# Patient Record
Sex: Male | Born: 1960 | Race: Black or African American | Hispanic: No | Marital: Single | Smoking: Former smoker
Health system: Southern US, Community
[De-identification: ages and names within clinical notes are randomized; demographics above are authoritative.]

## PROBLEM LIST (undated history)

## (undated) ENCOUNTER — Ambulatory Visit: Admission: EM | Source: Home / Self Care

## (undated) DIAGNOSIS — R7303 Prediabetes: Secondary | ICD-10-CM

## (undated) DIAGNOSIS — I4891 Unspecified atrial fibrillation: Secondary | ICD-10-CM

## (undated) DIAGNOSIS — I10 Essential (primary) hypertension: Secondary | ICD-10-CM

## (undated) DIAGNOSIS — I509 Heart failure, unspecified: Secondary | ICD-10-CM

## (undated) HISTORY — DX: Prediabetes: R73.03

## (undated) HISTORY — DX: Heart failure, unspecified: I50.9

---

## 2006-10-13 DIAGNOSIS — I4821 Permanent atrial fibrillation: Secondary | ICD-10-CM | POA: Insufficient documentation

## 2006-12-19 ENCOUNTER — Emergency Department (HOSPITAL_COMMUNITY): Admission: EM | Admit: 2006-12-19 | Discharge: 2006-12-19 | Payer: Self-pay | Admitting: Emergency Medicine

## 2011-06-05 DIAGNOSIS — R0789 Other chest pain: Secondary | ICD-10-CM | POA: Insufficient documentation

## 2011-06-05 DIAGNOSIS — I10 Essential (primary) hypertension: Secondary | ICD-10-CM | POA: Insufficient documentation

## 2011-06-05 DIAGNOSIS — I482 Chronic atrial fibrillation, unspecified: Secondary | ICD-10-CM | POA: Diagnosis present

## 2013-03-13 DIAGNOSIS — M778 Other enthesopathies, not elsewhere classified: Secondary | ICD-10-CM | POA: Insufficient documentation

## 2013-09-18 DIAGNOSIS — M25519 Pain in unspecified shoulder: Secondary | ICD-10-CM | POA: Insufficient documentation

## 2014-03-18 DIAGNOSIS — M19019 Primary osteoarthritis, unspecified shoulder: Secondary | ICD-10-CM | POA: Insufficient documentation

## 2015-01-02 DIAGNOSIS — M7542 Impingement syndrome of left shoulder: Secondary | ICD-10-CM | POA: Insufficient documentation

## 2019-11-19 DIAGNOSIS — R42 Dizziness and giddiness: Secondary | ICD-10-CM | POA: Insufficient documentation

## 2019-11-19 DIAGNOSIS — Z9189 Other specified personal risk factors, not elsewhere classified: Secondary | ICD-10-CM | POA: Insufficient documentation

## 2019-11-19 DIAGNOSIS — W010XXA Fall on same level from slipping, tripping and stumbling without subsequent striking against object, initial encounter: Secondary | ICD-10-CM | POA: Insufficient documentation

## 2021-10-21 ENCOUNTER — Other Ambulatory Visit: Payer: Self-pay

## 2021-10-21 ENCOUNTER — Emergency Department (HOSPITAL_COMMUNITY): Payer: Self-pay

## 2021-10-21 ENCOUNTER — Emergency Department (HOSPITAL_COMMUNITY)
Admission: EM | Admit: 2021-10-21 | Discharge: 2021-10-21 | Disposition: A | Discharge status: Home / Self Care | Payer: Self-pay | Attending: Emergency Medicine | Admitting: Emergency Medicine

## 2021-10-21 DIAGNOSIS — W010XXA Fall on same level from slipping, tripping and stumbling without subsequent striking against object, initial encounter: Secondary | ICD-10-CM | POA: Insufficient documentation

## 2021-10-21 DIAGNOSIS — Y92838 Other recreation area as the place of occurrence of the external cause: Secondary | ICD-10-CM | POA: Insufficient documentation

## 2021-10-21 DIAGNOSIS — M25562 Pain in left knee: Secondary | ICD-10-CM | POA: Diagnosis present

## 2021-10-21 DIAGNOSIS — M1710 Unilateral primary osteoarthritis, unspecified knee: Secondary | ICD-10-CM

## 2021-10-21 DIAGNOSIS — W19XXXA Unspecified fall, initial encounter: Secondary | ICD-10-CM

## 2021-10-21 DIAGNOSIS — M179 Osteoarthritis of knee, unspecified: Secondary | ICD-10-CM | POA: Insufficient documentation

## 2021-10-21 DIAGNOSIS — Y9301 Activity, walking, marching and hiking: Secondary | ICD-10-CM | POA: Insufficient documentation

## 2021-10-21 DIAGNOSIS — M25561 Pain in right knee: Secondary | ICD-10-CM | POA: Diagnosis not present

## 2021-10-21 MED ORDER — DICLOFENAC SODIUM 1 % EX GEL: 2.0000 g | Freq: Four times a day (QID) | CUTANEOUS | 0 refills | Status: DC

## 2021-10-21 NOTE — ED Triage Notes (Signed)
Pt reports mechanical fall onto bil knees approx 1 week ago. Reports pain to bil knees L>R. Pt also with edema to L knee.  ?

## 2021-10-21 NOTE — ED Notes (Signed)
Ortho tech paged for knee brace and crutches ?

## 2021-10-21 NOTE — Discharge Instructions (Signed)
Your history, exam, and work-up today are consistent with chronic bilateral knee arthritis that was exacerbated in the setting of acute trauma from the fall.  There was no evidence of acute fracture on imaging.  Your exam was overall reassuring but she did have more tenderness on the left side than the right.  Please use the knee sleeve and crutches and you may also use the Voltaren gel as we discussed.  Please use over-the-counter anti-inflammatory medication and follow-up with orthopedics  and PCP.  If any symptoms change or worsen acutely, please return to the nearest Emergency Department ?

## 2021-10-21 NOTE — ED Provider Triage Note (Signed)
Emergency Medicine Provider Triage Evaluation Note ? ?Riley Jefferson , a 61 y.o. male  was evaluated in triage.  Pt complains of left knee swelling.  He states that about a week and a half ago he slipped and fell on both knees.  He says that ever since then, he has had significant left knee swelling and has been hopping around.  He has had previous injury to the left knee when he was a teenager for from a basketball injury.  Initially did not come in because of some insurance problems.  He denies any fevers or chills.  He also complains of right knee pain, however it is not nearly as bad as the left side. ? ?Review of Systems  ?Positive:  ?Negative:  ? ?Physical Exam  ?BP (!) 144/99 (BP Location: Right Arm)   Pulse 86   Temp 98.2 ?F (36.8 ?C) (Oral)   Resp 18   SpO2 97%  ?Gen:   Awake, no distress   ?Resp:  Normal effort  ?MSK:   Moves extremities without difficulty  ?Other:  Left knee swelling with previous surgical scar. No redness or warmth. Tender to touch of anterior medial side. Neurovascularly intact.  ? ?Medical Decision Making  ?Medically screening exam initiated at 6:09 PM.  Appropriate orders placed.  Saul Fabiano was informed that the remainder of the evaluation will be completed by another provider, this initial triage assessment does not replace that evaluation, and the importance of remaining in the ED until their evaluation is complete. ? ? ?  ?Claudie Leach, PA-C ?10/21/21 1811 ? ?

## 2021-10-21 NOTE — ED Provider Notes (Signed)
?[facility] EMERGENCY DEPARTMENT ?Provider Note ? ? ?CSN: [id number] ?Arrival date & time: 10/21/21  1741 ? ?  ? ?History ? ?Chief Complaint  ?Patient presents with  ? Knee Pain  ? ? ?Riley Jefferson is a 61 y.o. male. ? ?The history is provided by the patient and medical records. No language interpreter was used.  ?Knee Pain ?Location:  Knee ?Time since incident:  1 week ?Injury: yes   ?Mechanism of injury: fall   ?Fall:  ?  Fall occurred:  Standing ?Knee location:  R knee and L knee ?Pain details:  ?  Quality:  Aching ?  Radiates to:  Does not radiate ?  Severity:  Moderate ?  Timing:  Constant ?  Progression:  Waxing and waning ?Chronicity:  Recurrent ?Tetanus status:  Unknown ?Prior injury to area:  No ?Relieved by:  Nothing ?Worsened by:  Bearing weight ?Ineffective treatments:  None tried ?Associated symptoms: swelling   ?Associated symptoms: no back pain, no decreased ROM, no fatigue, no fever, no itching, no muscle weakness, no neck pain, no numbness, no stiffness and no tingling   ? ?  ? ?Home Medications ?Prior to Admission medications   ?Not on File  ?   ? ?Allergies    ?Patient has no known allergies.   ? ?Review of Systems   ?Review of Systems  ?Constitutional:  Negative for chills, fatigue and fever.  ?HENT:  Negative for congestion.   ?Respiratory:  Negative for cough, chest tightness, shortness of breath and wheezing.   ?Cardiovascular:  Negative for chest pain, palpitations and leg swelling (knee swelling presnet but not calf or leg).  ?Gastrointestinal:  Negative for abdominal pain, constipation, diarrhea, nausea and vomiting.  ?Genitourinary:  Negative for flank pain and frequency.  ?Musculoskeletal:  Negative for back pain, neck pain, neck stiffness and stiffness.  ?Skin:  Negative for itching, rash and wound.  ?Neurological:  Negative for light-headedness and headaches.  ?Psychiatric/Behavioral:  Negative for agitation and confusion.   ?All other systems reviewed and are  negative. ? ?Physical Exam ?Updated Vital Signs ?BP (!) 159/78 (BP Location: Right Arm)   Pulse 67   Temp 98 ?F (36.7 ?C) (Oral)   Resp 18   Ht 6\' 4"  (1.93 m)   Wt 129.3 kg   SpO2 99%   BMI 34.69 kg/m?  ?Physical Exam ?Vitals and nursing note reviewed.  ?Constitutional:   ?   General: He is not in acute distress. ?   Appearance: He is well-developed. He is not ill-appearing, toxic-appearing or diaphoretic.  ?HENT:  ?   Head: Normocephalic and atraumatic.  ?   Nose: Nose normal.  ?   Mouth/Throat:  ?   Mouth: Mucous membranes are moist.  ?Eyes:  ?   Conjunctiva/sclera: Conjunctivae normal.  ?   Pupils: Pupils are equal, round, and reactive to light.  ?Cardiovascular:  ?   Rate and Rhythm: Normal rate and regular rhythm.  ?   Heart sounds: No murmur heard. ?Pulmonary:  ?   Effort: Pulmonary effort is normal. No respiratory distress.  ?   Breath sounds: Normal breath sounds. No wheezing, rhonchi or rales.  ?Chest:  ?   Chest wall: No tenderness.  ?Abdominal:  ?   General: Abdomen is flat.  ?   Palpations: Abdomen is soft.  ?   Tenderness: There is no abdominal tenderness. There is no right CVA tenderness, left CVA tenderness, guarding or rebound.  ?Musculoskeletal:     ?   General:  Swelling and tenderness present.  ?   Cervical back: Neck supple. No tenderness.  ?   Right knee: No swelling or deformity. Tenderness present.  ?   Left knee: Swelling present. No deformity. Tenderness present.  ?   Right lower leg: No tenderness. No edema.  ?   Left lower leg: No tenderness. No edema.  ?   Comments: Bilateral knee tenderness left worse than right.  Left knee was slightly swollen compared to right.  No lacerations seen.  No redness or warmth.  Intact sensation, strength, and pulses.  No hip tenderness.  Exam otherwise unremarkable.   ?Skin: ?   General: Skin is warm and dry.  ?   Capillary Refill: Capillary refill takes less than 2 seconds.  ?   Findings: No erythema or rash.  ?Neurological:  ?   General: No focal  deficit present.  ?   Mental Status: He is alert.  ?   Sensory: No sensory deficit.  ?   Motor: No weakness.  ?Psychiatric:     ?   Mood and Affect: Mood normal.  ? ? ?ED Results / Procedures / Treatments   ?Labs ?(all labs ordered are listed, but only abnormal results are displayed) ?Labs Reviewed - No data to display ? ?EKG ?None ? ?Radiology ?DG Knee Complete 4 Views Left ? ?Result Date: 10/21/2021 ?CLINICAL DATA:  Larey Seat, left knee pain, edema EXAM: LEFT KNEE - COMPLETE 4+ VIEW COMPARISON:  None. FINDINGS: Frontal, bilateral oblique, and lateral views of the left knee are obtained. No fracture, subluxation, or dislocation. Distal margin of a femoral intramedullary rod is identified. There is mild 3 compartmental osteoarthritis greatest in the medial compartment. No joint effusion. Soft tissues are unremarkable. IMPRESSION: 1. Mild 3 compartmental osteoarthritis. 2. No acute fracture. Electronically Signed   By: Sharlet Salina M.D.   On: 10/21/2021 19:06  ? ?DG Knee Complete 4 Views Right ? ?Result Date: 10/21/2021 ?CLINICAL DATA:  Pain, recent fall EXAM: RIGHT KNEE - COMPLETE 4+ VIEW COMPARISON:  None. FINDINGS: No recent fracture or dislocation is seen. There is no significant effusion in the suprapatellar bursa. Degenerative changes are noted with bony spurs in medial, lateral and patellofemoral compartments. There is chondrocalcinosis in the meniscal cartilages. IMPRESSION: No recent fracture or dislocation is seen in the right knee. Degenerative changes are noted with bony spurs and chondrocalcinosis. Electronically Signed   By: Ernie Avena M.D.   On: 10/21/2021 19:06   ? ?Procedures ?Procedures  ? ? ?Medications Ordered in ED ?Medications - No data to display ? ?ED Course/ Medical Decision Making/ A&P ?  ?                        ?Medical Decision Making ? ?Riley Jefferson is a 61 y.o. male with no significant past medical history who presents with bilateral knee pain after a fall.  According to patient,  but a week and a half ago he tripped fall walking in both his knees to the ground.  He reports she has had pain ever since with left worse than right.  He reports his still able to walk and denies any numbness, tingling, or weakness.  Denies any loss of bowel or bladder control.  Reports the pain is moderate to severe and he wants to make sure nothing is broken.  He has tried some over-the-counter medication to help with inflammation but has not had persistent relief.  Denies any other injuries or other. ? ?  Denies any fevers, chills, warmth, redness, or infection type symptoms in the skin or extremities.  Denies any chest pain, shortness of breath, cough, constipation, diarrhea, or urinary symptoms. ? ?On exam, patient does have tenderness to bilateral knees left worse than right.  He reports he is able to ambulate.  He did have more pain with active knee movement as compared to passive movement.  It was not warm.  No erythema.  Intact sensation, strength, and pulses distally.  Hips nontender.  Lungs clear and chest nontender.  Exam otherwise unremarkable.  No tenderness in the calves or swelling seen distally. ? ?Patient had x-rays in triage which showed no acute fractures.  Arthritis seen in both knees.  Based on his exam, low suspicion for DVTs at this time. ? ?We had a shared decision-making conversation.  Given about 10 days since his injury with her symptoms symptoms, we do feel is reasonable to have him follow-up with an orthopedist.  We also felt it was reasonable to give a prescription for some Voltaren gel to try and help with the inflammation and pain. ? ?Patient felt this was reasonable plan and he will rest on them and follow-up.  We also give him a knee sleeve and crutches to try to stay off the left knee as it is hurting worse. ? ?He understood return precautions and follow-up instructions and had no other questions or concerns.  Patient discharged in good condition. ? ? ? ? ? ? ? ? ?Final Clinical  Impression(s) / ED Diagnoses ?Final diagnoses:  ?Acute pain of both knees  ?Fall, initial encounter  ?Arthritis of knee  ? ? ?Rx / DC Orders ?ED Discharge Orders   ? ?      Ordered  ?  diclofenac Sodium (VOLTAREN) 1 % G

## 2021-10-21 NOTE — Progress Notes (Signed)
Orthopedic Tech Progress Note ?Patient Details:  ?Riley Jefferson ?May 04, 1961 ?[id number] ? ?Ortho Devices ?Type of Ortho Device: Knee Sleeve, Crutches ?Ortho Device/Splint Location: lle ?Ortho Device/Splint Interventions: Ordered ? Dropped brace off in pt rm pt didn't want me to apply the knee brace said he will do it. ?  ? ?Edwina Barth ?10/21/2021, 8:39 PM ? ?

## 2022-03-23 ENCOUNTER — Encounter (HOSPITAL_BASED_OUTPATIENT_CLINIC_OR_DEPARTMENT_OTHER): Payer: Self-pay

## 2022-03-23 DIAGNOSIS — I10 Essential (primary) hypertension: Secondary | ICD-10-CM | POA: Insufficient documentation

## 2022-03-23 NOTE — ED Notes (Signed)
Attempted to draw blood from pt x 2 unsuccessful

## 2022-03-23 NOTE — ED Triage Notes (Signed)
States he was at work had a nosebleed and was found to be hypertensive, [facility] and was sent here for further evaluation. Nose bleed has stopped  States he is not [facility] now and admits to stopping his BP meds

## 2022-03-24 ENCOUNTER — Emergency Department (HOSPITAL_BASED_OUTPATIENT_CLINIC_OR_DEPARTMENT_OTHER)
Admission: EM | Admit: 2022-03-24 | Discharge: 2022-03-24 | Disposition: A | Discharge status: Home / Self Care | Payer: Medicaid - Out of State | Attending: Emergency Medicine | Admitting: Emergency Medicine

## 2022-03-24 DIAGNOSIS — R040 Epistaxis: Secondary | ICD-10-CM

## 2022-03-24 DIAGNOSIS — I10 Essential (primary) hypertension: Secondary | ICD-10-CM

## 2022-03-24 HISTORY — DX: Essential (primary) hypertension: I10

## 2022-03-24 HISTORY — DX: Unspecified atrial fibrillation: I48.91

## 2022-03-24 MED ORDER — METOPROLOL SUCCINATE ER 50 MG PO TB24: 50.0000 mg | ORAL_TABLET | Freq: Every day | ORAL | 0 refills | Status: DC

## 2022-03-24 MED ORDER — LISINOPRIL 10 MG PO TABS: 10.0000 mg | ORAL_TABLET | Freq: Every day | ORAL | 0 refills | Status: DC

## 2022-03-24 NOTE — Discharge Instructions (Signed)
Begin taking lisinopril and metoprolol as previously prescribed.  Refills of these medications have been provided during this ER visit.  Keep a record of your blood pressures and take this with you to you your next primary doctor's appointment.  Return to the ER if symptoms significantly worsen or change.

## 2022-03-24 NOTE — ED Provider Notes (Signed)
[facility] EMERGENCY DEPT Provider Note   CSN: [id number] Arrival date & time: 03/23/22  1951     History  Chief Complaint  Patient presents with   Hypertension    Riley Jefferson is a 61 y.o. male.  Patient is a 61 year old male with history of hypertension presenting with complaints of nosebleed and elevated blood pressure.  He was at work this evening when his nose began to bleed in the absence of any injury or trauma.  It bled for approximately 1 minute, then he was able to get it stopped by putting a tissue in his nose.  He checked his blood pressure and it was 175/100.  His boss urged him to come to the ER to be evaluated.  Patient denies to me he is having any chest pain.  He has no other complaints.  He does report being off of his blood pressure medications for the past 2 months since relocating here from [facility] and not having a primary doctor in the area.  The history is provided by the patient.       Home Medications Prior to Admission medications   Medication Sig Start Date End Date Taking? Authorizing Provider  diclofenac Sodium (VOLTAREN) 1 % GEL Apply 2 g topically 4 (four) times daily. 10/21/21   Tegeler, Canary Brim, MD      Allergies    Patient has no known allergies.    Review of Systems   Review of Systems  All other systems reviewed and are negative.   Physical Exam Updated Vital Signs BP (!) 154/105   Pulse 70   Temp 97.6 F (36.4 C)   Resp 18   Ht 6\' 4"  (1.93 m)   Wt 131.5 kg   SpO2 100%   BMI 35.30 kg/m  Physical Exam Vitals and nursing note reviewed.  Constitutional:      General: He is not in acute distress.    Appearance: He is well-developed. He is not diaphoretic.  HENT:     Head: Normocephalic and atraumatic.     Nose:     Comments: Nares are clear.  Septum is midline.  There is no bleeding. Cardiovascular:     Rate and Rhythm: Normal rate and regular rhythm.     Heart sounds: No murmur heard.    No friction  rub.  Pulmonary:     Effort: Pulmonary effort is normal. No respiratory distress.     Breath sounds: Normal breath sounds. No wheezing or rales.  Abdominal:     General: Bowel sounds are normal. There is no distension.     Palpations: Abdomen is soft.     Tenderness: There is no abdominal tenderness.  Musculoskeletal:        General: Normal range of motion.     Cervical back: Normal range of motion and neck supple.  Skin:    General: Skin is warm and dry.  Neurological:     Mental Status: He is alert and oriented to person, place, and time.     Coordination: Coordination normal.     ED Results / Procedures / Treatments   Labs (all labs ordered are listed, but only abnormal results are displayed) Labs Reviewed  CBC WITH DIFFERENTIAL/PLATELET  COMPREHENSIVE METABOLIC PANEL  TROPONIN I (HIGH SENSITIVITY)  TROPONIN I (HIGH SENSITIVITY)    EKG EKG Interpretation  Date/Time:  Monday March 23 2022 20:30:43 EDT Ventricular Rate:  87 PR Interval:    QRS Duration: 124 QT Interval:  386 QTC Calculation:  464 R Axis:   -14 Text Interpretation: Atrial fibrillation Left ventricular hypertrophy with QRS widening Cannot rule out Septal infarct , age undetermined Abnormal ECG Confirmed by Geoffery Lyons ([id number]) on 03/24/2022 2:16:30 AM  Radiology No results found.  Procedures Procedures    Medications Ordered in ED Medications - No data to display  ED Course/ Medical Decision Making/ A&P  Patient presenting here with complaints of nosebleed and elevated blood pressure.  He has been here for over 6 hours and has had no further bleeding.  Most recent blood pressure is 154/105.  I do not feel as though any work-up is indicated at this time.  Patient will be discharged with prescriptions for his metoprolol and lisinopril he has not been taking for the past 2 months.  He is to follow-up with a primary doctor locally and return to the ER for any problems.  Final Clinical Impression(s)  / ED Diagnoses Final diagnoses:  None    Rx / DC Orders ED Discharge Orders     None         Geoffery Lyons, MD 03/24/22 [phone]

## 2022-03-28 IMAGING — DX DG KNEE COMPLETE 4+V*L*
4 series · 4 of 4 positions shown · non-contrast
Comparison: None.

CLINICAL DATA: Fell, left knee pain, edema

EXAM:
LEFT KNEE - COMPLETE 4+ VIEW

[t knee ap left (1 of 2)]
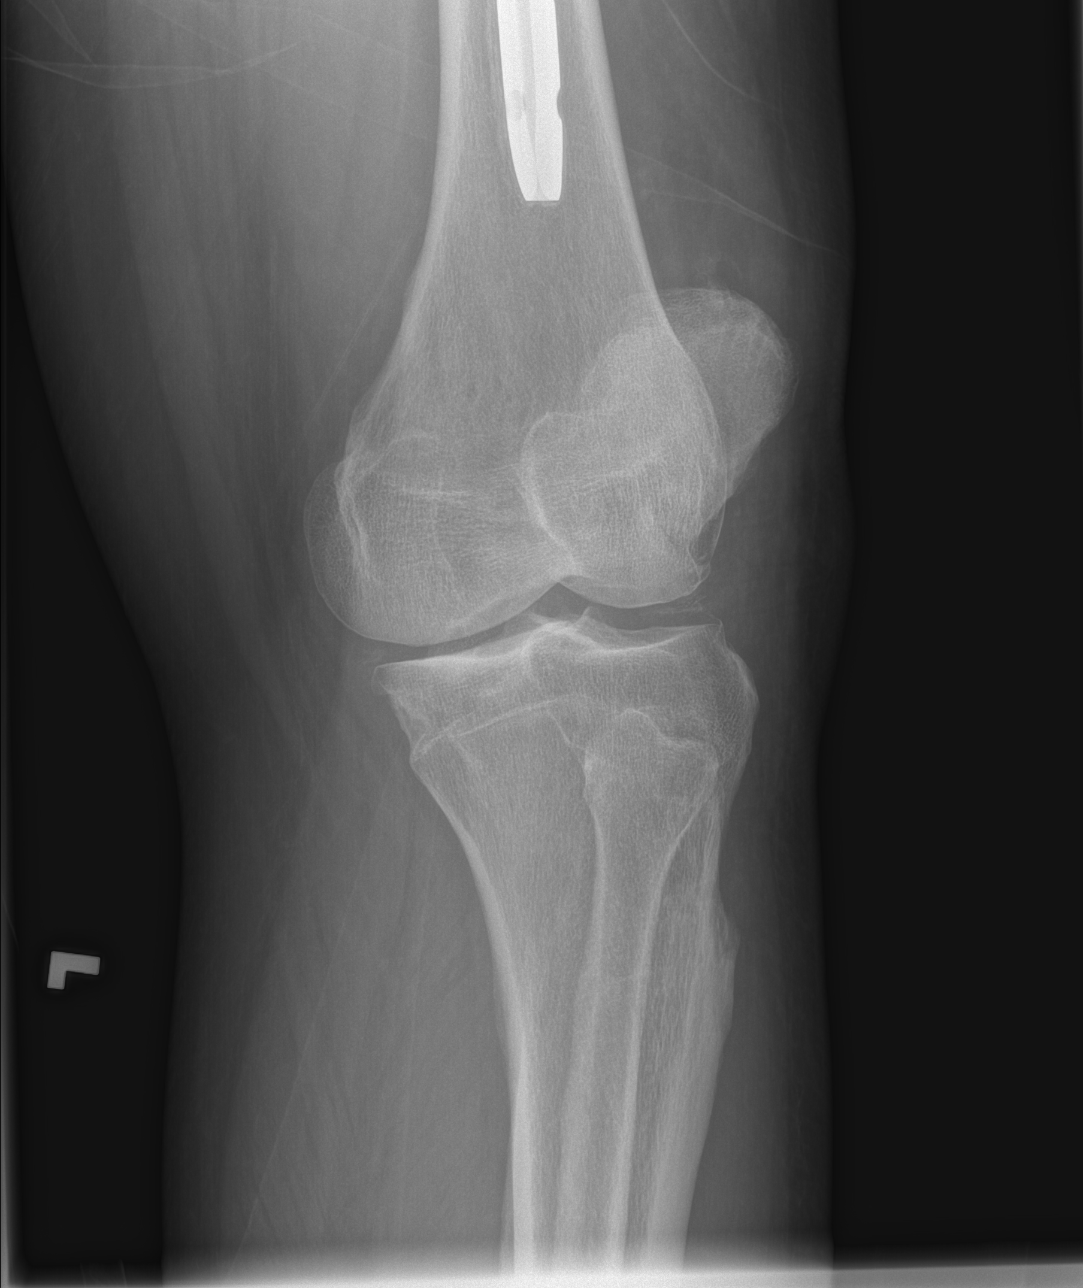

[t knee ap left (2 of 2)]
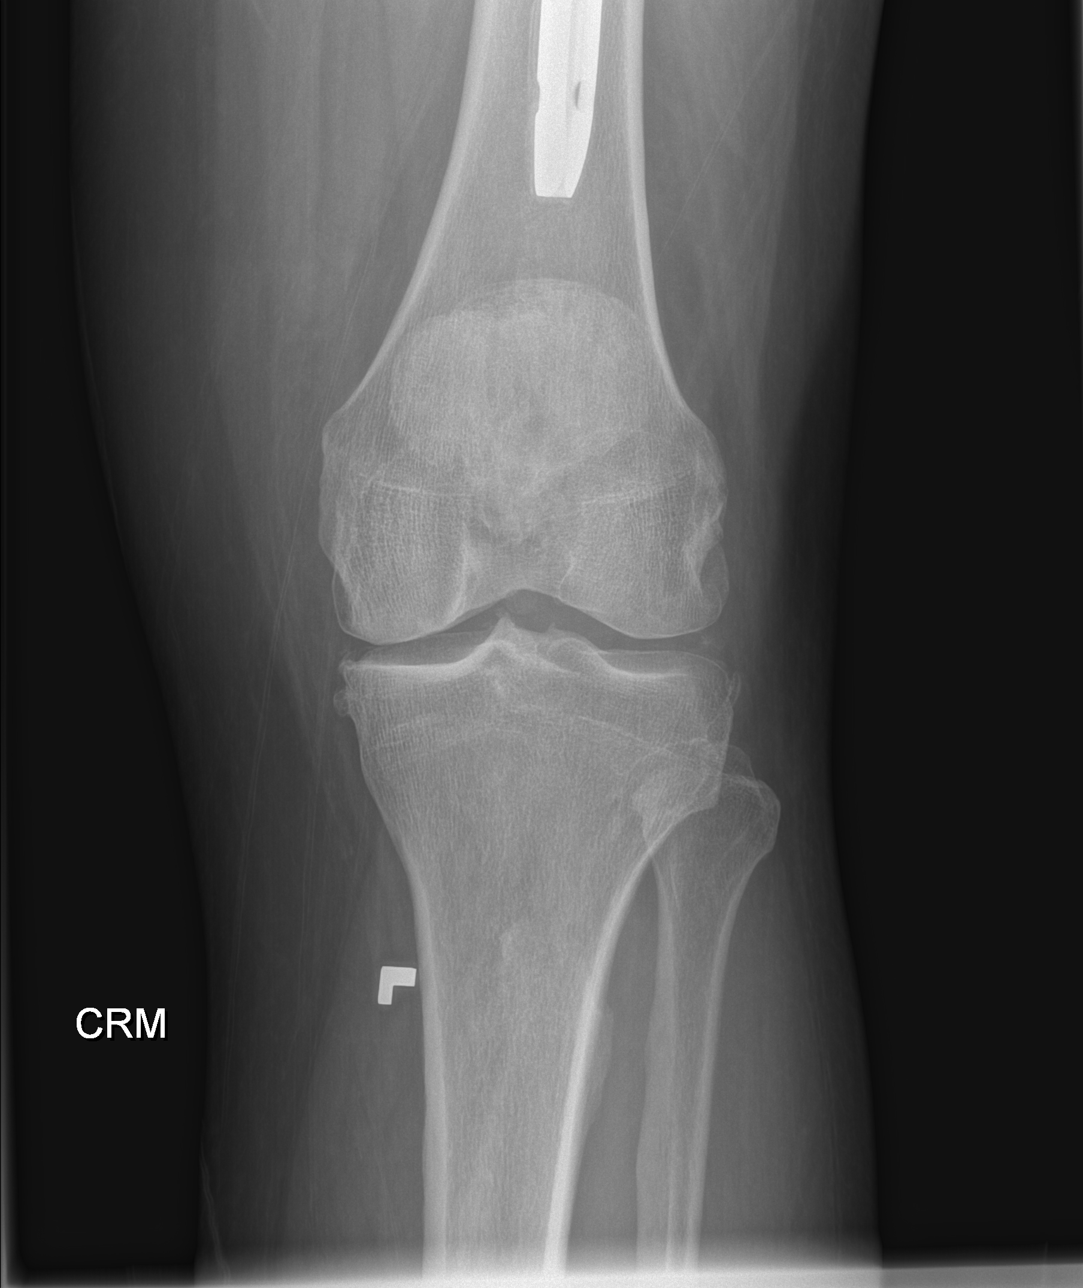

[t knee obl left]
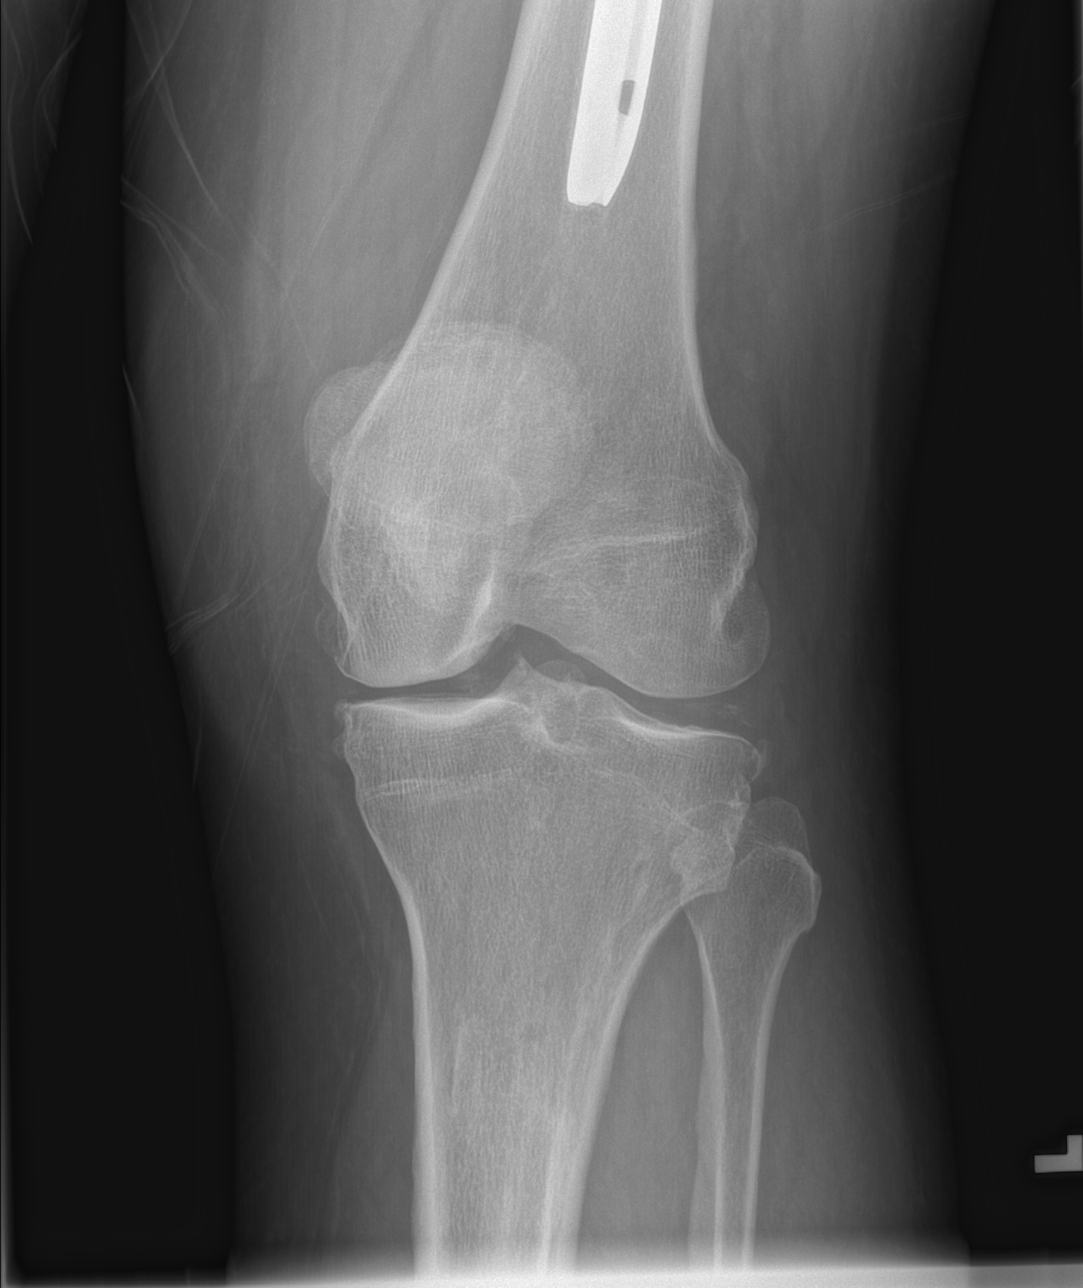

[t knee lat left]
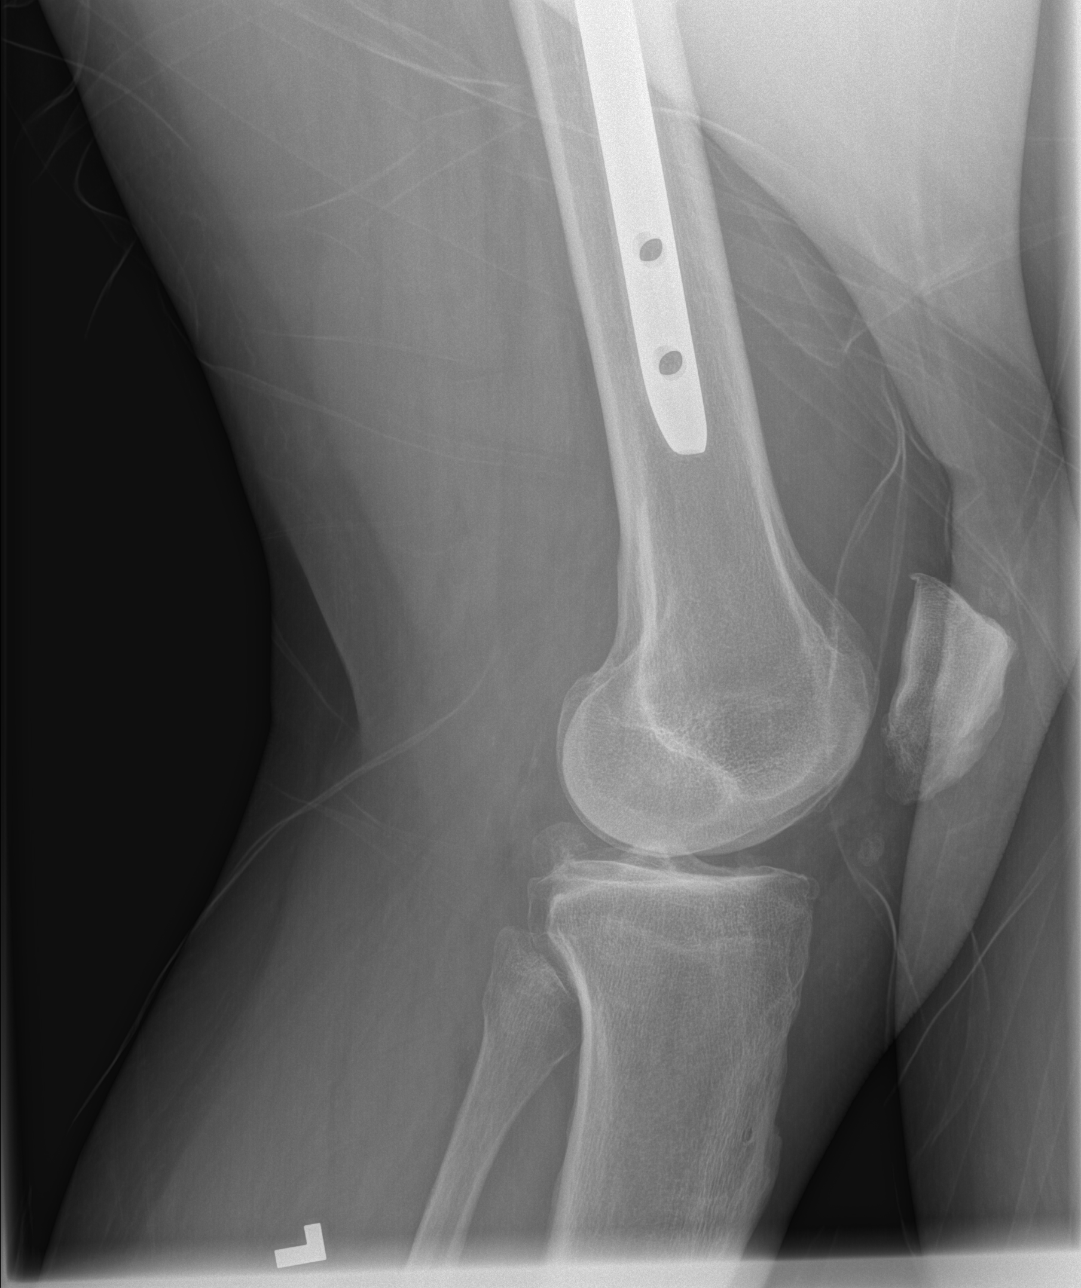

[4 of 4 positions shown; findings below may reference images not displayed]

FINDINGS: Frontal, bilateral oblique, and lateral views of the left knee are
obtained. No fracture, subluxation, or dislocation. Distal margin of
a femoral intramedullary rod is identified. There is mild 3
compartmental osteoarthritis greatest in the medial compartment. No
joint effusion. Soft tissues are unremarkable.
IMPRESSION: 1. Mild 3 compartmental osteoarthritis.
2. No acute fracture.

## 2022-03-28 IMAGING — DX DG KNEE COMPLETE 4+V*R*
4 series · 4 of 4 positions shown · non-contrast
Comparison: None.

CLINICAL DATA: Pain, recent fall

EXAM:
RIGHT KNEE - COMPLETE 4+ VIEW

[t knee ap right]
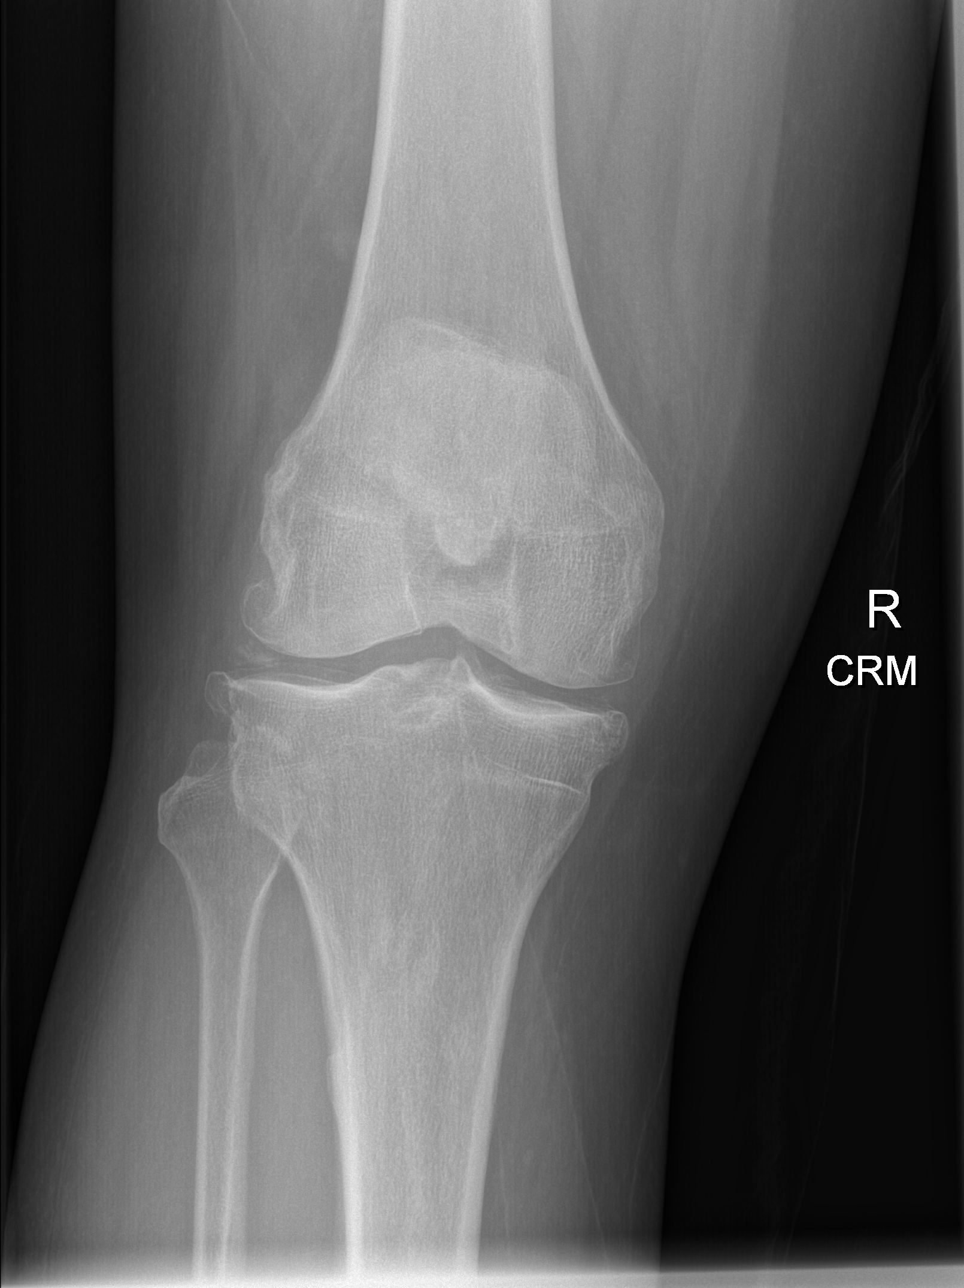

[t knee obl right (1 of 2)]
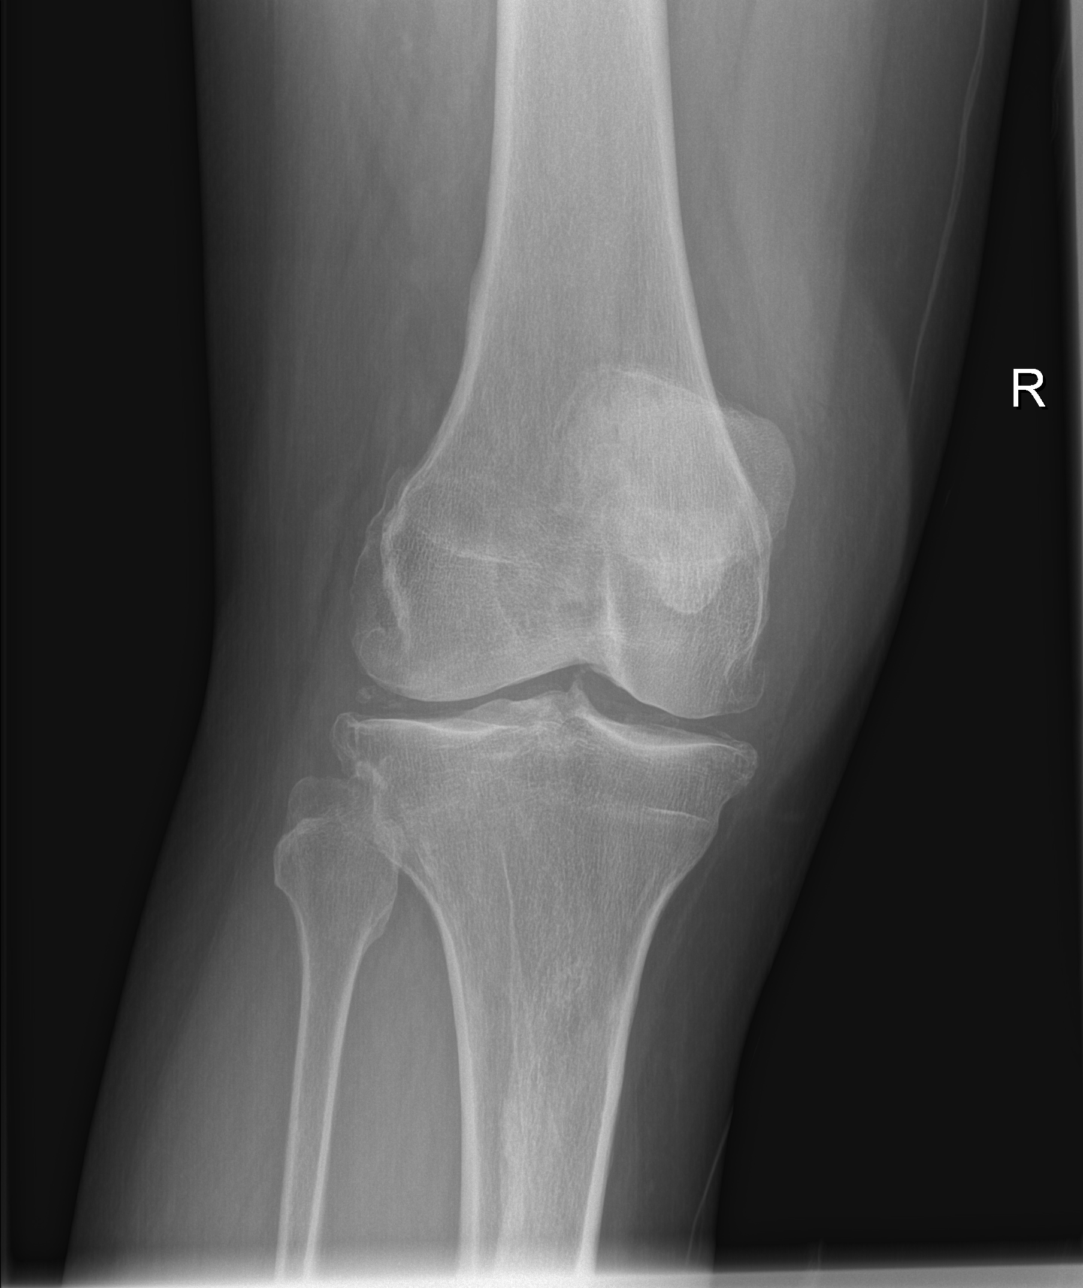

[t knee obl right (2 of 2)]
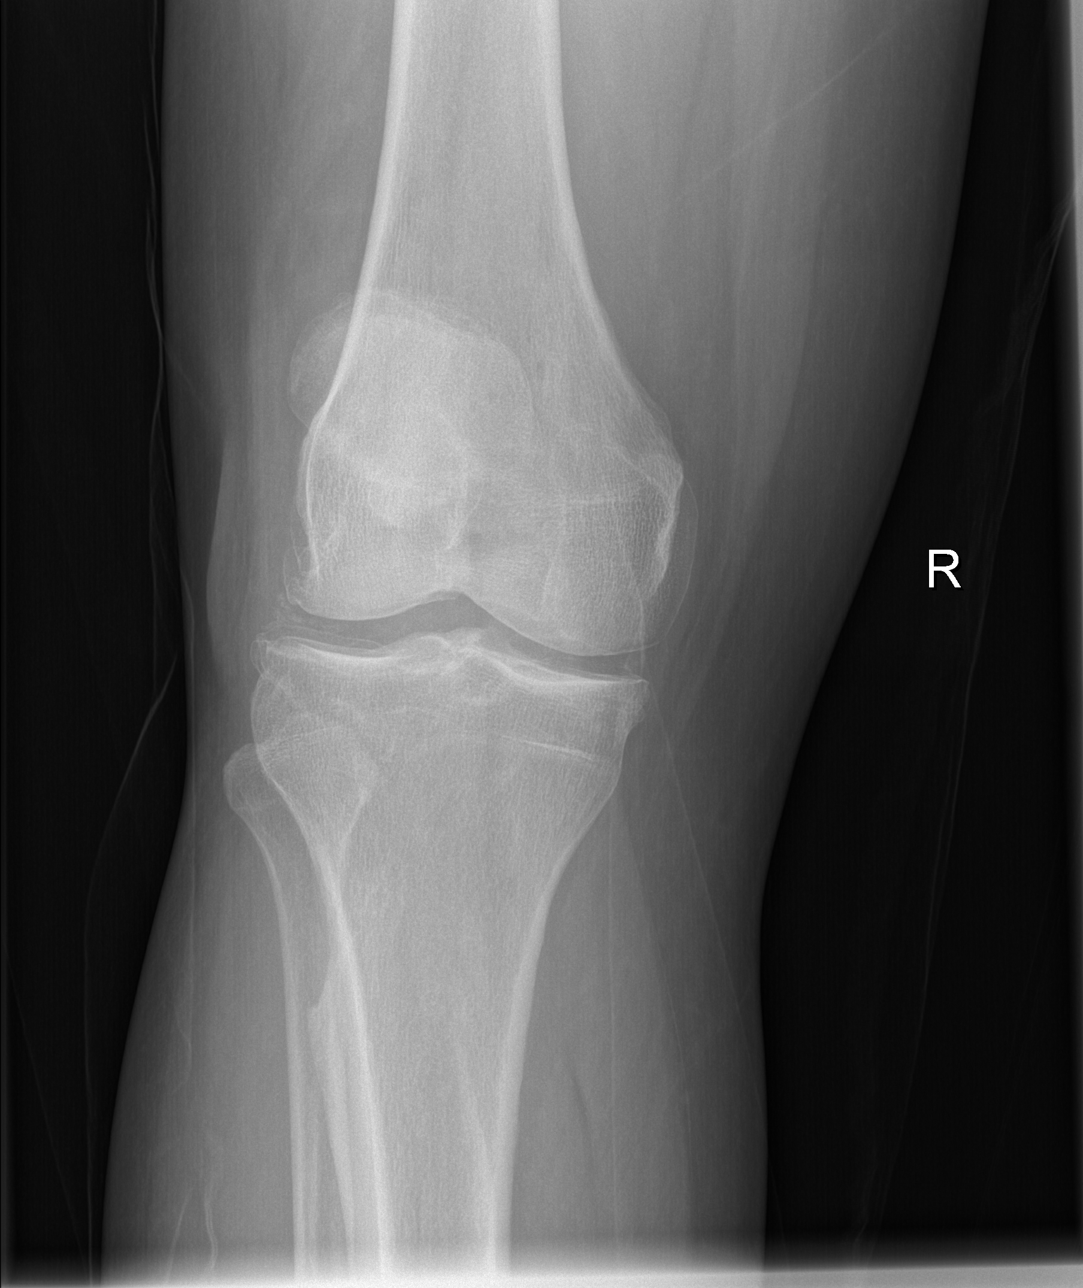

[t knee lat right]
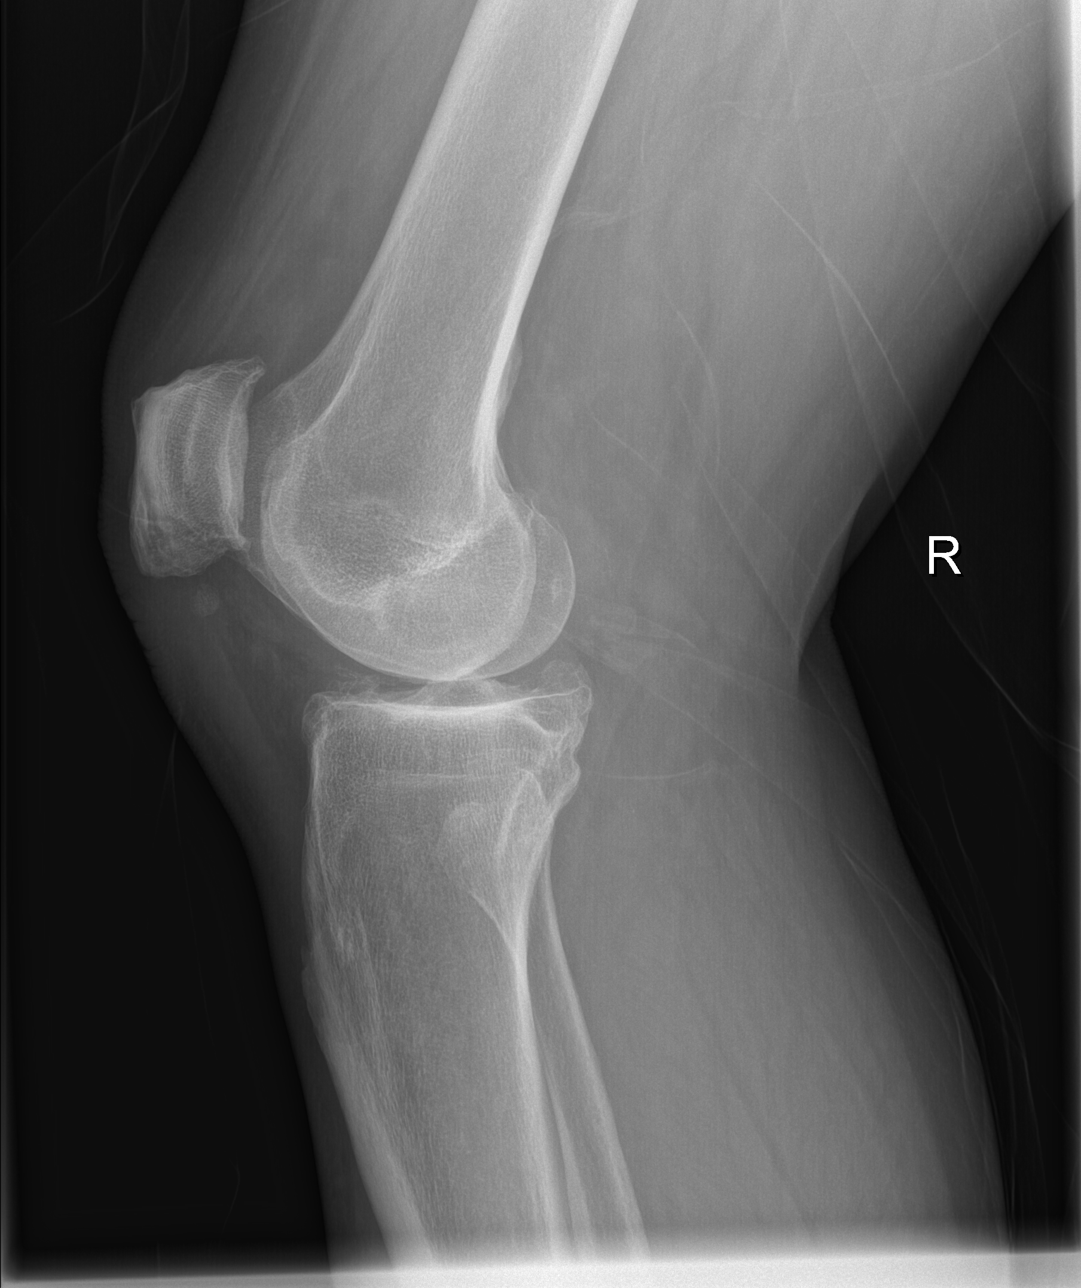

[4 of 4 positions shown; findings below may reference images not displayed]

FINDINGS: No recent fracture or dislocation is seen. There is no significant
effusion in the suprapatellar bursa. Degenerative changes are noted
with bony spurs in medial, lateral and patellofemoral compartments.
There is chondrocalcinosis in the meniscal cartilages.
IMPRESSION: No recent fracture or dislocation is seen in the right knee.
Degenerative changes are noted with bony spurs and
chondrocalcinosis.

## 2022-06-08 ENCOUNTER — Other Ambulatory Visit: Payer: Self-pay

## 2022-06-08 ENCOUNTER — Inpatient Hospital Stay (HOSPITAL_COMMUNITY)
Admission: EM | Admit: 2022-06-08 | Discharge: 2022-06-10 | DRG: 309 | Disposition: A | Discharge status: Home / Self Care | Payer: Medicaid Other | Attending: Internal Medicine | Admitting: Internal Medicine

## 2022-06-08 ENCOUNTER — Emergency Department (HOSPITAL_COMMUNITY): Payer: Self-pay

## 2022-06-08 DIAGNOSIS — E8809 Other disorders of plasma-protein metabolism, not elsewhere classified: Secondary | ICD-10-CM

## 2022-06-08 DIAGNOSIS — R9431 Abnormal electrocardiogram [ECG] [EKG]: Secondary | ICD-10-CM | POA: Diagnosis present

## 2022-06-08 DIAGNOSIS — R001 Bradycardia, unspecified: Secondary | ICD-10-CM | POA: Insufficient documentation

## 2022-06-08 DIAGNOSIS — E11649 Type 2 diabetes mellitus with hypoglycemia without coma: Secondary | ICD-10-CM | POA: Diagnosis present

## 2022-06-08 DIAGNOSIS — E872 Acidosis, unspecified: Secondary | ICD-10-CM | POA: Insufficient documentation

## 2022-06-08 DIAGNOSIS — Z87891 Personal history of nicotine dependence: Secondary | ICD-10-CM

## 2022-06-08 DIAGNOSIS — I429 Cardiomyopathy, unspecified: Secondary | ICD-10-CM | POA: Diagnosis present

## 2022-06-08 DIAGNOSIS — E878 Other disorders of electrolyte and fluid balance, not elsewhere classified: Secondary | ICD-10-CM | POA: Diagnosis present

## 2022-06-08 DIAGNOSIS — E876 Hypokalemia: Secondary | ICD-10-CM | POA: Diagnosis not present

## 2022-06-08 DIAGNOSIS — I4819 Other persistent atrial fibrillation: Principal | ICD-10-CM | POA: Diagnosis present

## 2022-06-08 DIAGNOSIS — R0789 Other chest pain: Secondary | ICD-10-CM | POA: Insufficient documentation

## 2022-06-08 DIAGNOSIS — I5022 Chronic systolic (congestive) heart failure: Secondary | ICD-10-CM | POA: Diagnosis present

## 2022-06-08 DIAGNOSIS — Z79899 Other long term (current) drug therapy: Secondary | ICD-10-CM

## 2022-06-08 DIAGNOSIS — D649 Anemia, unspecified: Secondary | ICD-10-CM | POA: Insufficient documentation

## 2022-06-08 DIAGNOSIS — Z823 Family history of stroke: Secondary | ICD-10-CM

## 2022-06-08 DIAGNOSIS — Z6836 Body mass index (BMI) 36.0-36.9, adult: Secondary | ICD-10-CM

## 2022-06-08 DIAGNOSIS — I10 Essential (primary) hypertension: Secondary | ICD-10-CM | POA: Insufficient documentation

## 2022-06-08 DIAGNOSIS — E669 Obesity, unspecified: Secondary | ICD-10-CM | POA: Diagnosis present

## 2022-06-08 DIAGNOSIS — I482 Chronic atrial fibrillation, unspecified: Secondary | ICD-10-CM | POA: Diagnosis present

## 2022-06-08 DIAGNOSIS — F410 Panic disorder [episodic paroxysmal anxiety] without agoraphobia: Secondary | ICD-10-CM | POA: Diagnosis present

## 2022-06-08 DIAGNOSIS — R0602 Shortness of breath: Secondary | ICD-10-CM

## 2022-06-08 DIAGNOSIS — D696 Thrombocytopenia, unspecified: Secondary | ICD-10-CM | POA: Insufficient documentation

## 2022-06-08 DIAGNOSIS — E162 Hypoglycemia, unspecified: Secondary | ICD-10-CM | POA: Insufficient documentation

## 2022-06-08 DIAGNOSIS — G4730 Sleep apnea, unspecified: Secondary | ICD-10-CM | POA: Diagnosis present

## 2022-06-08 DIAGNOSIS — I4891 Unspecified atrial fibrillation: Secondary | ICD-10-CM | POA: Insufficient documentation

## 2022-06-08 DIAGNOSIS — I110 Hypertensive heart disease with heart failure: Secondary | ICD-10-CM | POA: Diagnosis present

## 2022-06-08 DIAGNOSIS — R079 Chest pain, unspecified: Secondary | ICD-10-CM | POA: Insufficient documentation

## 2022-06-08 LAB — CBC WITH DIFFERENTIAL/PLATELET
Abs Immature Granulocytes: 0.02 10*3/uL (ref 0.00–0.07)
Basophils Absolute: 0 10*3/uL (ref 0.0–0.1)
Basophils Relative: 1 %
Eosinophils Absolute: 0.1 10*3/uL (ref 0.0–0.5)
Eosinophils Relative: 3 %
HCT: 28.4 % — ABNORMAL LOW (ref 39.0–52.0)
Hemoglobin: 9.3 g/dL — ABNORMAL LOW (ref 13.0–17.0)
Immature Granulocytes: 1 %
Lymphocytes Relative: 40 %
Lymphs Abs: 1.7 10*3/uL (ref 0.7–4.0)
MCH: 28.5 pg (ref 26.0–34.0)
MCHC: 32.7 g/dL (ref 30.0–36.0)
MCV: 87.1 fL (ref 80.0–100.0)
Monocytes Absolute: 0.5 10*3/uL (ref 0.1–1.0)
Monocytes Relative: 12 %
Neutro Abs: 1.9 10*3/uL (ref 1.7–7.7)
Neutrophils Relative %: 43 %
Platelets: 132 10*3/uL — ABNORMAL LOW (ref 150–400)
RBC: 3.26 MIL/uL — ABNORMAL LOW (ref 4.22–5.81)
RDW: 16 % — ABNORMAL HIGH (ref 11.5–15.5)
WBC: 4.3 10*3/uL (ref 4.0–10.5)
nRBC: 0 % (ref 0.0–0.2)

## 2022-06-08 LAB — COMPREHENSIVE METABOLIC PANEL
ALT: 11 U/L (ref 0–44)
AST: 12 U/L — ABNORMAL LOW (ref 15–41)
Albumin: 1.8 g/dL — ABNORMAL LOW (ref 3.5–5.0)
Alkaline Phosphatase: 40 U/L (ref 38–126)
Anion gap: 4 — ABNORMAL LOW (ref 5–15)
BUN: 11 mg/dL (ref 8–23)
CO2: 13 mmol/L — ABNORMAL LOW (ref 22–32)
Calcium: 4.9 mg/dL — CL (ref 8.9–10.3)
Chloride: 126 mmol/L — ABNORMAL HIGH (ref 98–111)
Creatinine, Ser: 0.56 mg/dL — ABNORMAL LOW (ref 0.61–1.24)
GFR, Estimated: >60 mL/min (ref >60)
Glucose, Bld: 62 mg/dL — ABNORMAL LOW (ref 70–99)
Potassium: 2.1 mmol/L — CL (ref 3.5–5.1)
Sodium: 143 mmol/L (ref 135–145)
Total Bilirubin: 0.3 mg/dL (ref 0.3–1.2)
Total Protein: 3.6 g/dL — ABNORMAL LOW (ref 6.5–8.1)

## 2022-06-08 LAB — TROPONIN I (HIGH SENSITIVITY): Troponin I (High Sensitivity): 6 ng/L (ref <18)

## 2022-06-08 LAB — BRAIN NATRIURETIC PEPTIDE: B Natriuretic Peptide: 45.8 pg/mL (ref 0.0–100.0)

## 2022-06-08 NOTE — ED Provider Notes (Signed)
[facility] EMERGENCY DEPARTMENT Provider Note   CSN: [id number] Arrival date & time: 06/08/22  1709     History {Add pertinent medical, surgical, social history, OB history to HPI:1} Chief Complaint  Patient presents with   Shortness of Breath    Riley Jefferson is a 61 y.o. male.  Riley Jefferson is a 61 year old male with history of hypertension, persistent atrial fibrillation, arthritis who presents to the emergency department for shortness of breath.  The patient states that around 4 PM.  He states that his chest felt tight, he started breathing hard, and he felt like his heart was beating fast.  He said that he was very upset about something in particular at the time.  He states that he has had anxiety attacks in the past, although this one was worse today.  He reports that he is always on atrial fibrillation.  He was previously on anticoagulation but this was taken off due to bleeding problems.  He currently takes metoprolol.  He is denies any recent fevers, cough.  He states that he feels better at this time, currently has no chest pain, just feels a little bit tired.  He has noticed some swelling in both of his legs slightly.  The history is provided by the patient and medical records.  Shortness of Breath      Home Medications Prior to Admission medications   Medication Sig Start Date End Date Taking? Authorizing Provider  diclofenac Sodium (VOLTAREN) 1 % GEL Apply 2 g topically 4 (four) times daily. 10/21/21   Tegeler, Canary Brim, MD  lisinopril (ZESTRIL) 10 MG tablet Take 1 tablet (10 mg total) by mouth daily. 03/24/22   Geoffery Lyons, MD  metoprolol succinate (TOPROL XL) 50 MG 24 hr tablet Take 1 tablet (50 mg total) by mouth daily. Take with or immediately following a meal. 03/24/22   Geoffery Lyons, MD      Allergies    Patient has no known allergies.    Review of Systems   Review of Systems  Respiratory:  Positive for shortness of breath.      Physical Exam Updated Vital Signs BP (!) 140/93 (BP Location: Right Arm)   Pulse 74   Resp 16   SpO2 97%   Physical Exam Vitals and nursing note reviewed.  Constitutional:      General: He is not in acute distress.    Appearance: He is not toxic-appearing.  HENT:     Head: Normocephalic and atraumatic.  Eyes:     Extraocular Movements: Extraocular movements intact.  Cardiovascular:     Rate and Rhythm: Normal rate. Rhythm irregular.  Pulmonary:     Effort: Pulmonary effort is normal.     Breath sounds: No wheezing.     Comments: ***crackles in bilateral bases Abdominal:     Palpations: Abdomen is soft.     Tenderness: There is no abdominal tenderness.  Musculoskeletal:     Cervical back: Normal range of motion.     Right lower leg: No tenderness. Edema present.     Left lower leg: No tenderness. Edema present.     Comments: Trace bilateral lower extremity edema.  Skin:    General: Skin is warm and dry.  Neurological:     General: No focal deficit present.     Mental Status: He is alert and oriented to person, place, and time.  Psychiatric:        Mood and Affect: Mood normal.  ED Results / Procedures / Treatments   Labs (all labs ordered are listed, but only abnormal results are displayed) Labs Reviewed  CBC WITH DIFFERENTIAL/PLATELET  COMPREHENSIVE METABOLIC PANEL  TROPONIN I (HIGH SENSITIVITY)    EKG None  Radiology No results found.  Procedures Procedures  {Document cardiac monitor, telemetry assessment procedure when appropriate:1}  Medications Ordered in ED Medications - No data to display  ED Course/ Medical Decision Making/ A&P                           Medical Decision Making Amount and/or Complexity of Data Reviewed Labs: ordered.   Brief Overview Riley Jefferson is a 61 year old male with history of hypertension, persistent atrial fibrillation, arthritis who presents to the emergency department for shortness of breath.    I  have reviewed the nursing documentation for past medical history, family history, and social history and agree.  I have reviewed the patient's vital signs. There are ***no abnormalities***.  Per triage, patient was noted to have an episode of symptomatic bradycardia with some shortness of breath. ***  Initial Differential Diagnoses: I do not believe the shortness of breath is due to a COPD exacerbation, viral respiratory illness, influenza, or pneumonia based on the patient's history and physical exam. The patient has no history of anaphylaxis, no urticaria or edema on exam, and no hypotension to suggest anaphylaxis.   Differential also included ACS, but given patient's lack of history, unremarkable ECG, and absence of chest pain, I feel this is unlikely.  Feel pulmonary embolism unlikely as patient has no history of DVT/PE, no recent trauma or surgery, no hemoptysis, no signs of DVT on exam, and no tachycardia. Patient has no urticarial rash, or 2 system involvement or wheezing on exam to suggest anaphylactic reaction.  Oropharynx is clear.  Given patient's ***lack of JVD, peripheral edema, or crackles/rales on exam, as well as no evidence of pulmonary edema/congestive heart failure on CXR, I doubt that this is a CHF exacerbation.  Initial concern highest for possible CHF exacerbation versus anxiety attack.    Testing: *** have been ordered to evaluate the patient.  Interventions: These medications and interventions were provided for the patient while in the ED. @EDMEDS2 @  Testing Results: Labs revealed: ***  Imaging revealed: CXR - ***  ***I interpreted the ECG. It reveals a sinus rhythm. The QTc, PR, and QRS are appropriate. There are no signs of acute ischemia or of significant electrical abnormalities. The ECG does not show a STEMI. There are no ST depressions. ***There are no T wave inversions. There is no evidence of a High-Grade Conduction Block, WPW, Brugada Sign, ARVC, DeWinters T  Waves, or Wellens Waves.    Final Differential: The patient's presentation is most consistent with ***.   Post-ED Care: ***I believe the patient requires admission for further care and management. The patient was admitted to ***.  ***Rochele Pages is critically-ill and requires intensive care and was admitted to ***.   ***I believe that the patient is safe for discharge home with outpatient followup. We discussed following up ***. I provided thorough ED return precautions. *** feel*** safe and comfortable with this plan.   {Document critical care time when appropriate:1} {Document review of labs and clinical decision tools ie heart score, Chads2Vasc2 etc:1}  {Document your independent review of radiology images, and any outside records:1} {Document your discussion with family members, caretakers, and with consultants:1} {Document social determinants of health affecting pt's  care:1} {Document your decision making why or why not admission, treatments were needed:1} Final Clinical Impression(s) / ED Diagnoses Final diagnoses:  None    Rx / DC Orders ED Discharge Orders     None

## 2022-06-08 NOTE — ED Notes (Signed)
Unable to collect blood work. Attempt by 2 RN's. MD Lajean Silvius made aware.

## 2022-06-08 NOTE — ED Triage Notes (Signed)
Pt had sudden onset while at work doing non-exertional activity. 98% SpO2 room air. Denies chest pain. Reports fatigue with exertion.

## 2022-06-08 NOTE — ED Notes (Signed)
MD Jeraldine Loots made aware of potassium 2.1 and calcium 4.9.

## 2022-06-08 NOTE — ED Notes (Signed)
Pt requesting to leave. MD Jeraldine Loots made aware.

## 2022-06-08 NOTE — ED Provider Triage Note (Signed)
Emergency Medicine Provider Triage Evaluation Note  Rockne Dearinger , a 61 y.o. male  was evaluated in triage.  Pt complains of shortness of breath  Review of Systems  Positive: weakness Negative: fever  Physical Exam  BP (!) 140/93 (BP Location: Right Arm)   Pulse 74   Resp 16   SpO2 97%  Gen:   Awake, no distress   Resp:  Normal effort  MSK:   Moves extremities without difficulty  Other:    Medical Decision Making  Medically screening exam initiated at 7:03 PM.  Appropriate orders placed.  Shamon Cothran was informed that the remainder of the evaluation will be completed by another provider, this initial triage assessment does not replace that evaluation, and the importance of remaining in the ED until their evaluation is complete.     Elson Areas, [place] 06/08/22 1904

## 2022-06-09 ENCOUNTER — Encounter (HOSPITAL_COMMUNITY): Payer: Self-pay | Admitting: Internal Medicine

## 2022-06-09 ENCOUNTER — Observation Stay (HOSPITAL_COMMUNITY): Payer: Self-pay

## 2022-06-09 DIAGNOSIS — G4730 Sleep apnea, unspecified: Secondary | ICD-10-CM | POA: Diagnosis present

## 2022-06-09 DIAGNOSIS — E8809 Other disorders of plasma-protein metabolism, not elsewhere classified: Secondary | ICD-10-CM | POA: Diagnosis present

## 2022-06-09 DIAGNOSIS — R079 Chest pain, unspecified: Secondary | ICD-10-CM | POA: Insufficient documentation

## 2022-06-09 DIAGNOSIS — I4819 Other persistent atrial fibrillation: Secondary | ICD-10-CM | POA: Diagnosis not present

## 2022-06-09 DIAGNOSIS — E876 Hypokalemia: Secondary | ICD-10-CM

## 2022-06-09 DIAGNOSIS — E11649 Type 2 diabetes mellitus with hypoglycemia without coma: Secondary | ICD-10-CM | POA: Diagnosis present

## 2022-06-09 DIAGNOSIS — I509 Heart failure, unspecified: Secondary | ICD-10-CM

## 2022-06-09 DIAGNOSIS — D649 Anemia, unspecified: Secondary | ICD-10-CM | POA: Diagnosis present

## 2022-06-09 DIAGNOSIS — E872 Acidosis, unspecified: Secondary | ICD-10-CM | POA: Diagnosis not present

## 2022-06-09 DIAGNOSIS — I10 Essential (primary) hypertension: Secondary | ICD-10-CM | POA: Insufficient documentation

## 2022-06-09 DIAGNOSIS — E162 Hypoglycemia, unspecified: Secondary | ICD-10-CM | POA: Insufficient documentation

## 2022-06-09 DIAGNOSIS — R001 Bradycardia, unspecified: Secondary | ICD-10-CM | POA: Insufficient documentation

## 2022-06-09 DIAGNOSIS — I4891 Unspecified atrial fibrillation: Secondary | ICD-10-CM | POA: Insufficient documentation

## 2022-06-09 DIAGNOSIS — I429 Cardiomyopathy, unspecified: Secondary | ICD-10-CM | POA: Diagnosis present

## 2022-06-09 DIAGNOSIS — E878 Other disorders of electrolyte and fluid balance, not elsewhere classified: Secondary | ICD-10-CM | POA: Diagnosis present

## 2022-06-09 DIAGNOSIS — D696 Thrombocytopenia, unspecified: Secondary | ICD-10-CM | POA: Insufficient documentation

## 2022-06-09 DIAGNOSIS — I5022 Chronic systolic (congestive) heart failure: Secondary | ICD-10-CM | POA: Diagnosis present

## 2022-06-09 DIAGNOSIS — Z6836 Body mass index (BMI) 36.0-36.9, adult: Secondary | ICD-10-CM | POA: Diagnosis not present

## 2022-06-09 DIAGNOSIS — F410 Panic disorder [episodic paroxysmal anxiety] without agoraphobia: Secondary | ICD-10-CM | POA: Diagnosis present

## 2022-06-09 DIAGNOSIS — E669 Obesity, unspecified: Secondary | ICD-10-CM | POA: Diagnosis present

## 2022-06-09 DIAGNOSIS — R9431 Abnormal electrocardiogram [ECG] [EKG]: Secondary | ICD-10-CM | POA: Diagnosis present

## 2022-06-09 DIAGNOSIS — Z87891 Personal history of nicotine dependence: Secondary | ICD-10-CM | POA: Diagnosis not present

## 2022-06-09 DIAGNOSIS — I482 Chronic atrial fibrillation, unspecified: Secondary | ICD-10-CM

## 2022-06-09 DIAGNOSIS — Z79899 Other long term (current) drug therapy: Secondary | ICD-10-CM | POA: Diagnosis not present

## 2022-06-09 DIAGNOSIS — Z823 Family history of stroke: Secondary | ICD-10-CM | POA: Diagnosis not present

## 2022-06-09 DIAGNOSIS — I110 Hypertensive heart disease with heart failure: Secondary | ICD-10-CM | POA: Diagnosis present

## 2022-06-09 LAB — BASIC METABOLIC PANEL
Anion gap: 8 (ref 5–15)
Anion gap: 11 (ref 5–15)
BUN: 16 mg/dL (ref 8–23)
BUN: 15 mg/dL (ref 8–23)
CO2: 24 mmol/L (ref 22–32)
CO2: 25 mmol/L (ref 22–32)
Calcium: 8.9 mg/dL (ref 8.9–10.3)
Calcium: 9 mg/dL (ref 8.9–10.3)
Chloride: 106 mmol/L (ref 98–111)
Chloride: 105 mmol/L (ref 98–111)
Creatinine, Ser: 0.99 mg/dL (ref 0.61–1.24)
Creatinine, Ser: 1.02 mg/dL (ref 0.61–1.24)
GFR, Estimated: >60 mL/min (ref >60)
GFR, Estimated: >60 mL/min (ref >60)
Glucose, Bld: 127 mg/dL — ABNORMAL HIGH (ref 70–99)
Glucose, Bld: 92 mg/dL (ref 70–99)
Potassium: 3.7 mmol/L (ref 3.5–5.1)
Potassium: 4.1 mmol/L (ref 3.5–5.1)
Sodium: 138 mmol/L (ref 135–145)
Sodium: 141 mmol/L (ref 135–145)

## 2022-06-09 LAB — IRON AND TIBC
Iron: 59 ug/dL (ref 45–182)
Saturation Ratios: 20 % (ref 17.9–39.5)
TIBC: 297 ug/dL (ref 250–450)
UIBC: 238 ug/dL

## 2022-06-09 LAB — HIV ANTIBODY (ROUTINE TESTING W REFLEX): HIV Screen 4th Generation wRfx: NONREACTIVE

## 2022-06-09 LAB — I-STAT VENOUS BLOOD GAS, ED
Acid-Base Excess: 3 mmol/L — ABNORMAL HIGH (ref 0.0–2.0)
Bicarbonate: 28 mmol/L (ref 20.0–28.0)
Calcium, Ion: 1.13 mmol/L — ABNORMAL LOW (ref 1.15–1.40)
HCT: 42 % (ref 39.0–52.0)
Hemoglobin: 14.3 g/dL (ref 13.0–17.0)
O2 Saturation: 99 %
Potassium: 5.2 mmol/L — ABNORMAL HIGH (ref 3.5–5.1)
Sodium: 138 mmol/L (ref 135–145)
TCO2: 29 mmol/L (ref 22–32)
pCO2, Ven: 41.2 mmHg — ABNORMAL LOW (ref 44–60)
pH, Ven: 7.44 — ABNORMAL HIGH (ref 7.25–7.43)
pO2, Ven: 156 mmHg — ABNORMAL HIGH (ref 32–45)

## 2022-06-09 LAB — VITAMIN B12: Vitamin B-12: 470 pg/mL (ref 180–914)

## 2022-06-09 LAB — CBG MONITORING, ED
Glucose-Capillary: 135 mg/dL — ABNORMAL HIGH (ref 70–99)
Glucose-Capillary: 97 mg/dL (ref 70–99)
Glucose-Capillary: 76 mg/dL (ref 70–99)

## 2022-06-09 LAB — RETICULOCYTES
Immature Retic Fract: 14.8 % (ref 2.3–15.9)
RBC.: 5.08 MIL/uL (ref 4.22–5.81)
Retic Count, Absolute: 74.7 10*3/uL (ref 19.0–186.0)
Retic Ct Pct: 1.5 % (ref 0.4–3.1)

## 2022-06-09 LAB — FOLATE: Folate: 11.8 ng/mL (ref >5.9)

## 2022-06-09 LAB — TROPONIN I (HIGH SENSITIVITY): Troponin I (High Sensitivity): 11 ng/L (ref <18)

## 2022-06-09 LAB — FERRITIN: Ferritin: 231 ng/mL (ref 24–336)

## 2022-06-09 LAB — HEPARIN LEVEL (UNFRACTIONATED): Heparin Unfractionated: 0.41 IU/mL (ref 0.30–0.70)

## 2022-06-09 LAB — TSH: TSH: 1.019 u[IU]/mL (ref 0.350–4.500)

## 2022-06-09 LAB — MAGNESIUM: Magnesium: 2.3 mg/dL (ref 1.7–2.4)

## 2022-06-09 LAB — VITAMIN D 25 HYDROXY (VIT D DEFICIENCY, FRACTURES): Vit D, 25-Hydroxy: 16.39 ng/mL — ABNORMAL LOW (ref 30–100)

## 2022-06-09 MED ORDER — POTASSIUM CHLORIDE CRYS ER 20 MEQ PO TBCR
40.0000 meq | EXTENDED_RELEASE_TABLET | Freq: Once | ORAL | Status: AC
  Administered 2022-06-09: 40 meq via ORAL
  Filled 2022-06-09: qty 2

## 2022-06-09 MED ORDER — CALCIUM GLUCONATE-NACL 2-0.675 GM/100ML-% IV SOLN
2.0000 g | Freq: Once | INTRAVENOUS | Status: DC
  Filled 2022-06-09 (×2): qty 100

## 2022-06-09 MED ORDER — POTASSIUM CHLORIDE 10 MEQ/100ML IV SOLN
10.0000 meq | INTRAVENOUS | Status: DC
  Filled 2022-06-09: qty 100

## 2022-06-09 MED ORDER — ACETAMINOPHEN 650 MG RE SUPP: 650.0000 mg | Freq: Four times a day (QID) | RECTAL | Status: DC | PRN

## 2022-06-09 MED ORDER — MAGNESIUM SULFATE 2 GM/50ML IV SOLN
2.0000 g | Freq: Once | INTRAVENOUS | Status: DC
  Administered 2022-06-09: 2 g via INTRAVENOUS

## 2022-06-09 MED ORDER — MAGNESIUM OXIDE -MG SUPPLEMENT 400 (240 MG) MG PO TABS
800.0000 mg | ORAL_TABLET | Freq: Once | ORAL | Status: DC
Start: 2022-06-09 — End: 2022-06-09

## 2022-06-09 MED ORDER — HEPARIN BOLUS VIA INFUSION
5750.0000 [IU] | Freq: Once | INTRAVENOUS | Status: DC
  Filled 2022-06-09: qty 5750

## 2022-06-09 MED ORDER — ACETAMINOPHEN 325 MG PO TABS: 650.0000 mg | ORAL_TABLET | Freq: Four times a day (QID) | ORAL | Status: DC | PRN

## 2022-06-09 MED ORDER — MAGNESIUM SULFATE 2 GM/50ML IV SOLN
2.0000 g | Freq: Once | INTRAVENOUS | Status: DC
  Filled 2022-06-09: qty 50

## 2022-06-09 MED ORDER — HEPARIN (PORCINE) 25000 UT/250ML-% IV SOLN
1750.0000 [IU]/h | INTRAVENOUS | Status: DC
  Administered 2022-06-09 – 2022-06-10 (×2): 1800 [IU]/h via INTRAVENOUS
  Filled 2022-06-09 (×2): qty 250

## 2022-06-09 MED ORDER — POTASSIUM CHLORIDE 10 MEQ/100ML IV SOLN
10.0000 meq | INTRAVENOUS | Status: DC
  Administered 2022-06-09: 10 meq via INTRAVENOUS

## 2022-06-09 MED ORDER — CALCIUM CARBONATE ANTACID 500 MG PO CHEW: 1.0000 | CHEWABLE_TABLET | Freq: Once | ORAL | Status: DC

## 2022-06-09 MED ORDER — HYDROXYZINE HCL 25 MG PO TABS: 25.0000 mg | ORAL_TABLET | Freq: Three times a day (TID) | ORAL | Status: DC | PRN

## 2022-06-09 NOTE — Progress Notes (Signed)
ANTICOAGULATION CONSULT NOTE - Initial Consult  Pharmacy Consult for Heparin Indication: atrial fibrillation  No Known Allergies  Patient Measurements: Height: 6\' 4"  (193 cm) Weight: 131.5 kg (290 lb) IBW/kg (Calculated) : 86.8 Heparin Dosing Weight: 115 kg  Vital Signs: Temp: 98.1 F (36.7 C) (11/14 1228) Temp Source: Oral (11/14 1228) BP: 111/81 (11/14 1300) Pulse Rate: 91 (11/14 1300)  Labs: Recent Labs    06/08/22 2145 06/09/22 0124 06/09/22 0140 06/09/22 0413  HGB 9.3*  --  14.3  --   HCT 28.4*  --  42.0  --   PLT 132*  --   --   --   CREATININE 0.56* 0.99  --  1.02  TROPONINIHS 6 11  --   --     Estimated Creatinine Clearance: 109.8 mL/min (by C-G formula based on SCr of 1.02 mg/dL).   Medical History: Past Medical History:  Diagnosis Date   Atrial fibrillation (HCC)    Hypertension     Medications:  (Not in a hospital admission)  Scheduled:   heparin  5,750 Units Intravenous Once   Infusions:   heparin     PRN: acetaminophen **OR** acetaminophen, hydrOXYzine  Assessment: 61 yom with a history of AF not on AC, EF, HTN. Patient is presenting with SOB. Heparin per pharmacy consult placed for atrial fibrillation.  Patient is not on anticoagulation prior to arrival. Historically, patient on coumadin but this was stopped following bleeding events.  Hgb 14.3; plt 132  Goal of Therapy:  Heparin level 0.3-0.7 units/ml Monitor platelets by anticoagulation protocol: Yes   Plan:  No initial heparin bolus Start heparin infusion at 1800 units/hr Check anti-Xa level in 6 hours and daily while on heparin Continue to monitor H&H and platelets  06/11/22, PharmD, BCPS 06/09/2022 3:28 PM ED Clinical Pharmacist -  [phone]

## 2022-06-09 NOTE — ED Notes (Signed)
Pt removed IV.

## 2022-06-09 NOTE — ED Notes (Signed)
Patient had called out to the nurses desk while this paramedic was in with another patient. This patient began yelling "hello" very loudly. This paramedic came to room to see if patient was in distress. Patient stated "I can't sleep with this light damn light on". Patients room light was cut off and this paramedic left the room.

## 2022-06-09 NOTE — H&P (Signed)
History and Physical    Riley Jefferson [id number] DOB: 1960/11/24 DOA: 06/08/2022  PCP: System, Provider Not In  Patient coming from: Home  Chief Complaint: Shortness of breath  HPI: Riley Jefferson is a 61 y.o. male with medical history significant of A-fib, hypertension presented to the ED complaining of shortness of breath and chest tightness.  At triage, patient noted to have 2 episodes of symptomatic bradycardia with shortness of breath and lightheadedness with heart rate decreasing to the 30s.  Noted to be in A-fib and heart rate subsequently improved to the 60s to 70s.  Not hypoxic.  Labs showing hemoglobin 9.3, MCV 87.1, platelet count 132k, potassium 2.1, chloride 126, bicarb 13, anion gap 4, glucose 62, BUN 11, creatinine 0.5, calcium 4.9, albumin 1.8, total protein 3.6, initial troponin negative with repeat pending, BNP normal, magnesium level pending, VBG showing pH 7.44.  Chest x-ray showing no active disease. After oral carbohydrate intake, CBG improved to 135. Medications administered in the ED include oral potassium 40 mg x 2 and IV potassium 10 mg x 4, IV calcium gluconate 2 g, and IV magnesium 2 g.  TRH called to admit.  Patient is a poor historian.  States he came to the emergency room tonight as he suddenly became short of breath while sitting.  He denies experiencing any chest pain or chest tightness.  Denies lightheadedness/dizziness, fevers, or cough.  He reports good appetite and denies any vomiting or diarrhea.  Reports history of A-fib for which he takes metoprolol but does not remember the dose.  No other complaints.  Review of Systems:  Review of Systems  All other systems reviewed and are negative.   Past Medical History:  Diagnosis Date   Atrial fibrillation ([place])    Hypertension     No past surgical history on file.   reports that he quit smoking about 20 years ago. His smoking use included cigarettes. He has a 20.00 pack-year smoking history. He has never  used smokeless tobacco. He reports that he does not drink alcohol and does not use drugs.  No Known Allergies  History reviewed. No pertinent family history.  Prior to Admission medications   Medication Sig Start Date End Date Taking? Authorizing Provider  diclofenac Sodium (VOLTAREN) 1 % GEL Apply 2 g topically 4 (four) times daily. 10/21/21   Tegeler, Gwenyth Allegra, MD  lisinopril (ZESTRIL) 10 MG tablet Take 1 tablet (10 mg total) by mouth daily. 03/24/22   Veryl Speak, MD  metoprolol succinate (TOPROL XL) 50 MG 24 hr tablet Take 1 tablet (50 mg total) by mouth daily. Take with or immediately following a meal. 03/24/22   Veryl Speak, MD    Physical Exam: Vitals:   06/08/22 2145 06/08/22 2245 06/09/22 0015 06/09/22 0115  BP: (!) 143/86 (!) 157/94 (!) 139/97 133/83  Pulse: 72 66 63 63  Resp: 14 18 17 15   Temp:      TempSrc:      SpO2: 97% 100% 98% 98%    Physical Exam Vitals reviewed.  Constitutional:      General: He is not in acute distress. HENT:     Head: Normocephalic and atraumatic.  Cardiovascular:     Rate and Rhythm: Normal rate and regular rhythm.     Pulses: Normal pulses.  Pulmonary:     Effort: Pulmonary effort is normal. No respiratory distress.     Breath sounds: Normal breath sounds. No wheezing or rales.  Abdominal:     General: Bowel sounds are  normal. There is no distension.     Palpations: Abdomen is soft.     Tenderness: There is no abdominal tenderness.  Musculoskeletal:        General: No swelling or tenderness.     Cervical back: Normal range of motion.  Skin:    General: Skin is warm and dry.  Neurological:     General: No focal deficit present.     Mental Status: He is alert and oriented to person, place, and time.     Labs on Admission: I have personally reviewed following labs and imaging studies  CBC: Recent Labs  Lab 06/08/22 2145 06/09/22 0140  WBC 4.3  --   NEUTROABS 1.9  --   HGB 9.3* 14.3  HCT 28.4* 42.0  MCV 87.1  --    PLT 132*  --    Basic Metabolic Panel: Recent Labs  Lab 06/08/22 2145 06/09/22 0140  NA 143 138  K 2.1* 5.2*  CL 126*  --   CO2 13*  --   GLUCOSE 62*  --   BUN 11  --   CREATININE 0.56*  --   CALCIUM 4.9*  --    GFR: CrCl cannot be calculated (Unknown ideal weight.). Liver Function Tests: Recent Labs  Lab 06/08/22 2145  AST 12*  ALT 11  ALKPHOS 40  BILITOT 0.3  PROT 3.6*  ALBUMIN 1.8*   No results for input(s): "LIPASE", "AMYLASE" in the last 168 hours. No results for input(s): "AMMONIA" in the last 168 hours. Coagulation Profile: No results for input(s): "INR", "PROTIME" in the last 168 hours. Cardiac Enzymes: No results for input(s): "CKTOTAL", "CKMB", "CKMBINDEX", "TROPONINI" in the last 168 hours. BNP (last 3 results) No results for input(s): "PROBNP" in the last 8760 hours. HbA1C: No results for input(s): "HGBA1C" in the last 72 hours. CBG: Recent Labs  Lab 06/09/22 0122  GLUCAP 135*   Lipid Profile: No results for input(s): "CHOL", "HDL", "LDLCALC", "TRIG", "CHOLHDL", "LDLDIRECT" in the last 72 hours. Thyroid Function Tests: No results for input(s): "TSH", "T4TOTAL", "FREET4", "T3FREE", "THYROIDAB" in the last 72 hours. Anemia Panel: No results for input(s): "VITAMINB12", "FOLATE", "FERRITIN", "TIBC", "IRON", "RETICCTPCT" in the last 72 hours. Urine analysis: No results found for: "COLORURINE", "APPEARANCEUR", "LABSPEC", "PHURINE", "GLUCOSEU", "HGBUR", "BILIRUBINUR", "KETONESUR", "PROTEINUR", "UROBILINOGEN", "NITRITE", "LEUKOCYTESUR"  Radiological Exams on Admission: DG Chest Port 1 View  Result Date: 06/08/2022 CLINICAL DATA:  Dyspnea EXAM: PORTABLE CHEST 1 VIEW COMPARISON:  None Available. FINDINGS: The heart size and mediastinal contours are within normal limits. Both lungs are clear. The visualized skeletal structures are unremarkable. IMPRESSION: No active disease. Electronically Signed   By: Helyn Numbers M.D.   On: 06/08/2022 21:06    EKG:  Independently reviewed.  A-fib, QTc 435.  T wave inversions in lateral leads new since prior tracing from 03/23/2022.  Assessment and Plan  Severe hypokalemia Potassium 2.1 on initial labs.  Etiology unclear.  No vomiting or diarrhea.  Not on diuretics.  After potassium replacement, repeat labs showing potassium 3.7.  Magnesium level is normal. -Cardiac monitoring -Continue to monitor potassium and replace if low  ?Hyperchloremic (normal anion gap) metabolic acidosis Initial labs showing chloride 126, bicarb 13 with no elevation of anion gap.  Etiology unclear. ?Proximal (type 2) RTA given presence of unexplained severe hypokalemia, however, repeat labs showing chloride 106 and bicarb 24.  VBG showing normal pH. -Monitor BMP and consider further work-up/nephrology consultation if metabolic acidosis is persistent.  Hypocalcemia Calcium 6.7 when corrected for hypoalbuminemia.  Magnesium level is normal.  Not on any medications which would cause this.  Etiology unclear. -IV calcium gluconate 2 g given -Check ionized calcium level, PTH, vitamin D level  Hypoglycemia Initial labs with glucose 62, improved to 135 after oral carbohydrate intake.  No history of diabetes. -Encourage p.o. intake -CBG checks ACHS  Atrial fibrillation with slow ventricular response Symptomatic bradycardia Noted to be in A-fib and symptomatic in triage with heart rate dropping to the 30s.  Rate currently improved to the 60s to 70s and asymptomatic.  Blood pressure stable. Bradycardia likely precipitated by electrolyte derangements.  Takes metoprolol at home. -Cardiac monitoring -Monitor electrolytes and continue to replace if low -Hold metoprolol at this time and avoid any other AV nodal blocking agents. -Check TSH  Acute EKG changes EKG showing new T wave inversions in lateral leads which could be due to electrolyte derangements.  Patient denies any chest pain.  Troponin negative x2 and not consistent with  ACS. -Cardiac monitoring -Repeat EKG  Normocytic anemia Hemoglobin was 15.7 in April 2022 and now down to 9.3.  Patient is not endorsing any symptoms of GI bleeding. -Anemia panel -FOBT  Mild thrombocytopenia No signs of active bleeding. -Continue to monitor  Hypertension -Pharmacy med rec pending -Hold metoprolol at this time given episodes of bradycardia  DVT prophylaxis: SCDs Code Status: Full Code (discussed with the patient) Family Communication: No family at bedside. Level of care: Telemetry bed Admission status: It is my clinical opinion that referral for OBSERVATION is reasonable and necessary in this patient based on the above information provided. The aforementioned taken together are felt to place the patient at high risk for further clinical deterioration. However, it is anticipated that the patient may be medically stable for discharge from the hospital within 24 to 48 hours.   Shela Leff MD [facility]  If 7PM-7AM, please contact night-coverage [email or web address]  06/09/2022, 2:05 AM

## 2022-06-09 NOTE — Consult Note (Addendum)
Cardiology Consultation   Patient ID: Riley Jefferson MRN: [id number]; DOB: Dec 13, 1960  Admit date: 06/08/2022 Date of Consult: 06/09/2022  PCP:  System, Provider Not In   [place] Providers Cardiologist:  None   {    Patient Profile:   Riley Jefferson is a 61 y.o. male with a history of chronic atrial fibrillation not on anticoagulation, HFrEF with EF of 40-45% in 04/2019,  hypertension, and prior tobacco use who is being seen 06/09/2022 for the evaluation of pain and shortness of breath at the request of Dr. Roderic Palau.    History of Present Illness:   Riley Jefferson is a 61 year old male with the above history.  He is originally from [place], and he lived in [place] for a while and was followed by a Cardiologist there.  He has a history of chronic atrial fibrillation dating back to 2004.  He was previously on Coumadin but was unable to tolerate this due to significant bleeding (hemoptysis, hematuria, epistaxis, and bleeding from eyes). He was unable to afford DOACs and therefore has not been on any anticoagulation. Per review of notes in [place], he has a long history of dyspnea on exertion. Prior Myoview in 09/2015 showed no evidence of ischemia. EF 31%. Echo at that time showed LVEF of 40-45% and severe left atrial enlargement. He was admitted at a hospital in [place], [place] in 04/2022 for sudden onset of shortness of breath and then developed chest pain in the ED.  PET stress test in 04/2019 showed no evidence of ischemia or infarction. Echo at that time showed LVEF of 40-45% with reduced global longitudinal strain of -7% and mild LVH. He moved to [facility] in 06/2021 and has not established with a PCP or Cardiologist here yet.  Patient presented to the ED on 06/08/2022 for sudden onset of shortness of breath. Patient was in his usual stated of health until yesterday when he got very upset and frustrated after someone stole his belonging at work. He states he then  had a panic attack with associated shortness of breath and chest pressure which he why he presented to the ED. He states this will happen occasionally. However, he also reports intermittent shortness of breath and chest pressure outside of panic attacks. He reports occasional sudden episodes of shortness of breath and diaphoresis especially if he is outside working in the heat. Symptoms resolve when he goes inside. However, he will also have sudden onset of shortness of breath and chest pressure while at rest but states symptoms will not last for long. The chest pain typically last less than a minute and the shortness of breath can last a couple of minutes but then resolves on its own. This sounds very atypical and it sounds like this has been going on for years. However, he also describes significant dyspnea on exertion with minimal activity such as walking around his heart or doing the laundry. He denies any orthopnea. He does report that he will occasional wake up gasping for air. He also reports snoring and states he has been told he stops breathing at night which sounds very consistent with sleep apnea. He occasionally will feel like his heart is racing with his atrial fibrillation but normally asymptomatic with his. He also describes some occasional lightheadedness/dizziness especially if he is outside in the heat. However, he denies any syncope. He had some diarrhea for about 2 weeks recently but none over the last week. He denies any other recent fevers or illnesses. Last nose bleed  occurred a couple of months ago but only lasted about 30 seconds. No other abnormal bleeding.  Upon arrival to the ED, patient hypertensive but vitals stable. EKG shows atrial fibrillation, rate 68 bpm, with LVH and mild ST depression and T wave inversions in leads V5-V6. High-sensitivity troponin negative x2. BNP normal. Chest x-ray showed no acute findings. WBC 4.3, Hgb 9.3, Plts 132. Na 143, K 2.1, Glucose 62, BUN 11, Cr  -0.56, Ca 4.9, Total Protein 3.6, Albumin 1.8, AST 12, ALT 11, Alk Phos 40, Total Bili 0.3. Magnesium 2.3. TSH normal. Patient was admitted for further evaluation. Cardiology consulted for further  At the time of this evaluation, patient is resting comfortably in no acute distress. He is currently in atrial fibrillation with rates in the 70s. There is mention of his heart rate dropping to the 30s in the H&P but I don't see this on telemetry. Telemetry does show some occasional pauses (longest one 2.22 second) with heart rates in the 40s around those times but this very brief. He does report some chest pressure at this time but looks comfortable.   Past Medical History:  Diagnosis Date   Atrial fibrillation ([place])    Hypertension     No past surgical history on file.   Home Medications:  Prior to Admission medications   Medication Sig Start Date End Date Taking? Authorizing Provider  metoprolol succinate (TOPROL XL) 50 MG 24 hr tablet Take 1 tablet (50 mg total) by mouth daily. Take with or immediately following a meal. 03/24/22  Yes Delo, Nathaneil Canary, MD    Inpatient Medications: Scheduled Meds:  Continuous Infusions:  PRN Meds: acetaminophen **OR** acetaminophen, hydrOXYzine  Allergies:   No Known Allergies  Social History:   Social History   Socioeconomic History   Marital status: Single    Spouse name: Not on file   Number of children: Not on file   Years of education: Not on file   Highest education level: Not on file  Occupational History   Not on file  Tobacco Use   Smoking status: Former    Packs/day: 1.00    Years: 20.00    Total pack years: 20.00    Types: Cigarettes    Quit date: 09/21/2001    Years since quitting: 20.7   Smokeless tobacco: Never  Vaping Use   Vaping Use: Never used  Substance and Sexual Activity   Alcohol use: Never   Drug use: Never   Sexual activity: Not on file  Other Topics Concern   Not on file  Social History Narrative   Not on file    Social Determinants of Health   Financial Resource Strain: Not on file  Food Insecurity: Not on file  Transportation Needs: Not on file  Physical Activity: Not on file  Stress: Not on file  Social Connections: Not on file  Intimate Partner Violence: Not on file    Family History:   Family History  Problem Relation Age of Onset   Stroke Father      ROS:  Please see the history of present illness.  Review of Systems  Constitutional:  Negative for fever.  HENT:  Positive for nosebleeds (occasoinally - last episode in 02/2022).   Respiratory:  Positive for shortness of breath. Negative for hemoptysis.   Cardiovascular:  Positive for chest pain and palpitations. Negative for orthopnea.  Gastrointestinal:  Positive for diarrhea. Negative for abdominal pain, blood in stool, melena, nausea and vomiting.  Genitourinary:  Negative for hematuria.  Musculoskeletal:  Negative for falls.  Neurological:  Positive for dizziness. Negative for loss of consciousness.  Endo/Heme/Allergies:  Does not bruise/bleed easily.  Psychiatric/Behavioral:  Substance abuse: remote smoking history - quit in 2004.     Physical Exam/Data:   Vitals:   06/09/22 1150 06/09/22 1200 06/09/22 1228 06/09/22 1300  BP: (!) 139/93 128/80  111/81  Pulse: 79 67  91  Resp: 14 16  (!) 21  Temp:   98.1 F (36.7 C)   TempSrc:   Oral   SpO2: 98% 97%  92%   No intake or output data in the 24 hours ending 06/09/22 1446    03/23/2022    8:27 PM 10/21/2021    7:49 PM  Last 3 Weights  Weight (lbs) 290 lb 285 lb  Weight (kg) 131.543 kg 129.275 kg     There is no height or weight on file to calculate BMI.  General: 61 y.o. obese African-American male resting comfortably in no acute distress. HEENT: Normocephalic and atraumatic. Sclera clear.  Neck: Supple. No carotid bruits. No JVD. Heart: Irregularly irregular rhythm with normal rate. Distinct S1 and S2. No murmurs, gallops, or rubs. Radial pulses 2+ and equal  bilaterally. Lungs: No increased work of breathing. Clear to ausculation bilaterally. No wheezes, rhonchi, or rales.  Abdomen: Soft, non-distended, and non-tender to palpation.  Extremities: No lower extremity edema.    Skin: Warm and dry. Neuro: Alert and oriented x3. No focal deficits. Psych: Normal affect. Responds appropriately.  EKG:  The EKG was personally reviewed and demonstrates:  Atrial fibrillation, rate 68 bpm, with LVH and mild ST depression and T wave inversions in leads V5-V6.  Telemetry:  Telemetry was personally reviewed and demonstrates:  Atrial fibrillation with rates mostly in the 60s to 80s (occasional brief episodes in the 40s). Occasional pauses (longest one 2.2 seconds). PVCs.  Relevant CV Studies:  PET Stress Test 05/08/2019 [facility]): Impressions: 1. No evidence of ischemia or infarction  2. Normal left wall motion  3. Abnormal decreased ejection fraction  4. Normal coronary flow reserve  5. TID is normal  6. No significant coronary artery calcification noted on CT obtained for attenuation correction  7. This is a low risk scan  8. Small pulmonary nodule seen at the left lower lobe  _______________  Echocardiogram 05/08/2019 [facility]): Impressions:  1. Overall left ventricular ejection fraction is estimated at 40 to 45%.   2. Mildly to moderately decreased global left ventricular systolic function.   3. Mild septal left ventricular hypertrophy.   4. Mild to moderately increased left ventricular internal cavity size.   5. Mildly dilated left atrium.   6. Mild mitral annular calcification.   7. GLS -7% (reduced).    Laboratory Data:  High Sensitivity Troponin:   Recent Labs  Lab 06/08/22 2145 06/09/22 0124  TROPONINIHS 6 11     Chemistry Recent Labs  Lab 06/08/22 2145 06/09/22 0124 06/09/22 0140 06/09/22 0413  NA 143 138 138 141  K 2.1* 3.7 5.2* 4.1  CL 126* 106  --  105  CO2 13* 24  --  25  GLUCOSE 62* 127*  --  92   BUN 11 16  --  15  CREATININE 0.56* 0.99  --  1.02  CALCIUM 4.9* 8.9  --  9.0  MG  --  2.3  --   --   GFRNONAA >60 >60  --  >60  ANIONGAP 4* 8  --  11    Recent Labs  Lab 06/08/22 2145  PROT 3.6*  ALBUMIN 1.8*  AST 12*  ALT 11  ALKPHOS 40  BILITOT 0.3   Lipids No results for input(s): "CHOL", "TRIG", "HDL", "LABVLDL", "LDLCALC", "CHOLHDL" in the last 168 hours.  Hematology Recent Labs  Lab 06/08/22 2145 06/09/22 0140 06/09/22 0413  WBC 4.3  --   --   RBC 3.26*  --  5.08  HGB 9.3* 14.3  --   HCT 28.4* 42.0  --   MCV 87.1  --   --   MCH 28.5  --   --   MCHC 32.7  --   --   RDW 16.0*  --   --   PLT 132*  --   --    Thyroid  Recent Labs  Lab 06/09/22 0413  TSH 1.019    BNP Recent Labs  Lab 06/08/22 2145  BNP 45.8    DDimer No results for input(s): "DDIMER" in the last 168 hours.   Radiology/Studies:  DG Chest Port 1 View  Result Date: 06/08/2022 CLINICAL DATA:  Dyspnea EXAM: PORTABLE CHEST 1 VIEW COMPARISON:  None Available. FINDINGS: The heart size and mediastinal contours are within normal limits. Both lungs are clear. The visualized skeletal structures are unremarkable. IMPRESSION: No active disease. Electronically Signed   By: Fidela Salisbury M.D.   On: 06/08/2022 21:06     Assessment and Plan:   Shortness of Breath and Chest Pain Chronic CHF with Mildly Reduced EF Patient has a long history of dyspnea on exertion. Last Echo in 04/2019 in LVEF of 40-45% with reduced global longitudinal strain of -7% and mild LVH. PET stress test in 04/2019 showed no evidence of ischemia or prior infarction. Patient presented with sudden onset of shortness of breath and substernal chest pressure.  Current symptoms seem to occur in the setting of emotional stress chest pain and panic attack; however, he also reports ongoing dyspnea on exertion with minimal activity as well as intermittent sudden onset of shortness of breath and substernal chest pressure at rest. EKG shows  rate controlled atrial fibrillation with LVH and mild ST depression and T wave inversions in leads V5-V6. High-sensitivity troponin negative x2. BNP normal. Chest x-ray showed no acute findings. - Patient reports some mild chest pressure at this time but appears comfortably. He does not look volume overloaded on exam.  - Repeat Echo pending.  - Chest pain sounds very atypical. His dyspnea seems to be a longstanding issue. He has never had definitive evaluation of his coronaries from what I can tell. We will wait for Echo results. However, if EF is still down may need to consider R/LHC.  Chronic Atrial Fibrillation Dating back to 2004. Previously on Coumadin but had significant bleeding with this including hemoptysis, hematuria, epistaxis, and bleeding from eyes. He was unable to afford a DOAC so has not been on anticoagulation. - Currently rate controlled. Telemetry shows rates mostly in the 60s to 80s. He has occasional pauses (longest one 2.2 seconds) with rates briefly in the 40s. (There was mention in H&P of rates intermittently in the 30s but I do not see this on telemetry). - Potassium was 2.1 on admission. Repleted and now 4.1. - Magnesium 2.3. - Home Toprol-XL has been held. OK to continue to hold for now. - CHA2DS-VASc = 2 (HTN and HFrEF). Will start IV Heparin for now to make sure he tolerates this well from a bleeding standpoint. Will ask Case Management to run the cost of Eliquis and Xarelto.  Hypertension BP was elevated on arrival but has been well controlled this afternoon. - Continue to monitor.  Hypokalemia Potassium 2.1 on admission. Repleted and normalized. - Potassium 4.1 this morning.  Suspect Sleep Apnea Patient reports snoring and states he has been told he stops breathing at night. - Would recommend outpatient sleep study.  Otherwise, per primary team: - Hypocalcemia - Hypoglycemia - Normocytic anemia - Thrombocytopenia    Risk Assessment/Risk Scores:     New York Heart Association (NYHA) Functional Class NYHA Class III   CHA2DS2-VASc Score = 2  This indicates a 2.2% annual risk of stroke. The patient's score is based upon: CHF History: 1 HTN History: 1 Diabetes History: 0 Stroke History: 0 Vascular Disease History: 0 Age Score: 0 Gender Score: 0     For questions or updates, please contact [place] Please consult [email or web address] for contact info under    Signed, Darreld Mclean, PA-C  06/09/2022 2:46 PM   ATTENDING ATTESTATION:  After conducting a review of all available clinical information with the care team, interviewing the patient, and performing a physical exam, I agree with the findings and plan described in this note.   GEN: No acute distress.   HEENT:  MMM, no JVD but difficult to assess, no scleral icterus Cardiac: Irregular RR, no murmurs, rubs, or gallops.  Respiratory: Clear to auscultation bilaterally. GI: Soft, obese, nontender, non-distended  MS: No edema; No deformity. 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  He tells me he got quite agitated and began having palpitations.  He denies any chest pain.  In terms of other symptoms he tells me he gets short of breath with activity but this does not happen on a regular basis.  He does not really endorse any shortness of breath at rest but sometimes does develop orthopnea.  He denies any peripheral edema.  He has had no paroxysmal nocturnal dyspnea.  He does snore and has never been tested for sleep apnea.  His  evaluation here in the emergency department has been relatively reassuring other than hypokalemia which has been treated.  His EKG demonstrates atrial fibrillation with no ischemic changes.  His troponin is negative x2.    An echocardiogram.  Based on this we may consider a right heart catheterization and coronary angiography study to characterize his cardiomyopathy which I believe will be nonischemic based on his PET stress test results.  We will also tailor his medical therapy based on his ejection fraction.  I believe he likely needs a sleep apnea evaluation as well.  We will monitor him on telemetry and determine whether we can get cost assistance in regards to a DOAC for this patient.   Lenna Sciara, MD Pager [phone]

## 2022-06-09 NOTE — ED Notes (Signed)
Orders for calcium, magnesium, and potassium discontinued per MD Palumbo due to repeat lab work.

## 2022-06-09 NOTE — Progress Notes (Signed)
Patient admitted to the hospital earlier this morning by Dr. Loney Loh  Patient seen and examined.  He does report having significant dyspnea on exertion which resolves with rest.  This has been intermittent for quite some time now.  Reports having heaviness in his chest when dyspnea occurs.  He does have occasional diaphoresis.  Denies any nausea with this.  He does complain of some occasional left arm pain.  His main reason to come to the emergency room last night was dyspnea on exertion.  He often does feel his heart racing during these episodes.  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  He does report that he does have these symptoms when he is at work.  Labs on arrival to the emergency room were reviewed which show widespread electrolyte abnormalities including hypokalemia, metabolic acidosis, hypoglycemia, hypocalcemia.  Labs were repeated a few hours later and noted to be normalized.  I suspect that initial labs may have been erroneous.  EKG done on admission did show T wave inversion in lateral leads which appear to be new since 02/2022.  Cardiac enzymes negative x2.  BNP normal.  There is mention on H&P that he was noted to be bradycardic in triage with heart rate dropping to the 30s.  I have tried to review the ER record and cannot find where this is documented in flowsheets.  His beta-blocker was held on admission.  TSH checked and noted to be normal.  Patient recently moved to the area from around to [place].  He was followed at [facility] clinic.  Review of records in care everywhere show that he had an echo done in 04/2019 that showed ejection fraction of 40 to 45%.  He also had a PET stress test in 04/2019 that was noted to be a low risk study.  He has a long history of atrial fibrillation since 2007.  He has been taking metoprolol.  He was previously on warfarin, but reports he was taken off in 2008 when he had significant bleeding from urine, nose,  mouth.  Review of records from 2017 cardiology note at [facility] note that he was prescribed apixaban, but may not have been able to obtain it due to insurance coverage.  His CHA2DS2-VASc score is 2 for hypertension and CHF.  Due to patient's symptoms including dyspnea on exertion, chest discomfort, EKG changes, will request echocardiogram and cardiology consultation.  Heart rate is currently in the 70s.  Can consider restarting metoprolol for observation pending cardiology evaluation.  He is also chronically on lisinopril for blood pressure.  Will await cardiology evaluation since starting ARB/Entresto may be more appropriate.  He seems to have a significant anxiety component as well to his symptoms.  We will use Vistaril as needed.  He has requested social work consult as well to help with his insurance issues.  Reports having Medicaid in [place], not sure if this was able to transfer over to [facility].  Also assistance in helping to get medication if Medicaid cannot transfer over.  [employer]

## 2022-06-10 ENCOUNTER — Inpatient Hospital Stay (HOSPITAL_COMMUNITY): Payer: Medicaid Other

## 2022-06-10 ENCOUNTER — Other Ambulatory Visit (HOSPITAL_COMMUNITY): Payer: Self-pay

## 2022-06-10 DIAGNOSIS — R001 Bradycardia, unspecified: Secondary | ICD-10-CM

## 2022-06-10 DIAGNOSIS — D696 Thrombocytopenia, unspecified: Secondary | ICD-10-CM

## 2022-06-10 DIAGNOSIS — I4891 Unspecified atrial fibrillation: Secondary | ICD-10-CM | POA: Diagnosis not present

## 2022-06-10 DIAGNOSIS — R079 Chest pain, unspecified: Secondary | ICD-10-CM

## 2022-06-10 DIAGNOSIS — E872 Acidosis, unspecified: Secondary | ICD-10-CM

## 2022-06-10 DIAGNOSIS — I10 Essential (primary) hypertension: Secondary | ICD-10-CM

## 2022-06-10 DIAGNOSIS — E876 Hypokalemia: Secondary | ICD-10-CM | POA: Diagnosis not present

## 2022-06-10 LAB — CBC
HCT: 45.8 % (ref 39.0–52.0)
Hemoglobin: 15.8 g/dL (ref 13.0–17.0)
MCH: 27.8 pg (ref 26.0–34.0)
MCHC: 34.5 g/dL (ref 30.0–36.0)
MCV: 80.5 fL (ref 80.0–100.0)
Platelets: 180 10*3/uL (ref 150–400)
RBC: 5.69 MIL/uL (ref 4.22–5.81)
RDW: 15.3 % (ref 11.5–15.5)
WBC: 6.8 10*3/uL (ref 4.0–10.5)
nRBC: 0 % (ref 0.0–0.2)

## 2022-06-10 LAB — COMPREHENSIVE METABOLIC PANEL
ALT: 23 U/L (ref 0–44)
AST: 23 U/L (ref 15–41)
Albumin: 3.4 g/dL — ABNORMAL LOW (ref 3.5–5.0)
Alkaline Phosphatase: 72 U/L (ref 38–126)
Anion gap: 7 (ref 5–15)
BUN: 10 mg/dL (ref 8–23)
CO2: 25 mmol/L (ref 22–32)
Calcium: 9.1 mg/dL (ref 8.9–10.3)
Chloride: 106 mmol/L (ref 98–111)
Creatinine, Ser: 0.9 mg/dL (ref 0.61–1.24)
GFR, Estimated: >60 mL/min (ref >60)
Glucose, Bld: 95 mg/dL (ref 70–99)
Potassium: 3.9 mmol/L (ref 3.5–5.1)
Sodium: 138 mmol/L (ref 135–145)
Total Bilirubin: 0.9 mg/dL (ref 0.3–1.2)
Total Protein: 7.4 g/dL (ref 6.5–8.1)

## 2022-06-10 LAB — ECHOCARDIOGRAM COMPLETE
Area-P 1/2: 4.52 cm2
Calc EF: 41.1 %
Height: 76 in
S' Lateral: 3.3 cm
Single Plane A2C EF: 39.9 %
Single Plane A4C EF: 42.3 %
Weight: 4856 oz

## 2022-06-10 LAB — GLUCOSE, CAPILLARY
Glucose-Capillary: 111 mg/dL — ABNORMAL HIGH (ref 70–99)
Glucose-Capillary: 91 mg/dL (ref 70–99)
Glucose-Capillary: 91 mg/dL (ref 70–99)

## 2022-06-10 LAB — HEPARIN LEVEL (UNFRACTIONATED): Heparin Unfractionated: 0.69 IU/mL (ref 0.30–0.70)

## 2022-06-10 LAB — CALCIUM, IONIZED: Calcium, Ionized, Serum: 5.1 mg/dL (ref 4.5–5.6)

## 2022-06-10 LAB — MAGNESIUM: Magnesium: 2.1 mg/dL (ref 1.7–2.4)

## 2022-06-10 LAB — PHOSPHORUS: Phosphorus: 3 mg/dL (ref 2.5–4.6)

## 2022-06-10 MED ORDER — ACETAMINOPHEN 325 MG PO TABS: 650.0000 mg | ORAL_TABLET | Freq: Four times a day (QID) | ORAL | 0 refills | Status: DC | PRN

## 2022-06-10 MED ORDER — FUROSEMIDE 20 MG PO TABS: 20.0000 mg | ORAL_TABLET | Freq: Two times a day (BID) | ORAL | 0 refills | Status: DC

## 2022-06-10 MED ORDER — METOPROLOL SUCCINATE ER 25 MG PO TB24
12.5000 mg | ORAL_TABLET | Freq: Every day | ORAL | Status: DC
  Administered 2022-06-10: 12.5 mg via ORAL
  Filled 2022-06-10: qty 1

## 2022-06-10 MED ORDER — METOPROLOL SUCCINATE ER 25 MG PO TB24: 12.5000 mg | ORAL_TABLET | Freq: Every day | ORAL | 0 refills | Status: DC

## 2022-06-10 MED ORDER — PERFLUTREN LIPID MICROSPHERE
1.0000 mL | INTRAVENOUS | Status: AC | PRN
  Administered 2022-06-10: 2 mL via INTRAVENOUS

## 2022-06-10 MED ORDER — APIXABAN 5 MG PO TABS
5.0000 mg | ORAL_TABLET | Freq: Two times a day (BID) | ORAL | 0 refills | Status: DC
  Filled 2022-06-10: qty 60, 30d supply, fill #0

## 2022-06-10 MED ORDER — FUROSEMIDE 20 MG PO TABS: 20.0000 mg | ORAL_TABLET | Freq: Two times a day (BID) | ORAL | Status: DC

## 2022-06-10 NOTE — Progress Notes (Signed)
ANTICOAGULATION CONSULT NOTE   Pharmacy Consult for Heparin Indication: atrial fibrillation  No Known Allergies  Patient Measurements: Height: 6\' 4"  (193 cm) Weight: (!) 137.7 kg (303 lb 8 oz) IBW/kg (Calculated) : 86.8 Heparin Dosing Weight: 115 kg  Vital Signs: Temp: 97.9 F (36.6 C) (11/15 1040) Temp Source: Oral (11/15 1040) BP: 153/105 (11/15 1040) Pulse Rate: 77 (11/15 1040)  Labs: Recent Labs    06/08/22 2145 06/09/22 0124 06/09/22 0140 06/09/22 0413 06/09/22 2234 06/10/22 0851  HGB 9.3*  --  14.3  --   --   --   HCT 28.4*  --  42.0  --   --   --   PLT 132*  --   --   --   --   --   HEPARINUNFRC  --   --   --   --  0.41 0.69  CREATININE 0.56* 0.99  --  1.02  --  0.90  TROPONINIHS 6 11  --   --   --   --      Estimated Creatinine Clearance: 127.4 mL/min (by C-G formula based on SCr of 0.9 mg/dL).   Medical History: Past Medical History:  Diagnosis Date   Atrial fibrillation ([facility])    Hypertension     Medications:  Medications Prior to Admission  Medication Sig Dispense Refill Last Dose   metoprolol succinate (TOPROL XL) 50 MG 24 hr tablet Take 1 tablet (50 mg total) by mouth daily. Take with or immediately following a meal. 60 tablet 0 Past Week at unk    Scheduled:    Infusions:   heparin 1,800 Units/hr (06/10/22 0712)   PRN: acetaminophen **OR** acetaminophen, hydrOXYzine  Assessment: 61 yom with a history of AF not on AC, EF, HTN. Patient is presenting with SOB. Heparin per pharmacy consult placed for atrial fibrillation. Patient is not on anticoagulation prior to arrival. Historically, patient on coumadin but this was stopped following bleeding events.  Heparin level is therapeutic at 0.69. Will check copays of DOACs.  Goal of Therapy:  Heparin level 0.3-0.7 units/ml Monitor platelets by anticoagulation protocol: Yes   Plan:  Reduce heparin slightly to 1750 units/h Daily heparin level and CBC  06/12/22, PharmD, [place],  [facility] Clinical Pharmacist [phone] Please check AMION for all [facility] Pharmacy numbers 06/10/2022

## 2022-06-10 NOTE — Progress Notes (Signed)
ANTICOAGULATION CONSULT NOTE   Pharmacy Consult for Heparin Indication: atrial fibrillation  No Known Allergies  Patient Measurements: Height: 6\' 4"  (193 cm) Weight: (Abnormal) 137.7 kg (303 lb 8 oz) IBW/kg (Calculated) : 86.8 Heparin Dosing Weight: 115 kg  Vital Signs: Temp: 97.8 F (36.6 C) (11/14 2213) Temp Source: Oral (11/14 2213) BP: 153/99 (11/14 2213) Pulse Rate: 84 (11/14 2213)  Labs: Recent Labs    06/08/22 2145 06/09/22 0124 06/09/22 0140 06/09/22 0413 06/09/22 2234  HGB 9.3*  --  14.3  --   --   HCT 28.4*  --  42.0  --   --   PLT 132*  --   --   --   --   HEPARINUNFRC  --   --   --   --  0.41  CREATININE 0.56* 0.99  --  1.02  --   TROPONINIHS 6 11  --   --   --      Estimated Creatinine Clearance: 112.4 mL/min (by C-G formula based on SCr of 1.02 mg/dL).   Medical History: Past Medical History:  Diagnosis Date   Atrial fibrillation (HCC)    Hypertension     Medications:  Medications Prior to Admission  Medication Sig Dispense Refill Last Dose   metoprolol succinate (TOPROL XL) 50 MG 24 hr tablet Take 1 tablet (50 mg total) by mouth daily. Take with or immediately following a meal. 60 tablet 0 Past Week at unk    Scheduled:    Infusions:   heparin 1,800 Units/hr (06/09/22 1614)   PRN: acetaminophen **OR** acetaminophen, hydrOXYzine  Assessment: 61 yom with a history of AF not on AC, EF, HTN. Patient is presenting with SOB. Heparin per pharmacy consult placed for atrial fibrillation.  Patient is not on anticoagulation prior to arrival. Historically, patient on coumadin but this was stopped following bleeding events.  Heparin level therapeutic:0.41 on 1800 units/hr, no infusion issues or overt s/sx of bleeding reported.   Goal of Therapy:  Heparin level 0.3-0.7 units/ml Monitor platelets by anticoagulation protocol: Yes   Plan:  Continue heparin infusion at 1800 units/hr Check anti-Xa level in 8 hours and daily while on  heparin Continue to monitor H&H and platelets  06/11/22, PharmD Clinical Pharmacist 06/10/2022 12:23 AM Please check AMION for all [facility] Pharmacy numbers

## 2022-06-10 NOTE — Progress Notes (Addendum)
Rounding Note    Patient Name: Riley Jefferson Date of Encounter: 06/10/2022  [facility] Cardiologist: Merlyn Lot?, Dr. Lynnette Caffey  Subjective   Pt states he has had no further episodes of chest pain since being in the hospital. He had chest pain in the setting of extreme anger. His breathing hasn't changed, found lying almost flat.  Inpatient Medications    Scheduled Meds:  metoprolol succinate  12.5 mg Oral Daily   Continuous Infusions:  heparin 1,750 Units/hr (06/10/22 1124)   PRN Meds: acetaminophen **OR** acetaminophen, hydrOXYzine, perflutren lipid microspheres (DEFINITY) IV suspension   Vital Signs    Vitals:   06/10/22 0553 06/10/22 0745 06/10/22 1040 06/10/22 1105  BP: (!) 122/102 (!) 158/103 (!) 153/105 (!) 157/139  Pulse: 90 81 77 89  Resp:  19 19 18   Temp: 97.7 F (36.5 C) (!) 97.5 F (36.4 C) 97.9 F (36.6 C) 97.6 F (36.4 C)  TempSrc: Oral Oral Oral Oral  SpO2: 95% 94% 97% 96%  Weight:      Height:        Intake/Output Summary (Last 24 hours) at 06/10/2022 1132 Last data filed at 06/10/2022 1124 Gross per 24 hour  Intake 410 ml  Output 2800 ml  Net -2390 ml      06/09/2022    9:10 PM 06/09/2022    3:26 PM 03/23/2022    8:27 PM  Last 3 Weights  Weight (lbs) 303 lb 8 oz 290 lb 290 lb  Weight (kg) 137.667 kg 131.543 kg 131.543 kg      Telemetry    Atrial fibrillation 60-90s, I do not see significant pauses or bradycardia - Personally Reviewed  ECG    No new tracings - Personally Reviewed  Physical Exam   GEN: No acute distress.   Neck: No JVD Cardiac: RRR, no murmurs, rubs, or gallops.  Respiratory: Clear to auscultation bilaterally. GI: Soft, nontender, non-distended  MS: No edema; No deformity. Neuro:  Nonfocal  Psych: Normal affect   Labs    High Sensitivity Troponin:   Recent Labs  Lab 06/08/22 2145 06/09/22 0124  TROPONINIHS 6 11     Chemistry Recent Labs  Lab 06/08/22 2145 06/09/22 0124 06/09/22 0140  06/09/22 0413 06/10/22 0851  NA 143 138 138 141 138  K 2.1* 3.7 5.2* 4.1 3.9  CL 126* 106  --  105 106  CO2 13* 24  --  25 25  GLUCOSE 62* 127*  --  92 95  BUN 11 16  --  15 10  CREATININE 0.56* 0.99  --  1.02 0.90  CALCIUM 4.9* 8.9  --  9.0 9.1  MG  --  2.3  --   --  2.1  PROT 3.6*  --   --   --  7.4  ALBUMIN 1.8*  --   --   --  3.4*  AST 12*  --   --   --  23  ALT 11  --   --   --  23  ALKPHOS 40  --   --   --  72  BILITOT 0.3  --   --   --  0.9  GFRNONAA >60 >60  --  >60 >60  ANIONGAP 4* 8  --  11 7    Lipids No results for input(s): "CHOL", "TRIG", "HDL", "LABVLDL", "LDLCALC", "CHOLHDL" in the last 168 hours.  Hematology Recent Labs  Lab 06/08/22 2145 06/09/22 0140 06/09/22 0413  WBC 4.3  --   --  RBC 3.26*  --  5.08  HGB 9.3* 14.3  --   HCT 28.4* 42.0  --   MCV 87.1  --   --   MCH 28.5  --   --   MCHC 32.7  --   --   RDW 16.0*  --   --   PLT 132*  --   --    Thyroid  Recent Labs  Lab 06/09/22 0413  TSH 1.019    BNP Recent Labs  Lab 06/08/22 2145  BNP 45.8    DDimer No results for input(s): "DDIMER" in the last 168 hours.   Radiology    DG Chest Port 1 View  Result Date: 06/08/2022 CLINICAL DATA:  Dyspnea EXAM: PORTABLE CHEST 1 VIEW COMPARISON:  None Available. FINDINGS: The heart size and mediastinal contours are within normal limits. Both lungs are clear. The visualized skeletal structures are unremarkable. IMPRESSION: No active disease. Electronically Signed   By: Fidela Salisbury M.D.   On: 06/08/2022 21:06    Cardiac Studies   Echo today  Patient Profile     61 y.o. male with a history of chronic atrial fibrillation not on anticoagulation, HFrEF with EF of 40-45% in 04/2019,  hypertension, and prior tobacco use who is being seen 06/09/2022 for the evaluation of pain and shortness of breath at the request of Dr. Roderic Palau.   Assessment & Plan    Shortness of breath, chest pain Hx of chronic DOE, breathing status is unchanged since prior to   Repeat echo today Reassuring PET stress 04/2019, chest pain sounds atypical and has not recurred since being admitted Depending on echo results, may opt for right and left heart cath for definitive angiography   Chronic systolic heart failure Hypertension  Echo 04/2019 with LVEF 40-45% mild LVH PET stress 04/2019 negative for ischemia or prior infarction   Chronic atrial fibrillation Dating back to 2004 Previously on coumadin, but discontinued due to significant bleeding (hemoptysis, hematuria, epistaxis,and bleeding from eyes) - unable to afford DOAC, so has not been anticoagulated - will check cost and see if patient assistance is possible - BB held for pauses ~2 sec - was on 50 mg toprol, now on hold - I do not appreciate significant bradycardia or pauses - will restart 12.5 mg toprol and monitor   Sleep apnea Will need a sleep study outpatient   For questions or updates, please contact [place] Please consult [email or web address] for contact info under        Signed, Ledora Bottcher, PA  06/10/2022, 11:32 AM     ATTENDING ATTESTATION:  After conducting a review of all available clinical information with the care team, interviewing the patient, and performing a physical exam, I agree with the findings and plan described in this note.   GEN: No acute distress.   HEENT:  MMM, no JVD, no scleral icterus Cardiac: RRR, no murmurs, rubs, or gallops.  Respiratory: Clear to auscultation bilaterally. GI: Soft, nontender, non-distended  MS: No edema; No deformity. Neuro:  Nonfocal  Vasc:  +2 radial pulses  Patient much improved and is net negative overnight.  Denies chest pain or dyspnea.  TTE unchanged.  He had a PET stress test which demonstrated no ischemia.  I believe his symptoms may be from mild systolic dysfunction and diastolic dysfunction.  Will start lasix 20mg  PO BID.  Will defer RHC today.  OK to discharge and obtain outpatient sleep study.  If still  dyspneic, would consider RHC to evaluate  further.    Lenna Sciara, MD Pager [phone]

## 2022-06-10 NOTE — Discharge Summary (Signed)
Physician Discharge Summary   Patient: Riley Jefferson MRN: [id number] DOB: [birth year]  Admit date:     06/08/2022  Discharge date: 06/10/22  Discharge Physician: Raiford Noble, DO   PCP: System, Provider Not In   Recommendations at discharge:   Follow up and establish with PCP within 1-2 weeks and Repeat CBC, CMP, Mag, Phos within 1 week Follow up with   Discharge Diagnoses: Principal Problem:   Hypokalemia Active Problems:   Metabolic acidosis   Hypocalcemia   Hypoglycemia   Atrial fibrillation with slow ventricular response (HCC)   Symptomatic bradycardia   Chest pain   Normocytic anemia   Thrombocytopenia (HCC)   Hypertension   Atrial fibrillation ([place])  Resolved Problems:   * No resolved hospital problems. [facility] Course: HPI per Dr. Shela Leff on 06/09/22 HPI: Riley Jefferson is a 61 y.o. male with medical history significant of A-fib, hypertension presented to the ED complaining of shortness of breath and chest tightness.  At triage, patient noted to have 2 episodes of symptomatic bradycardia with shortness of breath and lightheadedness with heart rate decreasing to the 30s.  Noted to be in A-fib and heart rate subsequently improved to the 60s to 70s.  Not hypoxic.  Labs showing hemoglobin 9.3, MCV 87.1, platelet count 132k, potassium 2.1, chloride 126, bicarb 13, anion gap 4, glucose 62, BUN 11, creatinine 0.5, calcium 4.9, albumin 1.8, total protein 3.6, initial troponin negative with repeat pending, BNP normal, magnesium level pending, VBG showing pH 7.44.  Chest x-ray showing no active disease. After oral carbohydrate intake, CBG improved to 135. Medications administered in the ED include oral potassium 40 mg x 2 and IV potassium 10 mg x 4, IV calcium gluconate 2 g, and IV magnesium 2 g.  TRH called to admit.   Patient is a poor historian.  States he came to the emergency room tonight as he suddenly became short of breath while sitting.  He denies experiencing  any chest pain or chest tightness.  Denies lightheadedness/dizziness, fevers, or cough.  He reports good appetite and denies any vomiting or diarrhea.  Reports history of A-fib for which he takes metoprolol but does not remember the dose.  No other complaints.   **Interim History Cardiology evaluated and his chest pain has resolved and shortness breath improved with diuresis.  His recent PET stress was reassuring and chest pain sounded atypical.  Echocardiogram done and had mild systolic dysfunction and diastolic dysfunction.  Cardiology recommended starting Lasix 20 once p.o. twice daily and deferring right heart cath to outpatient setting.  Cardiology recommended starting anticoagulation though was chronic atrial fibrillation and he was placed on a DOAC with Eliquis and resumed on metoprolol succinate 12.5 mg p.o. daily.  Patient ambulated without desaturating and was deemed medically stable to be discharged close PCP follow-up and outpatient follow-up with cardiology.  Assessment and Plan: No notes have been filed under this hospital service. Service: Hospitalist  Severe hypokalemia Potassium 2.1 on initial labs.  Etiology unclear.  No vomiting or diarrhea.  Not on diuretics.  After potassium replacement, repeat labs showing potassium 3.7.  Magnesium level is normal. -Cardiac monitoring -Continue to monitor potassium and replace if low but was stable at discharge at 3.9 and magnesium was stable at 2.1   ?Hyperchloremic (normal anion gap) metabolic acidosis Initial labs showing chloride 126, bicarb 13 with no elevation of anion gap.  Etiology unclear. ?Proximal (type 2) RTA given presence of unexplained severe hypokalemia, however, repeat labs showing chloride 106  and bicarb 24.  VBG showing normal pH. -Acidosis is improved   Hypocalcemia Calcium 6.7 when corrected for hypoalbuminemia.  Magnesium level is normal.  Not on any medications which would cause this.  Etiology unclear. -IV calcium  gluconate 2 g given -Check ionized calcium level, PTH, vitamin D level   Hypoglycemia Initial labs with glucose 62, improved to 135 after oral carbohydrate intake.  No history of diabetes. -Encourage p.o. intake -CBG checks ACHS  Shortness of breath and chest discomfort, improved -Cardiology felt this was atypical and recommended Lasix and metoprolol -Cardiology did echocardiogram and showed an EF of 40 to 45% with mildly decreased function and left ventricular demonstrated global hypokinesis and Definity contrast was given IV to delineate the left ventricular endocardial borders.  There is moderate left ventricular hypertrophy and left ventricular diastolic parameters were indeterminate -Cardiology recommending outpatient right heart cath and discharging home given that he is stable -Ambulatory home O2 screen done and patient did not desaturate  Chronic systolic CHF -Not in exacerbation but cardiology is changing to p.o. Lasix 20 mg twice daily -Strict I's and O's and daily weights and adhere to salt limited diet  Atrial fibrillation with slow ventricular response Symptomatic bradycardia -Noted to be in A-fib and symptomatic in triage with heart rate dropping to the 30s.  Rate currently improved to the 60s to 70s and asymptomatic.  Blood pressure stable. Bradycardia likely precipitated by electrolyte derangements.  Takes metoprolol at home. -Cardiac monitoring -Monitor electrolytes and continue to replace if low -Hold metoprolol at this time and avoid any other AV nodal blocking agents but cardiology recommends resuming -Check TSH was 1.019 -Cardiology starting recommending anticoagulation with DOAC with Eliquis   Acute EKG changes EKG showing new T wave inversions in lateral leads which could be due to electrolyte derangements.  Patient denies any chest pain.  Troponin negative x2 and not consistent with ACS. -Cardiac monitoring -Repeat EKG -Echocardiogram done and was essentially  the same as last time and cardiology recommends outpatient follow-up with deferring cardiac cath   Normocytic anemia Hemoglobin was 15.7 in April 2022 and now down to 9.3.  Patient is not endorsing any symptoms of GI bleeding. -Anemia panel done and showed an iron panel of 59, UIBC of 238, TIBC of 297, saturation ratios of 20, ferritin level 231, for level of 1.8, vitamin B-12 of 470 -FOBT   Mild thrombocytopenia No signs of active bleeding and improved from 1 30-1 80. -Continue to monitor   Hypertension -Pharmacy med rec pending -Hold metoprolol at this time given episodes of bradycardia  Obesity -Complicates overall prognosis and care -Estimated body mass index is 36.94 kg/m as calculated from the following:   Height as of this encounter: 6\' 4"  (1.93 m).   Weight as of this encounter: 137.7 kg.  -Weight Loss and Dietary Counseling given  Consultants: Cardiology  Procedures performed: Echocardiogram Disposition: Home Diet recommendation:  Cardiac diet DISCHARGE MEDICATION: Allergies as of 06/10/2022   No Known Allergies      Medication List     TAKE these medications    acetaminophen 325 MG tablet Commonly known as: TYLENOL Take 2 tablets (650 mg total) by mouth every 6 (six) hours as needed for mild pain (or Fever >/= 101).   Eliquis 5 MG Tabs tablet Generic drug: apixaban Take 1 tablet (5 mg total) by mouth 2 (two) times daily.   furosemide 20 MG tablet Commonly known as: LASIX Take 1 tablet (20 mg total) by mouth 2 (two) times  daily.   metoprolol succinate 25 MG 24 hr tablet Commonly known as: TOPROL-XL Take 0.5 tablets (12.5 mg total) by mouth daily. Start taking on: June 11, 2022 What changed:  medication strength how much to take additional instructions        Discharge Exam: [facility] Weights   06/09/22 1526 06/09/22 2110  Weight: 131.5 kg (!) 137.7 kg   Vitals:   06/10/22 1040 06/10/22 1105  BP: (!) 153/105 (!) 157/139  Pulse: 77 89   Resp: 19 18  Temp: 97.9 F (36.6 C) 97.6 F (36.4 C)  SpO2: 97% 96%   Examination: Physical Exam:  Constitutional: WN/WD obese African-American male currently no acute distress but appears hungry Respiratory: Diminished to auscultation bilaterally, no wheezing, rales, rhonchi or crackles. Normal respiratory effort and patient is not tachypenic. No accessory muscle use.  Unlabored breathing Cardiovascular: RRR, no murmurs / rubs / gallops. S1 and S2 auscultated.  Trace extremity edema Abdomen: Soft, non-tender, distended secondary by habitus. Bowel sounds positive.  GU: Deferred. Musculoskeletal: No clubbing / cyanosis of digits/nails. No joint deformity upper and lower extremities.  Skin: No rashes, lesions, ulcers on limited skin evaluation. No induration; Warm and dry.  Neurologic: CN 2-12 grossly intact with no focal deficits. Romberg sign and cerebellar reflexes not assessed.  Psychiatric: Normal judgment and insight. Alert and oriented x 3. Normal mood and appropriate affect.   Condition at discharge: stable  The results of significant diagnostics from this hospitalization (including imaging, microbiology, ancillary and laboratory) are listed below for reference.   Imaging Studies: ECHOCARDIOGRAM COMPLETE  Result Date: 06/10/2022    ECHOCARDIOGRAM REPORT   Patient Name:   RODIN PULLIUM Date of Exam: 06/10/2022 Medical Rec #:  [id number]      Height:       76.0 in Accession #:    [id number]     Weight:       303.5 lb Date of Birth:  July 28, 1960       BSA:          2.645 m Patient Age:    61 years       BP:           153/105 mmHg Patient Gender: M              HR:           71 bpm. Exam Location:  Inpatient Procedure: 2D Echo, Cardiac Doppler, Color Doppler and Intracardiac            Opacification Agent Indications:    Atrial Fibrillation I48.91  History:        Patient has no prior history of Echocardiogram examinations.                 Arrythmias:Atrial Fibrillation and Bradycardia,                  Signs/Symptoms:Chest Pain and Shortness of Breath; Risk                 Factors:Former Smoker and Hypertension.  Sonographer:    Greer Pickerel Referring Phys: [phone] [facility]  Sonographer Comments: Patient is obese, suboptimal parasternal window, suboptimal apical window and suboptimal subcostal window. Image acquisition challenging due to respiratory motion. IMPRESSIONS  1. Left ventricular ejection fraction, by estimation, is 40 to 45%. The left ventricle has mildly decreased function. The left ventricle demonstrates global hypokinesis. There is moderate left ventricular hypertrophy. Left ventricular diastolic parameters are indeterminate.  2. Right ventricular systolic function is  normal. The right ventricular size is normal. There is normal pulmonary artery systolic pressure. The estimated right ventricular systolic pressure is 123XX123 mmHg.  3. Left atrial size was moderately dilated.  4. The mitral valve is normal in structure. No evidence of mitral valve regurgitation. No evidence of mitral stenosis.  5. The aortic valve is tricuspid. Aortic valve regurgitation is not visualized. No aortic stenosis is present.  6. The inferior vena cava is normal in size with greater than 50% respiratory variability, suggesting right atrial pressure of 3 mmHg. FINDINGS  Left Ventricle: Left ventricular ejection fraction, by estimation, is 40 to 45%. The left ventricle has mildly decreased function. The left ventricle demonstrates global hypokinesis. Definity contrast agent was given IV to delineate the left ventricular  endocardial borders. The left ventricular internal cavity size was normal in size. There is moderate left ventricular hypertrophy. Left ventricular diastolic parameters are indeterminate. Right Ventricle: The right ventricular size is normal. No increase in right ventricular wall thickness. Right ventricular systolic function is normal. There is normal pulmonary artery systolic pressure. The  tricuspid regurgitant velocity is 1.95 m/s, and  with an assumed right atrial pressure of 8 mmHg, the estimated right ventricular systolic pressure is 123XX123 mmHg. Left Atrium: Left atrial size was moderately dilated. Right Atrium: Right atrial size was normal in size. Pericardium: There is no evidence of pericardial effusion. Mitral Valve: The mitral valve is normal in structure. No evidence of mitral valve regurgitation. No evidence of mitral valve stenosis. Tricuspid Valve: The tricuspid valve is normal in structure. Tricuspid valve regurgitation is trivial. No evidence of tricuspid stenosis. Aortic Valve: The aortic valve is tricuspid. Aortic valve regurgitation is not visualized. No aortic stenosis is present. Pulmonic Valve: The pulmonic valve was normal in structure. Pulmonic valve regurgitation is not visualized. No evidence of pulmonic stenosis. Aorta: The aortic root is normal in size and structure. Venous: The inferior vena cava is normal in size with greater than 50% respiratory variability, suggesting right atrial pressure of 3 mmHg. IAS/Shunts: No atrial level shunt detected by color flow Doppler.  LEFT VENTRICLE PLAX 2D LVIDd:         4.70 cm      Diastology LVIDs:         3.30 cm      LV e' medial:    6.42 cm/s LV PW:         1.50 cm      LV E/e' medial:  15.7 LV IVS:        1.40 cm      LV e' lateral:   8.16 cm/s LVOT diam:     2.10 cm      LV E/e' lateral: 12.4 LV SV:         59 LV SV Index:   22 LVOT Area:     3.46 cm  LV Volumes (MOD) LV vol d, MOD A2C: 128.0 ml LV vol d, MOD A4C: 136.0 ml LV vol s, MOD A2C: 76.9 ml LV vol s, MOD A4C: 78.5 ml LV SV MOD A2C:     51.1 ml LV SV MOD A4C:     136.0 ml LV SV MOD BP:      55.3 ml RIGHT VENTRICLE RV S prime:     7.07 cm/s TAPSE (M-mode): 2.2 cm LEFT ATRIUM              Index        RIGHT ATRIUM  Index LA diam:        4.60 cm  1.74 cm/m   RA Area:     19.10 cm LA Vol (A2C):   125.0 ml 47.26 ml/m  RA Volume:   50.90 ml  19.25 ml/m LA Vol (A4C):    96.0 ml  36.30 ml/m LA Biplane Vol: 110.0 ml 41.59 ml/m  AORTIC VALVE LVOT Vmax:   96.40 cm/s LVOT Vmean:  65.100 cm/s LVOT VTI:    0.169 m  AORTA Ao Root diam: 3.70 cm Ao Asc diam:  3.10 cm MITRAL VALVE                TRICUSPID VALVE MV Area (PHT): 4.52 cm     TR Peak grad:   15.2 mmHg MV Decel Time: 168 msec     TR Vmax:        195.00 cm/s MV E velocity: 101.00 cm/s                             SHUNTS                             Systemic VTI:  0.17 m                             Systemic Diam: 2.10 cm Riley Schultz MD Electronically signed by Riley Schultz MD Signature Date/Time: 06/10/2022/12:06:52 PM    Final    DG Chest Port 1 View  Result Date: 06/08/2022 CLINICAL DATA:  Dyspnea EXAM: PORTABLE CHEST 1 VIEW COMPARISON:  None Available. FINDINGS: The heart size and mediastinal contours are within normal limits. Both lungs are clear. The visualized skeletal structures are unremarkable. IMPRESSION: No active disease. Electronically Signed   By: Helyn Numbers M.D.   On: 06/08/2022 21:06    Microbiology: No results found for this or any previous visit.  Labs: CBC: Recent Labs  Lab 06/08/22 2145 06/09/22 0140 06/10/22 1110  WBC 4.3  --  6.8  NEUTROABS 1.9  --   --   HGB 9.3* 14.3 15.8  HCT 28.4* 42.0 45.8  MCV 87.1  --  80.5  PLT 132*  --  180   Basic Metabolic Panel: Recent Labs  Lab 06/08/22 2145 06/09/22 0124 06/09/22 0140 06/09/22 0413 06/10/22 0851  NA 143 138 138 141 138  K 2.1* 3.7 5.2* 4.1 3.9  CL 126* 106  --  105 106  CO2 13* 24  --  25 25  GLUCOSE 62* 127*  --  92 95  BUN 11 16  --  15 10  CREATININE 0.56* 0.99  --  1.02 0.90  CALCIUM 4.9* 8.9  --  9.0 9.1  MG  --  2.3  --   --  2.1  PHOS  --   --   --   --  3.0   Liver Function Tests: Recent Labs  Lab 06/08/22 2145 06/10/22 0851  AST 12* 23  ALT 11 23  ALKPHOS 40 72  BILITOT 0.3 0.9  PROT 3.6* 7.4  ALBUMIN 1.8* 3.4*   CBG: Recent Labs  Lab 06/09/22 1147 06/09/22 1702 06/09/22 2236 06/10/22 0634  06/10/22 1102  GLUCAP 97 76 111* 91 91    Discharge time spent: greater than 30 minutes.  Signed: Marguerita Merles, DO [facility] 06/10/2022

## 2022-06-10 NOTE — Progress Notes (Signed)
  Echocardiogram 2D Echocardiogram has been performed.  Riley Jefferson 06/10/2022, 11:07 AM

## 2022-06-10 NOTE — Progress Notes (Signed)
SATURATION QUALIFICATIONS: (This note is used to comply with regulatory documentation for home oxygen)  Patient Saturations on Room Air at Rest = 98%  Patient Saturations on Room Air while Ambulating = 99%  Patient Saturations on n/a Liters of oxygen while Ambulating = n/a%  Please briefly explain why patient needs home oxygen:

## 2022-06-15 NOTE — Progress Notes (Unsigned)
Cardiology Office Note:    Date:  06/16/2022   ID:  Rochele Pages, DOB [birth year], MRN [id number]  PCP:  System, Provider Not In   [facility] Prov[facility] Cardiologist:  [name], MD     Referring MD: No ref. provider found   Chief Complaint: hospital follow-up  History of Present Illness:    Riley Jefferson is a pleasant 61 y.o. male with a hx of chronic atrial fibrillation on chronic anticoagulation, HFrEF with EF 40-45% in 04/2019, HTN, and prior tobacco use  Admission 11/13-11/15/23 seen by cardiology for evaluation of pain and shortness of breath.  Has been followed by cardiology in [place]. Atrial fibrillation dating back to 2004, was previously on Coumadin but unable to tolerate due to significant bleeding (hemoptysis, hematuria, epistaxis, and bleeding from eyes). Not able to afford DOACs. Per review of notes in Care Everywhere, long history of dyspnea on exertion.  Prior Myoview 09/2015 showed no evidence of ischemia, EF 31%.  Echo at that time showed LVEF 40 to 45% and severe left atrial enlargement.  Admitted at a hospital in [place] in 04/2022 for sudden onset of shortness of breath and then developed chest pain in the ED.  PET stress 04/2019 showed no evidence of ischemia or infarction.  Echo at that time showed LVEF 40 to 45% with reduced global longitudinal strain of -7% and mild LVH. He moved to [facility] in 06/2021 and has not yet established with PCP or cardiologist.  He presented to ED with sudden onset of shortness of breath in the setting of being very upset with someone at work.  Reports intermittent shortness of breath and chest pain also outside of panic attacks.  Symptoms usually last for couple of minutes and then resolve on their own.  Sometimes symptoms worsen with the and improved when going inside.  Usually feels his heart racing.  Has been told he snores and stops breathing at night and will occasionally wake up gasping for air. Occasional  lightheadedness/dizziness especially if he is outside in the heat, no syncope.  EKG showed atrial fibrillation at 68 bpm with LVH and mild ST depression and T wave inversions in V5 and 6.  High-sensitivity troponin negative x2, BNP normal.  CXR with no acute findings.  K 2.1. Telemetry revealed occasional pauses 2.22 sec with HR in the 40s around those times but very brief. Potassium normalized with repletion.  He was placed on Eliquis.  Shortness of breath and chest discomfort improved, felt to be atypical for angina.  He was placed on Lasix 20 mg twice daily for LVEF 40-45%. He was discharged on 06/10/22.  Today, he is here alone for follow-up. Reports he feels shortness of breathing and breathing frequently. Woke up this morning breathing hard. When he sits and slows his breathing down, breathing improves. Has some mid sternal chest pressure that occurs randomly, thinks it may occur more often when he is having problems getting his breath.  Is very concerned about his heart and wants to do everything we ask him to do. 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 At times, he gets anxious because he does not feel like he can breath good while at work. No diaphoresis or n/v. He denies lower extremity edema, fatigue, palpitations, melena, hematuria, hemoptysis, diaphoresis, weakness, presyncope, syncope, orthopnea, and PND.  We have asked for social work to be involved in patient's  care.  Past Medical History:  Diagnosis Date   Atrial fibrillation ([place])    Hypertension     History reviewed. No pertinent surgical history.  Current Medications: Current Meds  Medication Sig   acetaminophen (TYLENOL) 325 MG tablet Take 2 tablets (650 mg total) by mouth every 6 (six) hours as needed for mild pain (or Fever >/= 101).   furosemide (LASIX) 20 MG tablet Take 1 tablet (20 mg total) by mouth 2 (two) times daily.   metoprolol  succinate (TOPROL-XL) 25 MG 24 hr tablet Take 0.5 tablets (12.5 mg total) by mouth daily.   sacubitril-valsartan (ENTRESTO) 49-51 MG Take 1 tablet by mouth 2 (two) times daily.   [DISCONTINUED] apixaban (ELIQUIS) 5 MG TABS tablet Take 1 tablet (5 mg total) by mouth 2 (two) times daily.     Allergies:   Patient has no known allergies.   Social History   Socioeconomic History   Marital status: Single    Spouse name: Not on file   Number of children: Not on file   Years of education: Not on file   Highest education level: Not on file  Occupational History   Not on file  Tobacco Use   Smoking status: Former    Packs/day: 1.00    Years: 20.00    Total pack years: 20.00    Types: Cigarettes    Quit date: 09/21/2001    Years since quitting: 20.7   Smokeless tobacco: Never  Vaping Use   Vaping Use: Never used  Substance and Sexual Activity   Alcohol use: Never   Drug use: Never   Sexual activity: Not on file  Other Topics Concern   Not on file  Social History Narrative   Not on file   Social Determinants of Health   Financial Resource Strain: Not on file  Food Insecurity: Not on file  Transportation Needs: Not on file  Physical Activity: Not on file  Stress: Not on file  Social Connections: Not on file     Family History: The patient's family history includes Stroke in his father.  ROS:   Please see the history of present illness.    + DOE + occasional chest pressure All other systems reviewed and are negative.  Labs/Other Studies Reviewed:    The following studies were reviewed today:  Echo 06/10/2022 1. Left ventricular ejection fraction, by estimation, is 40 to 45%. The  left ventricle has mildly decreased function. The left ventricle  demonstrates global hypokinesis. There is moderate left ventricular  hypertrophy. Left ventricular diastolic  parameters are indeterminate.   2. Right ventricular systolic function is normal. The right ventricular  size is  normal. There is normal pulmonary artery systolic pressure. The  estimated right ventricular systolic pressure is 123XX123 mmHg.   3. Left atrial size was moderately dilated.   4. The mitral valve is normal in structure. No evidence of mitral valve  regurgitation. No evidence of mitral stenosis.   5. The aortic valve is tricuspid. Aortic valve regurgitation is not  visualized. No aortic stenosis is present.   6. The inferior vena cava is normal in size with greater than 50%  respiratory variability, suggesting right atrial pressure of 3 mmHg.    PET Stress Test 05/08/2019 [facility]): Impressions: 1. No evidence of ischemia or infarction  2. Normal left wall motion  3. Abnormal decreased ejection fraction  4. Normal coronary flow reserve  5. TID is normal  6. No significant coronary artery calcification noted on  CT obtained for attenuation correction  7. This is a low risk scan  8. Small pulmonary nodule seen at the left lower lobe    Echocardiogram 05/08/2019 [facility]): Impressions:  1. Overall left ventricular ejection fraction is estimated at 40 to 45%.   2. Mildly to moderately decreased global left ventricular systolic function.   3. Mild septal left ventricular hypertrophy.   4. Mild to moderately increased left ventricular internal cavity size.   5. Mildly dilated left atrium.   6. Mild mitral annular calcification.   7. GLS -7% (reduced).   Recent Labs: 06/08/2022: B Natriuretic Peptide 45.8 06/09/2022: TSH 1.019 06/10/2022: ALT 23; BUN 10; Creatinine, Ser 0.90; Hemoglobin 15.8; Magnesium 2.1; Platelets 180; Potassium 3.9; Sodium 138  Recent Lipid Panel No results found for: "CHOL", "TRIG", "HDL", "CHOLHDL", "VLDL", "LDLCALC", "LDLDIRECT"   Risk Assessment/Calculations:    CHA2DS2-VASc Score = 2  {This indicates a 2.2% annual risk of stroke. The patient's score is based upon: CHF History: 1 HTN History: 1 Diabetes History: 0 Stroke History:  0 Vascular Disease History: 0 Age Score: 0 Gender Score: 0    Physical Exam:    VS:  BP (!) 140/90   Pulse 85   Ht 6\' 4"  (1.93 m)   Wt (!) 304 lb (137.9 kg)   SpO2 99%   BMI 37.00 kg/m     Wt Readings from Last 3 Encounters:  06/16/22 (!) 304 lb (137.9 kg)  06/09/22 (!) 303 lb 8 oz (137.7 kg)  03/23/22 290 lb (131.5 kg)     GEN: Obese in no acute distress HEENT: Normal NECK: No JVD; No carotid bruits CARDIAC: RRR, no murmurs, rubs, gallops RESPIRATORY:  Clear to auscultation without rales, wheezing or rhonchi  ABDOMEN: Soft, non-tender, non-distended MUSCULOSKELETAL:  No edema; No deformity. 2+ pedal pulses, equal bilaterally SKIN: Warm and dry NEUROLOGIC:  Alert and oriented x 3 PSYCHIATRIC:  Normal affect   EKG:  EKG is not ordered today.    HYPERTENSION CONTROL Vitals:   06/16/22 1115 06/16/22 1233  BP: (!) 146/88 (!) 140/90    The patient's blood pressure is elevated above target today.  In order to address the patient's elevated BP: A new medication was prescribed today.     Diagnoses:    1. Atrial fibrillation with slow ventricular response ([place])   2. Primary hypertension   3. Acute on chronic systolic congestive heart failure ([place])   4. DOE (dyspnea on exertion)   5. Chest pain on breathing    Assessment and Plan:     DOE/Chest pressure: Chest pressure that occurs most frequently when he anxious or having difficulty breathing. As noted below, recent diagnosis of HFrEF. PET stress test 04/2019 reassuring. Will defer ischemia evaluation for now in hopes that breathing and chest pressure improve on GDMT for HFrEF. Consider R/LHC at next office visit.    HFrEF: LVEF 40 to 45%, global HK of LV, indeterminate diastolic parameters, normal RV, no significant valve disease on echo 06/10/22.  He is obese and it is difficult to assess for fluid overload.  He denies dyspnea, edema, orthopnea. Woke up gasping for air this morning. Breathing improved within a few  minutes. Need to up-titrate GDMT. Will start Entresto and try to get patient assistance for him. He will return in 2 weeks for bmet.  Low-sodium heart healthy diet and weight loss encouraged.  Atrial fibrillation: HR is well-controlled. Does not describe specific symptoms of a fib. No bleeding concerns on Eliquis.  Eliquis  samples given and patient assistance forms reviewed with him. He says he has a daughter who can help him complete paperwork. Continue Eliquis, metoprolol.   Sleep apnea: Stop bang score is 8.  Does not currently have insurance. We will consider outpatient sleep study once he is insured.     Disposition: 1 month with me  Medication Adjustments/Labs and Tests Ordered: Current medicines are reviewed at length with the patient today.  Concerns regarding medicines are outlined above.  Orders Placed This Encounter  Procedures   Basic Metabolic Panel (BMET)   Meds ordered this encounter  Medications   apixaban (ELIQUIS) 5 MG TABS tablet    Sig: Take 1 tablet (5 mg total) by mouth 2 (two) times daily.    Dispense:  60 tablet    Refill:  11   sacubitril-valsartan (ENTRESTO) 49-51 MG    Sig: Take 1 tablet by mouth 2 (two) times daily.    Dispense:  60 tablet    Refill:  11    Please Honor Card patient is presenting for Carmie Kanner: [id number]; Juanna CaoFX:[id number]; [id number]: [facility]; [id number]    Patient Instructions  Medication Instructions:   Terald Sleeper one (1) tablet by mouth (49-51 mg) twice daily.   *If you need a refill on your cardiac medications before your next appointment, please call your pharmacy*   Lab Work:  Your physician recommends that you return for lab work on Tuesday, December 5. You can come in on the day of your appointment anytime between 7:30-4:30.   If you have labs (blood work) drawn today and your tests are completely normal, you will receive your results only by: [place] (if you have MyChart) OR A paper copy in the mail If you  have any lab test that is abnormal or we need to change your treatment, we will call you to review the results.   Testing/Procedures:  None ordered.   Follow-Up: At [facility], you and your health needs are our priority.  As part of our continuing mission to provide you with exceptional heart care, we have created designated Provider Care Teams.  These Care Teams include your primary Cardiologist (physician) and Advanced Practice Providers (APPs -  Physician Assistants and Nurse Practitioners) who all work together to provide you with the care you need, when you need it.  We recommend signing up for the patient portal called "MyChart".  Sign up information is provided on this After Visit Summary.  MyChart is used to connect with patients for Virtual Visits (Telemedicine).  Patients are able to view lab/test results, encounter notes, upcoming appointments, etc.  Non-urgent messages can be sent to your provider as well.   To learn more about what you can do with MyChart, go to [email or web address].    Your next appointment:   1 month(s)  The format for your next appointment:   In Person  Provider:   Christen Bame, NP         Other Instructions  Patient was given a work note today.   DASH Eating Plan DASH stands for Dietary Approaches to Stop Hypertension. The DASH eating plan is a healthy eating plan that has been shown to: Reduce high blood pressure (hypertension). Reduce your risk for type 2 diabetes, heart disease, and stroke. Help with weight loss. What are tips for following this plan? Reading food labels Check food labels for the amount of salt (sodium) per serving. Choose foods with less than 5 percent of the Daily Value of  sodium. Generally, foods with less than 300 milligrams (mg) of sodium per serving fit into this eating plan. To find whole grains, look for the word "whole" as the first word in the ingredient list. Shopping Buy products labeled as  "low-sodium" or "no salt added." Buy fresh foods. Avoid canned foods and pre-made or frozen meals. Cooking Avoid adding salt when cooking. Use salt-free seasonings or herbs instead of table salt or sea salt. Check with your health care provider or pharmacist before using salt substitutes. Do not fry foods. Cook foods using healthy methods such as baking, boiling, grilling, roasting, and broiling instead. Cook with heart-healthy oils, such as olive, canola, avocado, soybean, or sunflower oil. Meal planning  Eat a balanced diet that includes: 4 or more servings of fruits and 4 or more servings of vegetables each day. Try to fill one-half of your plate with fruits and vegetables. 6-8 servings of whole grains each day. Less than 6 oz (170 g) of lean meat, poultry, or fish each day. A 3-oz (85-g) serving of meat is about the same size as a deck of cards. One egg equals 1 oz (28 g). 2-3 servings of low-fat dairy each day. One serving is 1 cup (237 mL). 1 serving of nuts, seeds, or beans 5 times each week. 2-3 servings of heart-healthy fats. Healthy fats called omega-3 fatty acids are found in foods such as walnuts, flaxseeds, fortified milks, and eggs. These fats are also found in cold-water fish, such as sardines, salmon, and mackerel. Limit how much you eat of: Canned or prepackaged foods. Food that is high in trans fat, such as some fried foods. Food that is high in saturated fat, such as fatty meat. Desserts and other sweets, sugary drinks, and other foods with added sugar. Full-fat dairy products. Do not salt foods before eating. Do not eat more than 4 egg yolks a week. Try to eat at least 2 vegetarian meals a week. Eat more home-cooked food and less restaurant, buffet, and fast food. Lifestyle When eating at a restaurant, ask that your food be prepared with less salt or no salt, if possible. If you drink alcohol: Limit how much you use to: 0-1 drink a day for women who are not  pregnant. 0-2 drinks a day for men. Be aware of how much alcohol is in your drink. In the [place] one drink equals one 12 oz bottle of beer (355 mL), one 5 oz glass of wine (148 mL), or one 1 oz glass of hard liquor (44 mL). General information Avoid eating more than 2,300 mg of salt a day. If you have hypertension, you may need to reduce your sodium intake to 1,500 mg a day. Work with your health care provider to maintain a healthy body weight or to lose weight. Ask what an ideal weight is for you. Get at least 30 minutes of exercise that causes your heart to beat faster (aerobic exercise) most days of the week. Activities may include walking, swimming, or biking. Work with your health care provider or dietitian to adjust your eating plan to your individual calorie needs. What foods should I eat? Fruits All fresh, dried, or frozen fruit. Canned fruit in natural juice (without added sugar). Vegetables Fresh or frozen vegetables (raw, steamed, roasted, or grilled). Low-sodium or reduced-sodium tomato and vegetable juice. Low-sodium or reduced-sodium tomato sauce and tomato paste. Low-sodium or reduced-sodium canned vegetables. Grains Whole-grain or whole-wheat bread. Whole-grain or whole-wheat pasta. Brown rice. Modena Morrow. Bulgur. Whole-grain and low-sodium cereals.  Pita bread. Low-fat, low-sodium crackers. Whole-wheat flour tortillas. Meats and other proteins Skinless chicken or [place]. Ground chicken or [place]. Pork with fat trimmed off. Fish and seafood. Egg whites. Dried beans, peas, or lentils. Unsalted nuts, nut butters, and seeds. Unsalted canned beans. Lean cuts of beef with fat trimmed off. Low-sodium, lean precooked or cured meat, such as sausages or meat loaves. Dairy Low-fat (1%) or fat-free (skim) milk. Reduced-fat, low-fat, or fat-free cheeses. Nonfat, low-sodium ricotta or cottage cheese. Low-fat or nonfat yogurt. Low-fat, low-sodium cheese. Fats and oils Soft margarine without  trans fats. Vegetable oil. Reduced-fat, low-fat, or light mayonnaise and salad dressings (reduced-sodium). Canola, safflower, olive, avocado, soybean, and sunflower oils. Avocado. Seasonings and condiments Herbs. Spices. Seasoning mixes without salt. Other foods Unsalted popcorn and pretzels. Fat-free sweets. The items listed above may not be a complete list of foods and beverages you can eat. Contact a dietitian for more information. What foods should I avoid? Fruits Canned fruit in a light or heavy syrup. Fried fruit. Fruit in cream or butter sauce. Vegetables Creamed or fried vegetables. Vegetables in a cheese sauce. Regular canned vegetables (not low-sodium or reduced-sodium). Regular canned tomato sauce and paste (not low-sodium or reduced-sodium). Regular tomato and vegetable juice (not low-sodium or reduced-sodium). Rosita Fire. Olives. Grains Baked goods made with fat, such as croissants, muffins, or some breads. Dry pasta or rice meal packs. Meats and other proteins Fatty cuts of meat. Ribs. Fried meat. Tomasa Blase. Bologna, salami, and other precooked or cured meats, such as sausages or meat loaves. Fat from the back of a pig (fatback). Bratwurst. Salted nuts and seeds. Canned beans with added salt. Canned or smoked fish. Whole eggs or egg yolks. Chicken or [place] with skin. Dairy Whole or 2% milk, cream, and half-and-half. Whole or full-fat cream cheese. Whole-fat or sweetened yogurt. Full-fat cheese. Nondairy creamers. Whipped toppings. Processed cheese and cheese spreads. Fats and oils Butter. Stick margarine. Lard. Shortening. Ghee. Bacon fat. Tropical oils, such as coconut, palm kernel, or palm oil. Seasonings and condiments Onion salt, garlic salt, seasoned salt, table salt, and sea salt. Worcestershire sauce. Tartar sauce. Barbecue sauce. Teriyaki sauce. Soy sauce, including reduced-sodium. Steak sauce. Canned and packaged gravies. Fish sauce. Oyster sauce. Cocktail sauce. Store-bought  horseradish. Ketchup. Mustard. Meat flavorings and tenderizers. Bouillon cubes. Hot sauces. Pre-made or packaged marinades. Pre-made or packaged taco seasonings. Relishes. Regular salad dressings. Other foods Salted popcorn and pretzels. The items listed above may not be a complete list of foods and beverages you should avoid. Contact a dietitian for more information. Where to find more information National Heart, Lung, and Blood Institute: PopSteam.is American Heart Association: www.heart.org Academy of Nutrition and Dietetics: www.eatright.org National Kidney Foundation: www.kidney.org Summary The DASH eating plan is a healthy eating plan that has been shown to reduce high blood pressure (hypertension). It may also reduce your risk for type 2 diabetes, heart disease, and stroke. When on the DASH eating plan, aim to eat more fresh fruits and vegetables, whole grains, lean proteins, low-fat dairy, and heart-healthy fats. With the DASH eating plan, you should limit salt (sodium) intake to 2,300 mg a day. If you have hypertension, you may need to reduce your sodium intake to 1,500 mg a day. Work with your health care provider or dietitian to adjust your eating plan to your individual calorie needs. This information is not intended to replace advice given to you by your health care provider. Make sure you discuss any questions you have with your health care provider.  Document Revised: 06/16/2019 Document Reviewed: 06/16/2019 Elsevier Patient Education  2023 Elsevier Inc. Mediterranean Diet A Mediterranean diet refers to food and lifestyle choices that are based on the traditions of countries located on the [employer]. It focuses on eating more fruits, vegetables, whole grains, beans, nuts, seeds, and heart-healthy fats, and eating less dairy, meat, eggs, and processed foods with added sugar, salt, and fat. This way of eating has been shown to help prevent certain conditions and improve  outcomes for people who have chronic diseases, like kidney disease and heart disease. What are tips for following this plan? Reading food labels Check the serving size of packaged foods. For foods such as rice and pasta, the serving size refers to the amount of cooked product, not dry. Check the total fat in packaged foods. Avoid foods that have saturated fat or trans fats. Check the ingredient list for added sugars, such as corn syrup. Shopping  Buy a variety of foods that offer a balanced diet, including: Fresh fruits and vegetables (produce). Grains, beans, nuts, and seeds. Some of these may be available in unpackaged forms or large amounts (in bulk). Fresh seafood. Poultry and eggs. Low-fat dairy products. Buy whole ingredients instead of prepackaged foods. Buy fresh fruits and vegetables in-season from local farmers markets. Buy plain frozen fruits and vegetables. If you do not have access to quality fresh seafood, buy precooked frozen shrimp or canned fish, such as tuna, salmon, or sardines. Stock your pantry so you always have certain foods on hand, such as olive oil, canned tuna, canned tomatoes, rice, pasta, and beans. Cooking Cook foods with extra-virgin olive oil instead of using butter or other vegetable oils. Have meat as a side dish, and have vegetables or grains as your main dish. This means having meat in small portions or adding small amounts of meat to foods like pasta or stew. Use beans or vegetables instead of meat in common dishes like chili or lasagna. Experiment with different cooking methods. Try roasting, broiling, steaming, and sauting vegetables. Add frozen vegetables to soups, stews, pasta, or rice. Add nuts or seeds for added healthy fats and plant protein at each meal. You can add these to yogurt, salads, or vegetable dishes. Marinate fish or vegetables using olive oil, lemon juice, garlic, and fresh herbs. Meal planning Plan to eat one vegetarian meal one  day each week. Try to work up to two vegetarian meals, if possible. Eat seafood two or more times a week. Have healthy snacks readily available, such as: Vegetable sticks with hummus. Greek yogurt. Fruit and nut trail mix. Eat balanced meals throughout the week. This includes: Fruit: 2-3 servings a day. Vegetables: 4-5 servings a day. Low-fat dairy: 2 servings a day. Fish, poultry, or lean meat: 1 serving a day. Beans and legumes: 2 or more servings a week. Nuts and seeds: 1-2 servings a day. Whole grains: 6-8 servings a day. Extra-virgin olive oil: 3-4 servings a day. Limit red meat and sweets to only a few servings a month. Lifestyle  Cook and eat meals together with your family, when possible. Drink enough fluid to keep your urine pale yellow. Be physically active every day. This includes: Aerobic exercise like running or swimming. Leisure activities like gardening, walking, or housework. Get 7-8 hours of sleep each night. If recommended by your health care provider, drink red wine in moderation. This means 1 glass a day for nonpregnant women and 2 glasses a day for men. A glass of wine equals 5 oz (150  mL). What foods should I eat? Fruits Apples. Apricots. Avocado. Berries. Bananas. Cherries. Dates. Figs. Grapes. Lemons. Melon. Oranges. Peaches. Plums. Pomegranate. Vegetables Artichokes. Beets. Broccoli. Cabbage. Carrots. Eggplant. Green beans. Chard. Kale. Spinach. Onions. Leeks. Peas. Squash. Tomatoes. Peppers. Radishes. Grains Whole-grain pasta. Brown rice. Bulgur wheat. Polenta. Couscous. Whole-wheat bread. [place]. Meats and other proteins Beans. Almonds. Sunflower seeds. Pine nuts. Peanuts. [place]. Salmon. Scallops. Shrimp. [place]. Tilapia. Clams. Oysters. Eggs. Poultry without skin. Dairy Low-fat milk. Cheese. Greek yogurt. Fats and oils Extra-virgin olive oil. Avocado oil. Grapeseed oil. Beverages Water. Red wine. Herbal tea. Sweets and desserts Greek yogurt  with honey. Baked apples. Poached pears. Trail mix. Seasonings and condiments Basil. Cilantro. Coriander. Cumin. Mint. Parsley. Sage. Rosemary. Tarragon. Garlic. Oregano. Thyme. Pepper. Balsamic vinegar. Tahini. Hummus. Tomato sauce. Olives. Mushrooms. The items listed above may not be a complete list of foods and beverages you can eat. Contact a dietitian for more information. What foods should I limit? This is a list of foods that should be eaten rarely or only on special occasions. Fruits Fruit canned in syrup. Vegetables Deep-fried potatoes (french fries). Grains Prepackaged pasta or rice dishes. Prepackaged cereal with added sugar. Prepackaged snacks with added sugar. Meats and other proteins Beef. Pork. Lamb. Poultry with skin. Hot dogs. Berniece Salines. Dairy Ice cream. Sour cream. Whole milk. Fats and oils Butter. Canola oil. Vegetable oil. Beef fat (tallow). Lard. Beverages Juice. Sugar-sweetened soft drinks. Beer. Liquor and spirits. Sweets and desserts Cookies. Cakes. Pies. Candy. Seasonings and condiments Mayonnaise. Pre-made sauces and marinades. The items listed above may not be a complete list of foods and beverages you should limit. Contact a dietitian for more information. Summary The Mediterranean diet includes both food and lifestyle choices. Eat a variety of fresh fruits and vegetables, beans, nuts, seeds, and whole grains. Limit the amount of red meat and sweets that you eat. If recommended by your health care provider, drink red wine in moderation. This means 1 glass a day for nonpregnant women and 2 glasses a day for men. A glass of wine equals 5 oz (150 mL). This information is not intended to replace advice given to you by your health care provider. Make sure you discuss any questions you have with your health care provider. Document Revised: 08/18/2019 Document Reviewed: 06/15/2019 Elsevier Patient Education  [place] quicker you return assistance  paperwork to this office at the front desk the sooner we can help you with free medications.   Important Information About Sugar         Signed, Emmaline Life, NP  06/16/2022 12:44 PM    [place]

## 2022-06-16 ENCOUNTER — Ambulatory Visit: Payer: Medicaid Other | Attending: Nurse Practitioner | Admitting: Nurse Practitioner

## 2022-06-16 ENCOUNTER — Encounter: Payer: Self-pay | Admitting: Nurse Practitioner

## 2022-06-16 ENCOUNTER — Telehealth: Payer: Self-pay | Admitting: Licensed Clinical Social Worker

## 2022-06-16 VITALS — BP 140/90 | HR 85 | Ht 76.0 in | Wt 304.0 lb

## 2022-06-16 DIAGNOSIS — R0609 Other forms of dyspnea: Secondary | ICD-10-CM

## 2022-06-16 DIAGNOSIS — R071 Chest pain on breathing: Secondary | ICD-10-CM

## 2022-06-16 DIAGNOSIS — I5023 Acute on chronic systolic (congestive) heart failure: Secondary | ICD-10-CM

## 2022-06-16 DIAGNOSIS — I10 Essential (primary) hypertension: Secondary | ICD-10-CM

## 2022-06-16 DIAGNOSIS — I4891 Unspecified atrial fibrillation: Secondary | ICD-10-CM

## 2022-06-16 MED ORDER — APIXABAN 5 MG PO TABS: 5.0000 mg | ORAL_TABLET | Freq: Two times a day (BID) | ORAL | 11 refills | Status: DC

## 2022-06-16 MED ORDER — ENTRESTO 49-51 MG PO TABS: 1.0000 | ORAL_TABLET | Freq: Two times a day (BID) | ORAL | 11 refills | Status: DC

## 2022-06-16 NOTE — Patient Instructions (Signed)
Medication Instructions:   START Entresto one (1) tablet by mouth (49-51 mg) twice daily.   *If you need a refill on your cardiac medications before your next appointment, please call your pharmacy*   Lab Work:  Your physician recommends that you return for lab work on Tuesday, December 5. You can come in on the day of your appointment anytime between 7:30-4:30.   If you have labs (blood work) drawn today and your tests are completely normal, you will receive your results only by: [place] (if you have MyChart) OR A paper copy in the mail If you have any lab test that is abnormal or we need to change your treatment, we will call you to review the results.   Testing/Procedures:  None ordered.   Follow-Up: At [facility], you and your health needs are our priority.  As part of our continuing mission to provide you with exceptional heart care, we have created designated Provider Care Teams.  These Care Teams include your primary Cardiologist (physician) and Advanced Practice Providers (APPs -  Physician Assistants and Nurse Practitioners) who all work together to provide you with the care you need, when you need it.  We recommend signing up for the patient portal called "MyChart".  Sign up information is provided on this After Visit Summary.  MyChart is used to connect with patients for Virtual Visits (Telemedicine).  Patients are able to view lab/test results, encounter notes, upcoming appointments, etc.  Non-urgent messages can be sent to your provider as well.   To learn more about what you can do with MyChart, go to [email or web address].    Your next appointment:   1 month(s)  The format for your next appointment:   In Person  Provider:   Christen Bame, NP         Other Instructions  Patient was given a work note today.   DASH Eating Plan DASH stands for Dietary Approaches to Stop Hypertension. The DASH eating plan is a healthy eating plan that has  been shown to: Reduce high blood pressure (hypertension). Reduce your risk for type 2 diabetes, heart disease, and stroke. Help with weight loss. What are tips for following this plan? Reading food labels Check food labels for the amount of salt (sodium) per serving. Choose foods with less than 5 percent of the Daily Value of sodium. Generally, foods with less than 300 milligrams (mg) of sodium per serving fit into this eating plan. To find whole grains, look for the word "whole" as the first word in the ingredient list. Shopping Buy products labeled as "low-sodium" or "no salt added." Buy fresh foods. Avoid canned foods and pre-made or frozen meals. Cooking Avoid adding salt when cooking. Use salt-free seasonings or herbs instead of table salt or sea salt. Check with your health care provider or pharmacist before using salt substitutes. Do not fry foods. Cook foods using healthy methods such as baking, boiling, grilling, roasting, and broiling instead. Cook with heart-healthy oils, such as olive, canola, avocado, soybean, or sunflower oil. Meal planning  Eat a balanced diet that includes: 4 or more servings of fruits and 4 or more servings of vegetables each day. Try to fill one-half of your plate with fruits and vegetables. 6-8 servings of whole grains each day. Less than 6 oz (170 g) of lean meat, poultry, or fish each day. A 3-oz (85-g) serving of meat is about the same size as a deck of cards. One egg equals 1 oz (28 g).  2-3 servings of low-fat dairy each day. One serving is 1 cup (237 mL). 1 serving of nuts, seeds, or beans 5 times each week. 2-3 servings of heart-healthy fats. Healthy fats called omega-3 fatty acids are found in foods such as walnuts, flaxseeds, fortified milks, and eggs. These fats are also found in cold-water fish, such as sardines, salmon, and mackerel. Limit how much you eat of: Canned or prepackaged foods. Food that is high in trans fat, such as some fried  foods. Food that is high in saturated fat, such as fatty meat. Desserts and other sweets, sugary drinks, and other foods with added sugar. Full-fat dairy products. Do not salt foods before eating. Do not eat more than 4 egg yolks a week. Try to eat at least 2 vegetarian meals a week. Eat more home-cooked food and less restaurant, buffet, and fast food. Lifestyle When eating at a restaurant, ask that your food be prepared with less salt or no salt, if possible. If you drink alcohol: Limit how much you use to: 0-1 drink a day for women who are not pregnant. 0-2 drinks a day for men. Be aware of how much alcohol is in your drink. In the [place] one drink equals one 12 oz bottle of beer (355 mL), one 5 oz glass of wine (148 mL), or one 1 oz glass of hard liquor (44 mL). General information Avoid eating more than 2,300 mg of salt a day. If you have hypertension, you may need to reduce your sodium intake to 1,500 mg a day. Work with your health care provider to maintain a healthy body weight or to lose weight. Ask what an ideal weight is for you. Get at least 30 minutes of exercise that causes your heart to beat faster (aerobic exercise) most days of the week. Activities may include walking, swimming, or biking. Work with your health care provider or dietitian to adjust your eating plan to your individual calorie needs. What foods should I eat? Fruits All fresh, dried, or frozen fruit. Canned fruit in natural juice (without added sugar). Vegetables Fresh or frozen vegetables (raw, steamed, roasted, or grilled). Low-sodium or reduced-sodium tomato and vegetable juice. Low-sodium or reduced-sodium tomato sauce and tomato paste. Low-sodium or reduced-sodium canned vegetables. Grains Whole-grain or whole-wheat bread. Whole-grain or whole-wheat pasta. Brown rice. [place]. Bulgur. Whole-grain and low-sodium cereals. Pita bread. Low-fat, low-sodium crackers. Whole-wheat flour tortillas. Meats  and other proteins Skinless chicken or [place]. Ground chicken or [place]. Pork with fat trimmed off. Fish and seafood. Egg whites. Dried beans, peas, or lentils. Unsalted nuts, nut butters, and seeds. Unsalted canned beans. Lean cuts of beef with fat trimmed off. Low-sodium, lean precooked or cured meat, such as sausages or meat loaves. Dairy Low-fat (1%) or fat-free (skim) milk. Reduced-fat, low-fat, or fat-free cheeses. Nonfat, low-sodium ricotta or cottage cheese. Low-fat or nonfat yogurt. Low-fat, low-sodium cheese. Fats and oils Soft margarine without trans fats. Vegetable oil. Reduced-fat, low-fat, or light mayonnaise and salad dressings (reduced-sodium). Canola, safflower, olive, avocado, soybean, and sunflower oils. Avocado. Seasonings and condiments Herbs. Spices. Seasoning mixes without salt. Other foods Unsalted popcorn and pretzels. Fat-free sweets. The items listed above may not be a complete list of foods and beverages you can eat. Contact a dietitian for more information. What foods should I avoid? Fruits Canned fruit in a light or heavy syrup. Fried fruit. Fruit in cream or butter sauce. Vegetables Creamed or fried vegetables. Vegetables in a cheese sauce. Regular canned vegetables (not low-sodium or reduced-sodium).  Regular canned tomato sauce and paste (not low-sodium or reduced-sodium). Regular tomato and vegetable juice (not low-sodium or reduced-sodium). Angie Fava. Olives. Grains Baked goods made with fat, such as croissants, muffins, or some breads. Dry pasta or rice meal packs. Meats and other proteins Fatty cuts of meat. Ribs. Fried meat. Berniece Salines. Bologna, salami, and other precooked or cured meats, such as sausages or meat loaves. Fat from the back of a pig (fatback). Bratwurst. Salted nuts and seeds. Canned beans with added salt. Canned or smoked fish. Whole eggs or egg yolks. Chicken or [place] with skin. Dairy Whole or 2% milk, cream, and half-and-half. Whole or full-fat  cream cheese. Whole-fat or sweetened yogurt. Full-fat cheese. Nondairy creamers. Whipped toppings. Processed cheese and cheese spreads. Fats and oils Butter. Stick margarine. Lard. Shortening. Ghee. Bacon fat. Tropical oils, such as coconut, palm kernel, or palm oil. Seasonings and condiments Onion salt, garlic salt, seasoned salt, table salt, and sea salt. Worcestershire sauce. Tartar sauce. Barbecue sauce. Teriyaki sauce. Soy sauce, including reduced-sodium. Steak sauce. Canned and packaged gravies. Fish sauce. Oyster sauce. Cocktail sauce. Store-bought horseradish. Ketchup. Mustard. Meat flavorings and tenderizers. Bouillon cubes. Hot sauces. Pre-made or packaged marinades. Pre-made or packaged taco seasonings. Relishes. Regular salad dressings. Other foods Salted popcorn and pretzels. The items listed above may not be a complete list of foods and beverages you should avoid. Contact a dietitian for more information. Where to find more information National Heart, Lung, and Blood Institute: https://wilson-eaton.com/ American Heart Association: www.heart.org Academy of Nutrition and Dietetics: www.eatright.[place]: www.kidney.org Summary The DASH eating plan is a healthy eating plan that has been shown to reduce high blood pressure (hypertension). It may also reduce your risk for type 2 diabetes, heart disease, and stroke. When on the DASH eating plan, aim to eat more fresh fruits and vegetables, whole grains, lean proteins, low-fat dairy, and heart-healthy fats. With the DASH eating plan, you should limit salt (sodium) intake to 2,300 mg a day. If you have hypertension, you may need to reduce your sodium intake to 1,500 mg a day. Work with your health care provider or dietitian to adjust your eating plan to your individual calorie needs. This information is not intended to replace advice given to you by your health care provider. Make sure you discuss any questions you have with  your health care provider. Document Revised: 06/16/2019 Document Reviewed: 06/16/2019 Elsevier Patient Education  [place] refers to food and lifestyle choices that are based on the traditions of countries located on the [employer]. It focuses on eating more fruits, vegetables, whole grains, beans, nuts, seeds, and heart-healthy fats, and eating less dairy, meat, eggs, and processed foods with added sugar, salt, and fat. This way of eating has been shown to help prevent certain conditions and improve outcomes for people who have chronic diseases, like kidney disease and heart disease. What are tips for following this plan? Reading food labels Check the serving size of packaged foods. For foods such as rice and pasta, the serving size refers to the amount of cooked product, not dry. Check the total fat in packaged foods. Avoid foods that have saturated fat or trans fats. Check the ingredient list for added sugars, such as corn syrup. Shopping  Buy a variety of foods that offer a balanced diet, including: Fresh fruits and vegetables (produce). Grains, beans, nuts, and seeds. Some of these may be available in unpackaged forms or large amounts (in bulk). Fresh seafood.  Poultry and eggs. Low-fat dairy products. Buy whole ingredients instead of prepackaged foods. Buy fresh fruits and vegetables in-season from local farmers markets. Buy plain frozen fruits and vegetables. If you do not have access to quality fresh seafood, buy precooked frozen shrimp or canned fish, such as tuna, salmon, or sardines. Stock your pantry so you always have certain foods on hand, such as olive oil, canned tuna, canned tomatoes, rice, pasta, and beans. Cooking Cook foods with extra-virgin olive oil instead of using butter or other vegetable oils. Have meat as a side dish, and have vegetables or grains as your main dish. This means having meat in small portions or  adding small amounts of meat to foods like pasta or stew. Use beans or vegetables instead of meat in common dishes like chili or lasagna. Experiment with different cooking methods. Try roasting, broiling, steaming, and sauting vegetables. Add frozen vegetables to soups, stews, pasta, or rice. Add nuts or seeds for added healthy fats and plant protein at each meal. You can add these to yogurt, salads, or vegetable dishes. Marinate fish or vegetables using olive oil, lemon juice, garlic, and fresh herbs. Meal planning Plan to eat one vegetarian meal one day each week. Try to work up to two vegetarian meals, if possible. Eat seafood two or more times a week. Have healthy snacks readily available, such as: Vegetable sticks with hummus. Greek yogurt. Fruit and nut trail mix. Eat balanced meals throughout the week. This includes: Fruit: 2-3 servings a day. Vegetables: 4-5 servings a day. Low-fat dairy: 2 servings a day. Fish, poultry, or lean meat: 1 serving a day. Beans and legumes: 2 or more servings a week. Nuts and seeds: 1-2 servings a day. Whole grains: 6-8 servings a day. Extra-virgin olive oil: 3-4 servings a day. Limit red meat and sweets to only a few servings a month. Lifestyle  Cook and eat meals together with your family, when possible. Drink enough fluid to keep your urine pale yellow. Be physically active every day. This includes: Aerobic exercise like running or swimming. Leisure activities like gardening, walking, or housework. Get 7-8 hours of sleep each night. If recommended by your health care provider, drink red wine in moderation. This means 1 glass a day for nonpregnant women and 2 glasses a day for men. A glass of wine equals 5 oz (150 mL). What foods should I eat? Fruits Apples. Apricots. Avocado. Berries. Bananas. Cherries. Dates. Figs. Grapes. Lemons. Melon. Oranges. Peaches. Plums. Pomegranate. Vegetables Artichokes. Beets. Broccoli. Cabbage. Carrots.  Eggplant. Green beans. Chard. Kale. Spinach. Onions. Leeks. Peas. Squash. Tomatoes. Peppers. Radishes. Grains Whole-grain pasta. Brown rice. Bulgur wheat. Polenta. Couscous. Whole-wheat bread. [place]. Meats and other proteins Beans. Almonds. Sunflower seeds. Pine nuts. Peanuts. [place]. Salmon. Scallops. Shrimp. [place]. Tilapia. Clams. Oysters. Eggs. Poultry without skin. Dairy Low-fat milk. Cheese. Greek yogurt. Fats and oils Extra-virgin olive oil. Avocado oil. Grapeseed oil. Beverages Water. Red wine. Herbal tea. Sweets and desserts Greek yogurt with honey. Baked apples. Poached pears. Trail mix. Seasonings and condiments Basil. Cilantro. Coriander. Cumin. Mint. Parsley. Sage. Rosemary. Tarragon. Garlic. Oregano. Thyme. Pepper. Balsamic vinegar. Tahini. Hummus. Tomato sauce. Olives. Mushrooms. The items listed above may not be a complete list of foods and beverages you can eat. Contact a dietitian for more information. What foods should I limit? This is a list of foods that should be eaten rarely or only on special occasions. Fruits Fruit canned in syrup. Vegetables Deep-fried potatoes (french fries). Grains Prepackaged pasta or rice dishes.  Prepackaged cereal with added sugar. Prepackaged snacks with added sugar. Meats and other proteins Beef. Pork. Lamb. Poultry with skin. Hot dogs. Tomasa Blase. Dairy Ice cream. Sour cream. Whole milk. Fats and oils Butter. Canola oil. Vegetable oil. Beef fat (tallow). Lard. Beverages Juice. Sugar-sweetened soft drinks. Beer. Liquor and spirits. Sweets and desserts Cookies. Cakes. Pies. Candy. Seasonings and condiments Mayonnaise. Pre-made sauces and marinades. The items listed above may not be a complete list of foods and beverages you should limit. Contact a dietitian for more information. Summary The Mediterranean diet includes both food and lifestyle choices. Eat a variety of fresh fruits and vegetables, beans, nuts, seeds, and whole  grains. Limit the amount of red meat and sweets that you eat. If recommended by your health care provider, drink red wine in moderation. This means 1 glass a day for nonpregnant women and 2 glasses a day for men. A glass of wine equals 5 oz (150 mL). This information is not intended to replace advice given to you by your health care provider. Make sure you discuss any questions you have with your health care provider. Document Revised: 08/18/2019 Document Reviewed: 06/15/2019 Elsevier Patient Education  2023 [employer].  The quicker you return assistance paperwork to this office at the front desk the sooner we can help you with free medications.   Important Information About Sugar

## 2022-06-16 NOTE — Telephone Encounter (Signed)
H&V Care Navigation CSW Progress Note  Clinical Social Worker contacted patient by phone to f/u on referral from [facility] office after pt appt with Marcelino Duster. Concerns related to medication assistance and lack of PCP/insurance. LCSW was unable to reach pt this afternoon at [phone], left voicemail requesting call back. Will f/u again as able.   Patient is participating in a Managed Medicaid Plan:  No, self pay at this time. Had [place] Medicaid and per notes will perhaps have commercial insurance starting 12/1.   SDOH Screenings   Tobacco Use: Medium Risk (06/16/2022)    Riley Jefferson, MSW, LCSW Clinical Social Worker II [facility] Heart/Vascular Care Navigation  [phone]- work cell phone (preferred) [phone]- desk phone

## 2022-06-17 ENCOUNTER — Telehealth: Payer: Self-pay | Admitting: Licensed Clinical Social Worker

## 2022-06-17 NOTE — Telephone Encounter (Signed)
H&V Care Navigation CSW Progress Note  Clinical Social Worker contacted patient again by phone to f/u on referral from [facility] office. Concerns related to medication assistance and lack of PCP/insurance. LCSW was unable to reach pt this morning again at [phone], left 2nd voicemail requesting call back.    Patient is participating in a Managed Medicaid Plan:  No, self pay at this time. Had [place] Medicaid and per notes will perhaps have commercial insurance starting 12/1.     SDOH Screenings   Tobacco Use: Medium Risk (06/16/2022)   Octavio Graves, MSW, LCSW Clinical Social Worker II [facility] Heart/Vascular Care Navigation  [phone]- work cell phone (preferred) [phone]- desk phone

## 2022-06-30 ENCOUNTER — Ambulatory Visit: Payer: 59 | Attending: Nurse Practitioner

## 2022-06-30 DIAGNOSIS — I10 Essential (primary) hypertension: Secondary | ICD-10-CM

## 2022-06-30 DIAGNOSIS — I4891 Unspecified atrial fibrillation: Secondary | ICD-10-CM

## 2022-06-30 DIAGNOSIS — R0609 Other forms of dyspnea: Secondary | ICD-10-CM

## 2022-06-30 DIAGNOSIS — I5023 Acute on chronic systolic (congestive) heart failure: Secondary | ICD-10-CM

## 2022-06-30 LAB — BASIC METABOLIC PANEL
BUN/Creatinine Ratio: 15 (ref 10–24)
BUN: 13 mg/dL (ref 8–27)
CO2: 21 mmol/L (ref 20–29)
Calcium: 9.1 mg/dL (ref 8.6–10.2)
Chloride: 106 mmol/L (ref 96–106)
Creatinine, Ser: 0.86 mg/dL (ref 0.76–1.27)
Glucose: 81 mg/dL (ref 70–99)
Potassium: 4.3 mmol/L (ref 3.5–5.2)
Sodium: 139 mmol/L (ref 134–144)
eGFR: 97 mL/min/{1.73_m2} (ref >59)

## 2022-07-01 ENCOUNTER — Telehealth: Payer: Self-pay | Admitting: Licensed Clinical Social Worker

## 2022-07-01 NOTE — Telephone Encounter (Signed)
H&V Care Navigation CSW Progress Note  Clinical Social Worker contacted patient by phone to f/u on referral from [facility] office after pt appt with Marcelino Duster. Concerns related to medication assistance and lack of PCP/insurance. LCSW was able to reach pt this afternoon at [phone]- introduced self, role, reason for call. Pt shares that he can't really talk b/c he's laying down due to SOB and continues to state that his "heart hurt Sunday but its better now". I encouraged pt to see if someone can take him for further evaluation, he states his brother may be able to. If his brother cannot I encouraged him to call 911 if pain and SOB continues. I will also route this to [facility] triage for further f/u.   SDOH Screenings   Tobacco Use: Medium Risk (06/16/2022)    Octavio Graves, MSW, LCSW Clinical Social Worker II [facility] Heart/Vascular Care Navigation  [phone]- work cell phone (preferred) [phone]- desk phone

## 2022-07-01 NOTE — Telephone Encounter (Signed)
Spoke with pt who reports continued SOB on exertion.  Pt denies current CP.  Reviewed medications with pt.  Pt has not picked up Furosemide or Entresto.  Encouraged pt to pick up medications tonight and take as directed as both of these medications should help with his SOB.  Reviewed ED precautions.  Pt is scheduled to see M. Swinyer,NP again on 07/15/2022.  Pt verbalizes understanding and agrees with current plan.

## 2022-07-12 NOTE — Progress Notes (Deleted)
Cardiology Office Note:    Date:  07/12/2022   ID:  Riley Jefferson Pages, DOB 1960/08/18, MRN [id number]  PCP:  System, Provider Not In   [place] Prov[facility] Cardiologist:  [name], MD     Referring MD: No ref. provider found   Chief Complaint: hospital follow-up  History of Present Illness:    Riley Jefferson is a pleasant 61 y.o. male with a hx of chronic atrial fibrillation on chronic anticoagulation, HFrEF with EF 40-45% in 04/2019, HTN, and prior tobacco use  Admission 11/13-11/15/23 seen by cardiology for evaluation of pain and shortness of breath.  Has been followed by cardiology in [place]. Atrial fibrillation dating back to 2004, was previously on Coumadin but unable to tolerate due to significant bleeding (hemoptysis, hematuria, epistaxis, and bleeding from eyes). Not able to afford DOACs. Per review of notes in Care Everywhere, long history of dyspnea on exertion.  Prior Myoview 09/2015 showed no evidence of ischemia, EF 31%.  Echo at that time showed LVEF 40 to 45% and severe left atrial enlargement.  Admitted at a hospital in [place] in 04/2022 for sudden onset of shortness of breath and then developed chest pain in the ED.  PET stress 04/2019 showed no evidence of ischemia or infarction.  Echo at that time showed LVEF 40 to 45% with reduced global longitudinal strain of -7% and mild LVH. He moved to [facility] in 06/2021 and has not yet established with PCP or cardiologist.  He presented to ED with sudden onset of shortness of breath in the setting of being very upset with someone at work.  Reports intermittent shortness of breath and chest pain also outside of panic attacks.  Symptoms usually last for couple of minutes and then resolve on their own.  Sometimes symptoms worsen with the and improved when going inside.  Usually feels his heart racing.  Has been told he snores and stops breathing at night and will occasionally wake up gasping for air. Occasional  lightheadedness/dizziness especially if he is outside in the heat, no syncope.  EKG showed atrial fibrillation at 68 bpm with LVH and mild ST depression and T wave inversions in V5 and 6.  High-sensitivity troponin negative x2, BNP normal.  CXR with no acute findings.  K 2.1. Telemetry revealed occasional pauses 2.22 sec with HR in the 40s around those times but very brief. Potassium normalized with repletion.  He was placed on Eliquis.  Shortness of breath and chest discomfort improved, felt to be atypical for angina.  He was placed on Lasix 20 mg twice daily for LVEF 40-45%. He was discharged on 06/10/22.  Seen by me on 06/16/22 for follow-up. Reported shortness of breathing. Woke up this morning breathing hard. When he sits and slows his breathing down, breathing improves. Has some mid sternal chest pressure that occurs randomly, thinks it may occur more often when he is having problems getting his breath.  Is very concerned about his heart and wants to do everything we ask him to do. 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 At times, he gets anxious because he does not feel like he can breath good while at work. No diaphoresis or n/v. He denied lower extremity edema, fatigue, palpitations, melena, hematuria, hemoptysis, diaphoresis, weakness, presyncope, syncope, orthopnea, and PND.  We have asked for social work to be involved in patient's care.   Contacted by  LCSW on 12/6 to offer assistance with cost of medical therapy. Reported SOB. Was then contacted by RN and reported SOB on exertion. He had not started his [place] or [place]. Was advised to get these medications and contact us if symptoms persist.  ER precautions were also reviewed.  Today, he is here   Past Medical History:  Diagnosis Date   Atrial fibrillation ([place])    Hypertension     No past surgical history on file.  Current Medications: No  outpatient medications have been marked as taking for the 07/15/22 encounter (Appointment) with Ann Maki, Lanice Schwab, NP.     Allergies:   Patient has no known allergies.   Social History   Socioeconomic History   Marital status: Single    Spouse name: Not on file   Number of children: Not on file   Years of education: Not on file   Highest education level: Not on file  Occupational History   Not on file  Tobacco Use   Smoking status: Former    Packs/day: 1.00    Years: 20.00    Total pack years: 20.00    Types: Cigarettes    Quit date: 09/21/2001    Years since quitting: 20.8   Smokeless tobacco: Never  Vaping Use   Vaping Use: Never used  Substance and Sexual Activity   Alcohol use: Never   Drug use: Never   Sexual activity: Not on file  Other Topics Concern   Not on file  Social History Narrative   Not on file   Social Determinants of Health   Financial Resource Strain: Not on file  Food Insecurity: Not on file  Transportation Needs: Not on file  Physical Activity: Not on file  Stress: Not on file  Social Connections: Not on file     Family History: The patient's family history includes Stroke in his father.  ROS:   Please see the history of present illness.    + DOE + occasional chest pressure All other systems reviewed and are negative.  Labs/Other Studies Reviewed:    The following studies were reviewed today:  Echo 06/10/2022 1. Left ventricular ejection fraction, by estimation, is 40 to 45%. The  left ventricle has mildly decreased function. The left ventricle  demonstrates global hypokinesis. There is moderate left ventricular  hypertrophy. Left ventricular diastolic  parameters are indeterminate.   2. Right ventricular systolic function is normal. The right ventricular  size is normal. There is normal pulmonary artery systolic pressure. The  estimated right ventricular systolic pressure is 123XX123 mmHg.   3. Left atrial size was moderately  dilated.   4. The mitral valve is normal in structure. No evidence of mitral valve  regurgitation. No evidence of mitral stenosis.   5. The aortic valve is tricuspid. Aortic valve regurgitation is not  visualized. No aortic stenosis is present.   6. The inferior vena cava is normal in size with greater than 50%  respiratory variability, suggesting right atrial pressure of 3 mmHg.    PET Stress Test 05/08/2019 [facility]): Impressions: 1. No evidence of ischemia or infarction  2. Normal left wall motion  3. Abnormal decreased ejection fraction  4. Normal coronary flow reserve  5. TID is normal  6. No significant coronary artery calcification noted on CT obtained for attenuation correction  7. This is a low risk scan  8. Small pulmonary nodule seen at the left lower lobe    Echocardiogram 05/08/2019 [facility]): Impressions:  1. Overall  left ventricular ejection fraction is estimated at 40 to 45%.   2. Mildly to moderately decreased global left ventricular systolic function.   3. Mild septal left ventricular hypertrophy.   4. Mild to moderately increased left ventricular internal cavity size.   5. Mildly dilated left atrium.   6. Mild mitral annular calcification.   7. GLS -7% (reduced).   Recent Labs: 06/08/2022: B Natriuretic Peptide 45.8 06/09/2022: TSH 1.019 06/10/2022: ALT 23; Hemoglobin 15.8; Magnesium 2.1; Platelets 180 06/30/2022: BUN 13; Creatinine, Ser 0.86; Potassium 4.3; Sodium 139  Recent Lipid Panel No results found for: "CHOL", "TRIG", "HDL", "CHOLHDL", "VLDL", "LDLCALC", "LDLDIRECT"   Risk Assessment/Calculations:    CHA2DS2-VASc Score = 2  {This indicates a 2.2% annual risk of stroke. The patient's score is based upon: CHF History: 1 HTN History: 1 Diabetes History: 0 Stroke History: 0 Vascular Disease History: 0 Age Score: 0 Gender Score: 0    Physical Exam:    VS:  There were no vitals taken for this visit.    Wt Readings from Last  3 Encounters:  06/16/22 (!) 304 lb (137.9 kg)  06/09/22 (!) 303 lb 8 oz (137.7 kg)  03/23/22 290 lb (131.5 kg)     GEN: Obese in no acute distress HEENT: Normal NECK: No JVD; No carotid bruits CARDIAC: RRR, no murmurs, rubs, gallops RESPIRATORY:  Clear to auscultation without rales, wheezing or rhonchi  ABDOMEN: Soft, non-tender, non-distended MUSCULOSKELETAL:  No edema; No deformity. 2+ pedal pulses, equal bilaterally SKIN: Warm and dry NEUROLOGIC:  Alert and oriented x 3 PSYCHIATRIC:  Normal affect   EKG:  EKG is not ordered today.    No BP recorded.  {Refresh Note OR Click here to enter BP  :1}***  Diagnoses:    No diagnosis found.  Assessment and Plan:     DOE/Chest pressure: Chest pressure that occurs most frequently when he anxious or having difficulty breathing. As noted below, recent diagnosis of HFrEF. PET stress test 04/2019 reassuring. Will defer ischemia evaluation for now in hopes that breathing and chest pressure improve on GDMT for HFrEF. Consider R/LHC at next office visit.    HFrEF: LVEF 40 to 45%, global HK of LV, indeterminate diastolic parameters, normal RV, no significant valve disease on echo 06/10/22.  He is obese and it is difficult to assess for fluid overload.  He denies dyspnea, edema, orthopnea. Woke up gasping for air this morning. Breathing improved within a few minutes. Need to up-titrate GDMT. Will start Entresto and try to get patient assistance for him. He will return in 2 weeks for bmet.  Low-sodium heart healthy diet and weight loss encouraged.  Atrial fibrillation: HR is well-controlled. Does not describe specific symptoms of a fib. No bleeding concerns on Eliquis.  Eliquis samples given and patient assistance forms reviewed with him. He says he has a daughter who can help him complete paperwork. Continue Eliquis, metoprolol.   Sleep apnea: Stop bang score is 8.  Does not currently have insurance. We will consider outpatient sleep study once he  is insured.     Disposition: ***  Medication Adjustments/Labs and Tests Ordered: Current medicines are reviewed at length with the patient today.  Concerns regarding medicines are outlined above.  No orders of the defined types were placed in this encounter.  No orders of the defined types were placed in this encounter.   There are no Patient Instructions on file for this visit.   Signed, Levi Aland, NP  07/12/2022 3:18 PM  [place]

## 2022-07-15 ENCOUNTER — Ambulatory Visit: Payer: 59 | Admitting: Nurse Practitioner

## 2022-07-15 NOTE — Progress Notes (Signed)
Cardiology Office Note:    Date:  07/17/2022   ID:  Riley Jefferson, DOB 10/08/1960, MRN [id number]  PCP:  System, Provider Not In   [place] Providers Cardiologist:  [name], MD     Referring MD: No ref. provider found   Chief Complaint: hospital follow-up  History of Present Illness:    Riley Jefferson is a pleasant 61 y.o. male with a hx of chronic atrial fibrillation on chronic anticoagulation, HFrEF with EF 40-45% in 04/2019, HTN, and prior tobacco use  Admission 11/13-11/15/23 seen by cardiology for evaluation of pain and shortness of breath.  Has been followed by cardiology in [place]. Atrial fibrillation dating back to 2004, was previously on Coumadin but unable to tolerate due to significant bleeding (hemoptysis, hematuria, epistaxis, and bleeding from eyes). Not able to afford DOACs. Per review of notes in Care Everywhere, long history of dyspnea on exertion.  Prior Myoview 09/2015 showed no evidence of ischemia, EF 31%.  Echo at that time showed LVEF 40 to 45% and severe left atrial enlargement.  Admitted at a hospital in [place] in 04/2022 for sudden onset of shortness of breath and then developed chest pain in the ED.  PET stress 04/2019 showed no evidence of ischemia or infarction.  Echo at that time showed LVEF 40 to 45% with reduced global longitudinal strain of -7% and mild LVH. He moved to [facility] in 06/2021 and has not yet established with PCP or cardiologist.  He presented to ED with sudden onset of shortness of breath in the setting of being very upset with someone at work.  Reports intermittent shortness of breath and chest pain also outside of panic attacks.  Symptoms usually last for couple of minutes and then resolve on their own.  Sometimes symptoms worsen with the and improved when going inside.  Usually feels his heart racing.  Has been told he snores and stops breathing at night and will occasionally wake up gasping for air. Occasional  lightheadedness/dizziness especially if he is outside in the heat, no syncope.  EKG showed atrial fibrillation at 68 bpm with LVH and mild ST depression and T wave inversions in V5 and 6.  High-sensitivity troponin negative x2, BNP normal.  CXR with no acute findings.  K 2.1.Telemetry revealed occasional pauses 2.22 sec with HR in the 40s around those times but very brief. Potassium normalized with repletion.  He was placed on Eliquis.  Shortness of breath and chest discomfort improved, felt to be atypical for angina.  He was placed on Lasix 20 mg twice daily for LVEF 40-45%. He was discharged on 06/10/22.  Seen by me on 06/16/22 for follow-up. Reported shortness of breathing. Woke up this morning breathing hard. When he sits and slows his breathing down, breathing improves. Has some mid sternal chest pressure that occurs randomly, thinks it may occur more often when he is having problems getting his breath.  Is very concerned about his heart and wants to do everything we ask him to do. 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 At times, he gets anxious because he does not feel like he can breath good while at work. No diaphoresis or n/v. He denied lower extremity edema, fatigue, palpitations, melena, hematuria, hemoptysis, diaphoresis, weakness, presyncope, syncope, orthopnea, and PND.  We have asked for social work to be involved in patient's care.  Contacted by LCSW on  12/6 to offer assistance with cost of medical therapy. Reported SOB. Was then contacted by RN and reported SOB on exertion. He had not started his [place] or [place]. Was advised to get these medications and contact us if symptoms persist. ER precautions were also reviewed.  Today, he is here alone for follow-up. Reports he is "maintaining." Thinks his problems started when he was in the [employer]. Has PTSD. Job causes him stress, has stress with his family.  Seems disoriented. Is referencing troubles that he has but no specific cardiac symptoms. Offered hospital admission, he wants to go home. He reports problems with his breathing when he gets stressed. It was difficult for him to get here on time today because he does not sleep well. It is difficult to discern if he is having any orthopnea or PND. Denies chest pain, palpitations, edema, presyncope, syncope.   Past Medical History:  Diagnosis Date   Atrial fibrillation ([place])    Hypertension     History reviewed. No pertinent surgical history.  Current Medications: Current Meds  Medication Sig   acetaminophen (TYLENOL) 325 MG tablet Take 2 tablets (650 mg total) by mouth every 6 (six) hours as needed for mild pain (or Fever >/= 101).   apixaban (ELIQUIS) 5 MG TABS tablet Take 1 tablet (5 mg total) by mouth 2 (two) times daily.   furosemide (LASIX) 20 MG tablet Take 1 tablet (20 mg total) by mouth 2 (two) times daily.   metoprolol succinate (TOPROL-XL) 25 MG 24 hr tablet Take 0.5 tablets (12.5 mg total) by mouth daily.   sacubitril-valsartan (ENTRESTO) 49-51 MG Take 1 tablet by mouth 2 (two) times daily.     Allergies:   Patient has no known allergies.   Social History   Socioeconomic History   Marital status: Single    Spouse name: Not on file   Number of children: Not on file   Years of education: Not on file   Highest education level: Not on file  Occupational History   Not on file  Tobacco Use   Smoking status: Former    Packs/day: 1.00    Years: 20.00    Total pack years: 20.00    Types: Cigarettes    Quit date: 09/21/2001    Years since quitting: 20.8   Smokeless tobacco: Never  Vaping Use   Vaping Use: Never used  Substance and Sexual Activity   Alcohol use: Never   Drug use: Never   Sexual activity: Not on file  Other Topics Concern   Not on file  Social History Narrative   Not on file   Social Determinants of Health   Financial Resource Strain: Not on file   Food Insecurity: Not on file  Transportation Needs: Not on file  Physical Activity: Not on file  Stress: Not on file  Social Connections: Not on file     Family History: The patient's family history includes Stroke in his father.  ROS:   Please see the history of present illness.   All other systems reviewed and are negative.  Labs/Other Studies Reviewed:    The following studies were reviewed today:  Echo 06/10/2022 1. Left ventricular ejection fraction, by estimation, is 40 to 45%. The  left ventricle has mildly decreased function. The left ventricle  demonstrates global hypokinesis. There is moderate left ventricular  hypertrophy. Left ventricular diastolic  parameters are indeterminate.   2. Right ventricular systolic function is normal. The right ventricular  size is normal. There is normal  pulmonary artery systolic pressure. The  estimated right ventricular systolic pressure is 123XX123 mmHg.   3. Left atrial size was moderately dilated.   4. The mitral valve is normal in structure. No evidence of mitral valve  regurgitation. No evidence of mitral stenosis.   5. The aortic valve is tricuspid. Aortic valve regurgitation is not  visualized. No aortic stenosis is present.   6. The inferior vena cava is normal in size with greater than 50%  respiratory variability, suggesting right atrial pressure of 3 mmHg.    PET Stress Test 05/08/2019 [facility]): Impressions: 1. No evidence of ischemia or infarction  2. Normal left wall motion  3. Abnormal decreased ejection fraction  4. Normal coronary flow reserve  5. TID is normal  6. No significant coronary artery calcification noted on CT obtained for attenuation correction  7. This is a low risk scan  8. Small pulmonary nodule seen at the left lower lobe    Echocardiogram 05/08/2019 [facility]): Impressions:  1. Overall left ventricular ejection fraction is estimated at 40 to 45%.   2. Mildly to moderately  decreased global left ventricular systolic function.   3. Mild septal left ventricular hypertrophy.   4. Mild to moderately increased left ventricular internal cavity size.   5. Mildly dilated left atrium.   6. Mild mitral annular calcification.   7. GLS -7% (reduced).   Recent Labs: 06/08/2022: B Natriuretic Peptide 45.8 06/09/2022: TSH 1.019 06/10/2022: ALT 23; Hemoglobin 15.8; Magnesium 2.1; Platelets 180 06/30/2022: BUN 13; Creatinine, Ser 0.86; Potassium 4.3; Sodium 139  Recent Lipid Panel No results found for: "CHOL", "TRIG", "HDL", "CHOLHDL", "VLDL", "LDLCALC", "LDLDIRECT"   Risk Assessment/Calculations:    CHA2DS2-VASc Score = 2  {This indicates a 2.2% annual risk of stroke. The patient's score is based upon: CHF History: 1 HTN History: 1 Diabetes History: 0 Stroke History: 0 Vascular Disease History: 0 Age Score: 0 Gender Score: 0    Physical Exam:    VS:  BP (!) 150/88   Pulse 68   Ht 6\' 4"  (1.93 m)   Wt 300 lb (136.1 kg)   SpO2 97%   BMI 36.52 kg/m     Wt Readings from Last 3 Encounters:  07/16/22 300 lb (136.1 kg)  06/16/22 (!) 304 lb (137.9 kg)  06/09/22 (!) 303 lb 8 oz (137.7 kg)     GEN: Obese in no acute distress HEENT: Normal NECK: No JVD; No carotid bruits CARDIAC: RRR, no murmurs, rubs, gallops RESPIRATORY:  Clear to auscultation without rales, wheezing or rhonchi  ABDOMEN: Soft, non-tender, non-distended MUSCULOSKELETAL:  No edema; No deformity. 2+ pedal pulses, equal bilaterally SKIN: Warm and dry NEUROLOGIC:  Alert and oriented to location, self.  PSYCHIATRIC: Inappropriate affect. Mood altered. Not able to provide clear answers to questions.   EKG:  EKG is not ordered today.    HYPERTENSION CONTROL Vitals:   07/16/22 1517 07/16/22 1554  BP: (!) 144/92 (!) 150/88    The patient's blood pressure is elevated above target today.  In order to address the patient's elevated BP: A new medication was prescribed today.     Diagnoses:     1. DOE (dyspnea on exertion)   2. Chest pain on breathing   3. Atrial fibrillation with slow ventricular response ([place])   4. Chronic HFrEF (heart failure with reduced ejection fraction) ([place])   5. Chronic anticoagulation   6. Primary hypertension     Assessment and Plan:     DOE/Chest pressure: "Difficulty  breathing" that occurs most frequently when he anxious or stressed. PET stress test 04/2019 reassuring. Will defer ischemia evaluation for now in hopes that breathing and chest pressure improve on GDMT for HFrEF. Has not started Entresto. We have called the pharmacy and authorized the medication so that he can pick it up. Strongly encouraged him to start Entresto.    HFrEF: LVEF 40 to 45%, global HK of LV, indeterminate diastolic parameters, normal RV, no significant valve disease on echo 06/10/22. Weight is down today. Body habitus makes it difficult to assess for fluid overload. He does not monitor weight or fluid status reliably. Reports he is not sleeping well, difficult to assess if he is having orthopnea and PND. No edema. No increased WOB in clinic. Advised to start Entresto. Will see him soon to check BMP.  Hypertension: BP is elevated and remains so on my recheck.  Needs to start [facility] which will likely improve BP.  Atrial fibrillation on chronic anticoagulation: HR is well-controlled. Denies problems with Eliquis. Continue Eliquis, metoprolol.   Sleep apnea: Stop bang score is 8.  Consider home sleep study when he is willing.     Disposition: 1 month with Dr. Lynnette Caffey  Medication Adjustments/Labs and Tests Ordered: Current medicines are reviewed at length with the patient today.  Concerns regarding medicines are outlined above.  No orders of the defined types were placed in this encounter.  No orders of the defined types were placed in this encounter.   Patient Instructions  Medication Instructions:   Pick up Entresto one (1) tablet by mouth (49-51 mg) twice  daily, TOMORROW.   *If you need a refill on your cardiac medications before your next appointment, please call your pharmacy*   Lab Work:  None ordered.  If you have labs (blood work) drawn today and your tests are completely normal, you will receive your results only by: MyChart Message (if you have MyChart) OR A paper copy in the mail If you have any lab test that is abnormal or we need to change your treatment, we will call you to review the results.   Testing/Procedures:  None ordered.   Follow-Up: At [facility], you and your health needs are our priority.  As part of our continuing mission to provide you with exceptional heart care, we have created designated Provider Care Teams.  These Care Teams include your primary Cardiologist (physician) and Advanced Practice Providers (APPs -  Physician Assistants and Nurse Practitioners) who all work together to provide you with the care you need, when you need it.  We recommend signing up for the patient portal called "MyChart".  Sign up information is provided on this After Visit Summary.  MyChart is used to connect with patients for Virtual Visits (Telemedicine).  Patients are able to view lab/test results, encounter notes, upcoming appointments, etc.  Non-urgent messages can be sent to your provider as well.   To learn more about what you can do with MyChart, go to [email or web address].    Your next appointment:   1 month(s)  The format for your next appointment:   In Person  Provider:   Orbie Pyo, MD      Important Information About Sugar         Signed, Levi Aland, NP  07/17/2022 6:16 AM    [facility]

## 2022-07-16 ENCOUNTER — Ambulatory Visit: Payer: 59 | Attending: Nurse Practitioner | Admitting: Nurse Practitioner

## 2022-07-16 ENCOUNTER — Encounter: Payer: Self-pay | Admitting: Nurse Practitioner

## 2022-07-16 VITALS — BP 150/88 | HR 68 | Ht 76.0 in | Wt 300.0 lb

## 2022-07-16 DIAGNOSIS — R0609 Other forms of dyspnea: Secondary | ICD-10-CM

## 2022-07-16 DIAGNOSIS — R071 Chest pain on breathing: Secondary | ICD-10-CM

## 2022-07-16 DIAGNOSIS — I10 Essential (primary) hypertension: Secondary | ICD-10-CM

## 2022-07-16 DIAGNOSIS — I5022 Chronic systolic (congestive) heart failure: Secondary | ICD-10-CM

## 2022-07-16 DIAGNOSIS — Z7901 Long term (current) use of anticoagulants: Secondary | ICD-10-CM

## 2022-07-16 DIAGNOSIS — I4891 Unspecified atrial fibrillation: Secondary | ICD-10-CM | POA: Diagnosis not present

## 2022-07-16 NOTE — Patient Instructions (Signed)
Medication Instructions:   Pick up Entresto one (1) tablet by mouth (49-51 mg) twice daily, TOMORROW.   *If you need a refill on your cardiac medications before your next appointment, please call your pharmacy*   Lab Work:  None ordered.  If you have labs (blood work) drawn today and your tests are completely normal, you will receive your results only by: MyChart Message (if you have MyChart) OR A paper copy in the mail If you have any lab test that is abnormal or we need to change your treatment, we will call you to review the results.   Testing/Procedures:  None ordered.   Follow-Up: At [facility], you and your health needs are our priority.  As part of our continuing mission to provide you with exceptional heart care, we have created designated Provider Care Teams.  These Care Teams include your primary Cardiologist (physician) and Advanced Practice Providers (APPs -  Physician Assistants and Nurse Practitioners) who all work together to provide you with the care you need, when you need it.  We recommend signing up for the patient portal called "MyChart".  Sign up information is provided on this After Visit Summary.  MyChart is used to connect with patients for Virtual Visits (Telemedicine).  Patients are able to view lab/test results, encounter notes, upcoming appointments, etc.  Non-urgent messages can be sent to your provider as well.   To learn more about what you can do with MyChart, go to [email or web address].    Your next appointment:   1 month(s)  The format for your next appointment:   In Person  Provider:   Orbie Pyo, MD      Important Information About Sugar

## 2022-07-17 ENCOUNTER — Telehealth: Payer: Self-pay

## 2022-07-17 ENCOUNTER — Encounter: Payer: Self-pay | Admitting: Nurse Practitioner

## 2022-07-17 NOTE — Telephone Encounter (Signed)
**Note De-Identified Barri Neidlinger Obfuscation** Sherri Rad (Key: [facility]) Rx #: [id number] Outcome Available without authorization. Drug Entresto 49-51MG  tablets Form [employer] Healthy [employer] Electronic [place] Form [phone] NCPDP) Original Claim Info 56  I have notified [facility] [address] [place], [place] - [address] (Ph: [phone]) of this outcome.

## 2022-08-12 ENCOUNTER — Encounter: Payer: Self-pay | Admitting: Internal Medicine

## 2022-08-12 ENCOUNTER — Telehealth: Payer: Self-pay | Admitting: Nurse Practitioner

## 2022-08-12 NOTE — Telephone Encounter (Signed)
The patient has an appointment with Dr. Ali Lowe tomorrow afternoon.  Can provide work note at that time if okay w Dr. Ali Lowe.

## 2022-08-12 NOTE — Telephone Encounter (Signed)
Pt called in to report SOB.  Pt reports is taking furosemide 20 mg PO BID as ordered.  Reports is taking 4 medications in total.  Does not check daily weights.  Last known wt is 300 lbs.  Only has BP checked at [place] last visit per pt 07/15/22 thinks was around 146/90. Reports is having funny heart beats that are sometimes sharp in the center of heart.  Has to sit down d/t pain.   Pt OV with Dr. Ali Lowe moved from 08/25/22 to 08/13/22 at 2:40 pm. Advised pt if SOB worsens to call 911 or seek urgent care.  Pt thanked me for the call.

## 2022-08-12 NOTE — Telephone Encounter (Signed)
Error

## 2022-08-12 NOTE — Telephone Encounter (Signed)
Pt c/o Shortness Of Breath: STAT if SOB developed within the last 24 hours or pt is noticeably SOB on the phone  1. Are you currently SOB (can you hear that pt is SOB on the phone)? Yes   2. How long have you been experiencing SOB? "For a while"   3. Are you SOB when sitting or when up moving around? Both   4. Are you currently experiencing any other symptoms? "I get weak and sometimes my vision gets blurry"   Pt states he has been having some SOB for a while now.     Pt also states he has been out of work since 05/18/23 and he needs a note from [place], NP.

## 2022-08-12 NOTE — Telephone Encounter (Signed)
Patient called stating he needs a letter excusing him from work. Please advise when ready for pick up.

## 2022-08-12 NOTE — Progress Notes (Signed)
Cardiology Office Note:    Date:  08/13/2022   ID:  Riley Jefferson, DOB 09/21/1960, MRN [id number]  PCP:  System, Provider Not In   [facility] Providers Cardiologist:  Alverda Skeans, MD Referring MD: No ref. provider found   Chief Complaint/Reason for Referral:  Cardiology follow up  ASSESSMENT:    1. Chronic combined systolic and diastolic heart failure (HCC)   2. Paroxysmal atrial fibrillation (HCC)   3. BMI 36.0-36.9,adult   4. Snoring     PLAN:    In order of problems listed above:  Chronic combined systolic and diastolic heart failure: Continue Toprol-XL 25 mg, Entresto, and start Jardiance 10 mg daily.  Will increase Lasix to 60 mg twice daily for 3 days then go down to a chronic dose of 40 mg twice daily.  I will see the patient next week with a BMP. PAF: Continue Toprol and apixaban for now. Elevated BMI:  Refer to pharmacy for recommendations. Snoring:  Will obtain OSA evaluation.             Dispo:  Return in about 1 week (around 08/20/2022).      Medication Adjustments/Labs and Tests Ordered: Current medicines are reviewed at length with the patient today.  Concerns regarding medicines are outlined above.  The following changes have been made:     Labs/tests ordered: Orders Placed This Encounter  Procedures   Basic metabolic panel   AMB Referral to [facility] Pharm-D   EKG 12-Lead   [place] Sleep Study    Medication Changes: Meds ordered this encounter  Medications   empagliflozin (JARDIANCE) 10 MG TABS tablet    Sig: Take 1 tablet (10 mg total) by mouth daily before breakfast.    Dispense:  90 tablet    Refill:  3   furosemide (LASIX) 40 MG tablet    Sig: Furosemide 60 mg twice daily for 3 days then 40 mg twice daily    Dispense:  180 tablet    Refill:  3     Current medicines are reviewed at length with the patient today.  The patient does not have concerns regarding medicines.   History of Present Illness:    FOCUSED PROBLEM LIST:    Atrial fibrillation on Eliquis; CV2 score 2 Nonischemic cardiomyopathy EF 40-45%; PET stress 2020 negative BMI 36  The patient is a 62 y.o. male with the indicated medical history here for cardiology follow up.  The patient had been admitted with acute on chronic combined diastolic and systolic heart failure in November.  His troponins and BNP were normal.  He was started on lasix 20mg  and discharged.  He was seen in November in the outpatient clinic with dyspnea.  He was not able to start December or [place] and LCSW was contacted.  He was seen in December and was noted to be disoriented and reported breathing difficulty when stressed.  Today he tells me that he has been more short of breath of late.  He tells me his weight has been going up over the last several weeks.  He reports dyspnea with exertion and orthopnea as well as some paroxysmal nocturnal dyspnea.  He denies any presyncope or syncope.  He has some chest heaviness at times but this is not routine.  He fortunately has not required any emergency room visits or hospitalizations.  He has been compliant with his medical therapy.  He does tell me that he snores and endorses daytime somnolence.  Current Medications: Current Meds  Medication Sig   acetaminophen (TYLENOL) 325 MG tablet Take 2 tablets (650 mg total) by mouth every 6 (six) hours as needed for mild pain (or Fever >/= 101).   apixaban (ELIQUIS) 5 MG TABS tablet Take 1 tablet (5 mg total) by mouth 2 (two) times daily.   empagliflozin (JARDIANCE) 10 MG TABS tablet Take 1 tablet (10 mg total) by mouth daily before breakfast.   furosemide (LASIX) 40 MG tablet Furosemide 60 mg twice daily for 3 days then 40 mg twice daily   metoprolol succinate (TOPROL-XL) 25 MG 24 hr tablet Take 0.5 tablets (12.5 mg total) by mouth daily.   sacubitril-valsartan (ENTRESTO) 49-51 MG Take 1 tablet by mouth 2 (two) times daily.   [DISCONTINUED] furosemide (LASIX) 20 MG tablet Take 1  tablet (20 mg total) by mouth 2 (two) times daily.     Allergies:    Patient has no known allergies.   Social History:   Social History   Tobacco Use   Smoking status: Former    Packs/day: 1.00    Years: 20.00    Total pack years: 20.00    Types: Cigarettes    Quit date: 09/21/2001    Years since quitting: 20.9   Smokeless tobacco: Never  Vaping Use   Vaping Use: Never used  Substance Use Topics   Alcohol use: Never   Drug use: Never     Family Hx: Family History  Problem Relation Age of Onset   Stroke Father      Review of Systems:   Please see the history of present illness.    All other systems reviewed and are negative.     EKGs/Labs/Other Test Reviewed:    EKG:  EKG performed November 2023 that I personally reviewed demonstrates atrial fibrillation with LVH; EKG performed today that I personally reviewed demonstrates rate controlled atrial fibrillation.  Prior CV studies:  Echo 06/10/2022 1. Left ventricular ejection fraction, by estimation, is 40 to 45%. The  left ventricle has mildly decreased function. The left ventricle  demonstrates global hypokinesis. There is moderate left ventricular  hypertrophy. Left ventricular diastolic  parameters are indeterminate.   2. Right ventricular systolic function is normal. The right ventricular  size is normal. There is normal pulmonary artery systolic pressure. The  estimated right ventricular systolic pressure is 86.7 mmHg.   3. Left atrial size was moderately dilated.   4. The mitral valve is normal in structure. No evidence of mitral valve  regurgitation. No evidence of mitral stenosis.   5. The aortic valve is tricuspid. Aortic valve regurgitation is not  visualized. No aortic stenosis is present.   6. The inferior vena cava is normal in size with greater than 50%  respiratory variability, suggesting right atrial pressure of 3 mmHg.      PET Stress Test 05/08/2019 [facility]): Impressions: 1. No  evidence of ischemia or infarction  2. Normal left wall motion  3. Abnormal decreased ejection fraction  4. Normal coronary flow reserve  5. TID is normal  6. No significant coronary artery calcification noted on CT obtained for attenuation correction  7. This is a low risk scan  8. Small pulmonary nodule seen at the left lower lobe      Echocardiogram 05/08/2019 [facility]): Impressions:  1. Overall left ventricular ejection fraction is estimated at 40 to 45%.   2. Mildly to moderately decreased global left ventricular systolic function.   3. Mild septal left ventricular hypertrophy.  4. Mild to moderately increased left ventricular internal cavity size.   5. Mildly dilated left atrium.   6. Mild mitral annular calcification.   7. GLS -7% (reduced).   Other studies Reviewed: Review of the additional studies/records demonstrates: No imaging available demonstrating aortic atherosclerosis  Recent Labs: 06/08/2022: B Natriuretic Peptide 45.8 06/09/2022: TSH 1.019 06/10/2022: ALT 23; Hemoglobin 15.8; Magnesium 2.1; Platelets 180 06/30/2022: BUN 13; Creatinine, Ser 0.86; Potassium 4.3; Sodium 139   Recent Lipid Panel No results found for: "CHOL", "TRIG", "HDL", "LDLCALC", "LDLDIRECT"  Risk Assessment/Calculations:     CHA2DS2-VASc Score = 2   This indicates a 2.2% annual risk of stroke. The patient's score is based upon: CHF History: 1 HTN History: 1 Diabetes History: 0 Stroke History: 0 Vascular Disease History: 0 Age Score: 0 Gender Score: 0    STOP-Bang Score:  8            Physical Exam:    VS:  BP 118/76   Pulse 71   Ht 6\' 4"  (1.93 m)   Wt 298 lb 6.4 oz (135.4 kg)   SpO2 97%   BMI 36.32 kg/m    Wt Readings from Last 3 Encounters:  08/13/22 298 lb 6.4 oz (135.4 kg)  07/16/22 300 lb (136.1 kg)  06/16/22 (!) 304 lb (137.9 kg)    GENERAL:  No apparent distress, AOx3 HEENT:  No carotid bruits, +2 carotid impulses, no scleral icterus; elevated  JVD CAR: Irregular rate and rhythm no murmurs, gallops, rubs, or thrills RES:  Clear to auscultation bilaterally ABD:  Soft, nontender, nondistended, positive bowel sounds x 4 VASC:  +2 radial pulses, +2 carotid pulses, palpable pedal pulses NEURO:  CN 2-12 grossly intact; motor and sensory grossly intact PSYCH:  No active depression or anxiety EXT:  No edema, ecchymosis, or cyanosis  Signed, [name], MD  08/13/2022 [facility] [place], [place], [place]  [address] Phone: [phone]; Fax: [phone]   Note:  This document was prepared using Dragon voice recognition software and may include unintentional dictation errors.

## 2022-08-13 ENCOUNTER — Encounter: Payer: Self-pay | Admitting: Internal Medicine

## 2022-08-13 ENCOUNTER — Ambulatory Visit: Payer: Medicaid Other | Attending: Internal Medicine | Admitting: Internal Medicine

## 2022-08-13 VITALS — BP 118/76 | HR 71 | Ht 76.0 in | Wt 298.4 lb

## 2022-08-13 DIAGNOSIS — Z6836 Body mass index (BMI) 36.0-36.9, adult: Secondary | ICD-10-CM

## 2022-08-13 DIAGNOSIS — I5042 Chronic combined systolic (congestive) and diastolic (congestive) heart failure: Secondary | ICD-10-CM

## 2022-08-13 DIAGNOSIS — R0683 Snoring: Secondary | ICD-10-CM | POA: Diagnosis not present

## 2022-08-13 DIAGNOSIS — I480 Paroxysmal atrial fibrillation: Secondary | ICD-10-CM

## 2022-08-13 MED ORDER — FUROSEMIDE 40 MG PO TABS: ORAL_TABLET | ORAL | 3 refills | Status: DC

## 2022-08-13 MED ORDER — EMPAGLIFLOZIN 10 MG PO TABS: 10.0000 mg | ORAL_TABLET | Freq: Every day | ORAL | 3 refills | Status: DC

## 2022-08-13 NOTE — Patient Instructions (Signed)
Medication Instructions:  Your physician has recommended you make the following change in your medication:  START: furosemide 60 mg by mouth twice daily for 3 days  THEN DECREASE: to 40 mg by mouth twice daily  START: Empagliflozin (Jardiance) 10 mg by mouth once daily before breakfast *If you need a refill on your cardiac medications before your next appointment, please call your pharmacy*   Lab Work: AT NEXT OFFICE VISIT: BMP  If you have labs (blood work) drawn today and your tests are completely normal, you will receive your results only by: [place] (if you have MyChart) OR A paper copy in the mail If you have any lab test that is abnormal or we need to change your treatment, we will call you to review the results.   Testing/Procedures: Your physician has requested that you have a sleep study  Your physician has referred you to see our Pharmacy Clinic.    Follow-Up: At [facility], you and your health needs are our priority.  As part of our continuing mission to provide you with exceptional heart care, we have created designated Provider Care Teams.  These Care Teams include your primary Cardiologist (physician) and Advanced Practice Providers (APPs -  Physician Assistants and Nurse Practitioners) who all work together to provide you with the care you need, when you need it.  We recommend signing up for the patient portal called "MyChart".  Sign up information is provided on this After Visit Summary.  MyChart is used to connect with patients for Virtual Visits (Telemedicine).  Patients are able to view lab/test results, encounter notes, upcoming appointments, etc.  Non-urgent messages can be sent to your provider as well.   To learn more about what you can do with MyChart, go to [email or web address].    Your next appointment:   NEXT WEEK with DR. [employer]

## 2022-08-14 ENCOUNTER — Telehealth: Payer: Self-pay

## 2022-08-14 NOTE — Progress Notes (Unsigned)
Cardiology Office Note:    Date:  08/20/2022   ID:  Riley Jefferson Pages, DOB May 14, 1961, MRN [id number]  PCP:  System, Provider Not In   [facility] Providers Cardiologist:  Lenna Sciara, MD Referring MD: No ref. provider found   Chief Complaint/Reason for Referral:  Cardiology follow up  PATIENT DID NOT APPEAR FOR APPOINTMENT   ASSESSMENT:    1. Chronic combined systolic and diastolic heart failure (HCC)   2. Paroxysmal atrial fibrillation (Riley Jefferson)   3. BMI 36.0-36.9,adult   4. Snoring      PLAN:    In order of problems listed above:  Chronic combined systolic and diastolic heart failure: Continue Toprol-XL 25 mg, Entresto, and Jardiance 10 mg daily.   PAF: Continue Toprol and apixaban for now. Elevated BMI:  Referred to pharmacy for recommendations. Snoring:  OSA evaluation has been ordered.             Dispo:  No follow-ups on file.      Medication Adjustments/Labs and Tests Ordered: Current medicines are reviewed at length with the patient today.  Concerns regarding medicines are outlined above.  The following changes have been made:     Labs/tests ordered: No orders of the defined types were placed in this encounter.   Medication Changes: No orders of the defined types were placed in this encounter.    Current medicines are reviewed at length with the patient today.  The patient does not have concerns regarding medicines.   History of Present Illness:    FOCUSED PROBLEM LIST:   Atrial fibrillation on Eliquis; CV2 score 2 Nonischemic cardiomyopathy EF 40-45%; PET stress 2020 negative BMI 36  08/13/2022 visit: The patient is a 62 y.o. male with the indicated medical history here for cardiology follow up.  The patient had been admitted with acute on chronic combined diastolic and systolic heart failure in November.  His troponins and BNP were normal.  He was started on lasix 20mg  and discharged.  He was seen in November in the outpatient clinic with  dyspnea.  He was not able to start [place] or [place] and LCSW was contacted.  He was seen in December and was noted to be disoriented and reported breathing difficulty when stressed.  Today he tells me that he has been more short of breath of late.  He tells me his weight has been going up over the last several weeks.  He reports dyspnea with exertion and orthopnea as well as some paroxysmal nocturnal dyspnea.  He denies any presyncope or syncope.  He has some chest heaviness at times but this is not routine.  He fortunately has not required any emergency room visits or hospitalizations.  He has been compliant with his medical therapy.  He does tell me that he snores and endorses daytime somnolence.  Plan: Start Jardiance 10 mg daily and increase Lasix to 60 mg twice daily for 3 days then down to a new chronic dose of 40 mg twice a day with a BMP in 1 week; refer to pharmacy for elevated BMI; refer for sleep apnea evaluation given snoring.  Follow-up in 1 week.  Today:          Current Medications: No outpatient medications have been marked as taking for the 08/20/22 encounter (Office Visit) with Riley Osmond, MD.     Allergies:    Patient has no known allergies.   Social History:   Social History   Tobacco Use   Smoking status: Former  Packs/day: 1.00    Years: 20.00    Total pack years: 20.00    Types: Cigarettes    Quit date: 09/21/2001    Years since quitting: 20.9   Smokeless tobacco: Never  Vaping Use   Vaping Use: Never used  Substance Use Topics   Alcohol use: Never   Drug use: Never     Family Hx: Family History  Problem Relation Age of Onset   Stroke Father      Review of Systems:   Please see the history of present illness.    All other systems reviewed and are negative.     EKGs/Labs/Other Test Reviewed:    EKG:  EKG performed November 2023 that I personally reviewed demonstrates atrial fibrillation with LVH; EKG performed today that I personally  reviewed demonstrates rate controlled atrial fibrillation.  Prior CV studies:  Echo 06/10/2022 1. Left ventricular ejection fraction, by estimation, is 40 to 45%. The  left ventricle has mildly decreased function. The left ventricle  demonstrates global hypokinesis. There is moderate left ventricular  hypertrophy. Left ventricular diastolic  parameters are indeterminate.   2. Right ventricular systolic function is normal. The right ventricular  size is normal. There is normal pulmonary artery systolic pressure. The  estimated right ventricular systolic pressure is 54.0 mmHg.   3. Left atrial size was moderately dilated.   4. The mitral valve is normal in structure. No evidence of mitral valve  regurgitation. No evidence of mitral stenosis.   5. The aortic valve is tricuspid. Aortic valve regurgitation is not  visualized. No aortic stenosis is present.   6. The inferior vena cava is normal in size with greater than 50%  respiratory variability, suggesting right atrial pressure of 3 mmHg.      PET Stress Test 05/08/2019 [facility]): Impressions: 1. No evidence of ischemia or infarction  2. Normal left wall motion  3. Abnormal decreased ejection fraction  4. Normal coronary flow reserve  5. TID is normal  6. No significant coronary artery calcification noted on CT obtained for attenuation correction  7. This is a low risk scan  8. Small pulmonary nodule seen at the left lower lobe      Echocardiogram 05/08/2019 [facility]): Impressions:  1. Overall left ventricular ejection fraction is estimated at 40 to 45%.   2. Mildly to moderately decreased global left ventricular systolic function.   3. Mild septal left ventricular hypertrophy.   4. Mild to moderately increased left ventricular internal cavity size.   5. Mildly dilated left atrium.   6. Mild mitral annular calcification.   7. GLS -7% (reduced).   Other studies Reviewed: Review of the additional  studies/records demonstrates: No imaging available demonstrating aortic atherosclerosis  Recent Labs: 06/08/2022: B Natriuretic Peptide 45.8 06/09/2022: TSH 1.019 06/10/2022: ALT 23; Hemoglobin 15.8; Magnesium 2.1; Platelets 180 06/30/2022: BUN 13; Creatinine, Ser 0.86; Potassium 4.3; Sodium 139   Recent Lipid Panel No results found for: "CHOL", "TRIG", "HDL", "LDLCALC", "LDLDIRECT"  Risk Assessment/Calculations:     CHA2DS2-VASc Score = 2   This indicates a 2.2% annual risk of stroke. The patient's score is based upon: CHF History: 1 HTN History: 1 Diabetes History: 0 Stroke History: 0 Vascular Disease History: 0 Age Score: 0 Gender Score: 0    STOP-Bang Score:  8         Signed, Riley Osmond, MD  08/20/2022 11:37 AM    [address], [place],   [address]  Phone: [phone]; Fax: [phone]   Note:  This document was prepared using Dragon voice recognition software and may include unintentional dictation errors.

## 2022-08-14 NOTE — Telephone Encounter (Signed)
Riley Jefferson ordered by Dr. Ali Lowe. Patient was given all instructions and watched educational video. He knows we will receive a call from our office once approved.

## 2022-08-17 NOTE — Telephone Encounter (Signed)
Work note given to pt at 08/13/22 OV.

## 2022-08-20 ENCOUNTER — Ambulatory Visit: Payer: Medicaid Other | Attending: Internal Medicine | Admitting: Internal Medicine

## 2022-08-20 ENCOUNTER — Ambulatory Visit: Payer: Medicaid Other | Attending: Internal Medicine

## 2022-08-20 DIAGNOSIS — I5042 Chronic combined systolic (congestive) and diastolic (congestive) heart failure: Secondary | ICD-10-CM

## 2022-08-20 DIAGNOSIS — R0683 Snoring: Secondary | ICD-10-CM

## 2022-08-20 DIAGNOSIS — I480 Paroxysmal atrial fibrillation: Secondary | ICD-10-CM

## 2022-08-20 DIAGNOSIS — Z6836 Body mass index (BMI) 36.0-36.9, adult: Secondary | ICD-10-CM

## 2022-08-20 LAB — BASIC METABOLIC PANEL
BUN/Creatinine Ratio: 16 (ref 10–24)
BUN: 18 mg/dL (ref 8–27)
CO2: 22 mmol/L (ref 20–29)
Calcium: 9.6 mg/dL (ref 8.6–10.2)
Chloride: 101 mmol/L (ref 96–106)
Creatinine, Ser: 1.14 mg/dL (ref 0.76–1.27)
Glucose: 117 mg/dL — ABNORMAL HIGH (ref 70–99)
Potassium: 3.6 mmol/L (ref 3.5–5.2)
Sodium: 137 mmol/L (ref 134–144)
eGFR: 72 mL/min/{1.73_m2} (ref >59)

## 2022-08-24 ENCOUNTER — Telehealth: Payer: Self-pay

## 2022-08-24 NOTE — Telephone Encounter (Signed)
**Note De-Identified [place] [facility]us Obfuscation** Rochele Pages (Key: [id number]) PA Case ID #: [id number] Rx #: [id number] Outcome: Approved today Coverage Starts on: 08/24/2022 12:00:00 AM, Coverage Ends on: 08/24/2023 12:00:00 AM. Authorization Expiration Date: 08/23/2023 Drug Jardiance 10MG  tablets ePA cloud logo Form [employer] Healthy [place] Electronic [place] Form [phone] NCPDP) Original Claim Info 45  I have notified [place], [address] (Ph: [phone]) of this approval.

## 2022-08-25 ENCOUNTER — Ambulatory Visit: Payer: 59 | Admitting: Internal Medicine

## 2022-08-31 NOTE — Telephone Encounter (Signed)
ERROR

## 2022-09-03 ENCOUNTER — Encounter: Payer: Self-pay | Admitting: Nurse Practitioner

## 2022-09-03 ENCOUNTER — Ambulatory Visit: Payer: Medicaid Other | Attending: Nurse Practitioner | Admitting: Nurse Practitioner

## 2022-09-03 VITALS — BP 128/80 | HR 83 | Ht 76.0 in | Wt 303.4 lb

## 2022-09-03 DIAGNOSIS — I4891 Unspecified atrial fibrillation: Secondary | ICD-10-CM | POA: Diagnosis not present

## 2022-09-03 DIAGNOSIS — R0683 Snoring: Secondary | ICD-10-CM | POA: Diagnosis not present

## 2022-09-03 DIAGNOSIS — Z7901 Long term (current) use of anticoagulants: Secondary | ICD-10-CM

## 2022-09-03 DIAGNOSIS — I480 Paroxysmal atrial fibrillation: Secondary | ICD-10-CM

## 2022-09-03 DIAGNOSIS — R0609 Other forms of dyspnea: Secondary | ICD-10-CM

## 2022-09-03 DIAGNOSIS — I5042 Chronic combined systolic (congestive) and diastolic (congestive) heart failure: Secondary | ICD-10-CM | POA: Diagnosis not present

## 2022-09-03 NOTE — Telephone Encounter (Signed)
Sleep study pending insurance card information. Patient has been notified to have his card scanned at next office visit.

## 2022-09-03 NOTE — Patient Instructions (Signed)
Medication Instructions:   START Jardiance one (1) tablet by mouth ( 10 mg ) daily.   *If you need a refill on your cardiac medications before your next appointment, please call your pharmacy*   Lab Work:  TODAY!!!! BMET  If you have labs (blood work) drawn today and your tests are completely normal, you will receive your results only by: MyChart Message (if you have MyChart) OR A paper copy in the mail If you have any lab test that is abnormal or we need to change your treatment, we will call you to review the results.   Testing/Procedures:  None ordered.  Follow-Up: At [facility], you and your health needs are our priority.  As part of our continuing mission to provide you with exceptional heart care, we have created designated Provider Care Teams.  These Care Teams include your primary Cardiologist (physician) and Advanced Practice Providers (APPs -  Physician Assistants and Nurse Practitioners) who all work together to provide you with the care you need, when you need it.  We recommend signing up for the patient portal called "MyChart".  Sign up information is provided on this After Visit Summary.  MyChart is used to connect with patients for Virtual Visits (Telemedicine).  Patients are able to view lab/test results, encounter notes, upcoming appointments, etc.  Non-urgent messages can be sent to your provider as well.   To learn more about what you can do with MyChart, go to [email or web address].    Your next appointment:   3 month(s)  Provider:   Eligha Bridegroom, NP         Other Instructions  Mediterranean Diet A Mediterranean diet refers to food and lifestyle choices that are based on the traditions of countries located on the [place]. It focuses on eating more fruits, vegetables, whole grains, beans, nuts, seeds, and heart-healthy fats, and eating less dairy, meat, eggs, and processed foods with added sugar, salt, and fat. This way of eating has  been shown to help prevent certain conditions and improve outcomes for people who have chronic diseases, like kidney disease and heart disease. What are tips for following this plan? Reading food labels Check the serving size of packaged foods. For foods such as rice and pasta, the serving size refers to the amount of cooked product, not dry. Check the total fat in packaged foods. Avoid foods that have saturated fat or trans fats. Check the ingredient list for added sugars, such as corn syrup. Shopping  Buy a variety of foods that offer a balanced diet, including: Fresh fruits and vegetables (produce). Grains, beans, nuts, and seeds. Some of these may be available in unpackaged forms or large amounts (in bulk). Fresh seafood. Poultry and eggs. Low-fat dairy products. Buy whole ingredients instead of prepackaged foods. Buy fresh fruits and vegetables in-season from local farmers markets. Buy plain frozen fruits and vegetables. If you do not have access to quality fresh seafood, buy precooked frozen shrimp or canned fish, such as tuna, salmon, or sardines. Stock your pantry so you always have certain foods on hand, such as olive oil, canned tuna, canned tomatoes, rice, pasta, and beans. Cooking Cook foods with extra-virgin olive oil instead of using butter or other vegetable oils. Have meat as a side dish, and have vegetables or grains as your main dish. This means having meat in small portions or adding small amounts of meat to foods like pasta or stew. Use beans or vegetables instead of meat in common dishes like chili  or lasagna. Experiment with different cooking methods. Try roasting, broiling, steaming, and sauting vegetables. Add frozen vegetables to soups, stews, pasta, or rice. Add nuts or seeds for added healthy fats and plant protein at each meal. You can add these to yogurt, salads, or vegetable dishes. Marinate fish or vegetables using olive oil, lemon juice, garlic, and fresh  herbs. Meal planning Plan to eat one vegetarian meal one day each week. Try to work up to two vegetarian meals, if possible. Eat seafood two or more times a week. Have healthy snacks readily available, such as: Vegetable sticks with hummus. Greek yogurt. Fruit and nut trail mix. Eat balanced meals throughout the week. This includes: Fruit: 2-3 servings a day. Vegetables: 4-5 servings a day. Low-fat dairy: 2 servings a day. Fish, poultry, or lean meat: 1 serving a day. Beans and legumes: 2 or more servings a week. Nuts and seeds: 1-2 servings a day. Whole grains: 6-8 servings a day. Extra-virgin olive oil: 3-4 servings a day. Limit red meat and sweets to only a few servings a month. Lifestyle  Cook and eat meals together with your family, when possible. Drink enough fluid to keep your urine pale yellow. Be physically active every day. This includes: Aerobic exercise like running or swimming. Leisure activities like gardening, walking, or housework. Get 7-8 hours of sleep each night. If recommended by your health care provider, drink red wine in moderation. This means 1 glass a day for nonpregnant women and 2 glasses a day for men. A glass of wine equals 5 oz (150 mL). What foods should I eat? Fruits Apples. Apricots. Avocado. Berries. Bananas. Cherries. Dates. Figs. Grapes. Lemons. Melon. Oranges. Peaches. Plums. Pomegranate. Vegetables Artichokes. Beets. Broccoli. Cabbage. Carrots. Eggplant. Green beans. Chard. Kale. Spinach. Onions. Leeks. Peas. Squash. Tomatoes. Peppers. Radishes. Grains Whole-grain pasta. Brown rice. Bulgur wheat. Polenta. Couscous. Whole-wheat bread. Orpah Cobb. Meats and other proteins Beans. Almonds. Sunflower seeds. Pine nuts. Peanuts. Cod. Salmon. Scallops. Shrimp. Tuna. Tilapia. Clams. Oysters. Eggs. Poultry without skin. Dairy Low-fat milk. Cheese. Greek yogurt. Fats and oils Extra-virgin olive oil. Avocado oil. Grapeseed oil. Beverages Water.  Red wine. Herbal tea. Sweets and desserts Greek yogurt with honey. Baked apples. Poached pears. Trail mix. Seasonings and condiments Basil. Cilantro. Coriander. Cumin. Mint. Parsley. Sage. Rosemary. Tarragon. Garlic. Oregano. Thyme. Pepper. Balsamic vinegar. Tahini. Hummus. Tomato sauce. Olives. Mushrooms. The items listed above may not be a complete list of foods and beverages you can eat. Contact a dietitian for more information. What foods should I limit? This is a list of foods that should be eaten rarely or only on special occasions. Fruits Fruit canned in syrup. Vegetables Deep-fried potatoes (french fries). Grains Prepackaged pasta or rice dishes. Prepackaged cereal with added sugar. Prepackaged snacks with added sugar. Meats and other proteins Beef. Pork. Lamb. Poultry with skin. Hot dogs. Tomasa Blase. Dairy Ice cream. Sour cream. Whole milk. Fats and oils Butter. Canola oil. Vegetable oil. Beef fat (tallow). Lard. Beverages Juice. Sugar-sweetened soft drinks. Beer. Liquor and spirits. Sweets and desserts Cookies. Cakes. Pies. Candy. Seasonings and condiments Mayonnaise. Pre-made sauces and marinades. The items listed above may not be a complete list of foods and beverages you should limit. Contact a dietitian for more information. Summary The Mediterranean diet includes both food and lifestyle choices. Eat a variety of fresh fruits and vegetables, beans, nuts, seeds, and whole grains. Limit the amount of red meat and sweets that you eat. If recommended by your health care provider, drink red wine in moderation.  This means 1 glass a day for nonpregnant women and 2 glasses a day for men. A glass of wine equals 5 oz (150 mL). This information is not intended to replace advice given to you by your health care provider. Make sure you discuss any questions you have with your health care provider. Document Revised: 08/18/2019 Document Reviewed: 06/15/2019 Elsevier Patient Education   [place] Eating Plan DASH stands for Dietary Approaches to Stop Hypertension. The DASH eating plan is a healthy eating plan that has been shown to: Reduce high blood pressure (hypertension). Reduce your risk for type 2 diabetes, heart disease, and stroke. Help with weight loss. What are tips for following this plan? Reading food labels Check food labels for the amount of salt (sodium) per serving. Choose foods with less than 5 percent of the Daily Value of sodium. Generally, foods with less than 300 milligrams (mg) of sodium per serving fit into this eating plan. To find whole grains, look for the word "whole" as the first word in the ingredient list. Shopping Buy products labeled as "low-sodium" or "no salt added." Buy fresh foods. Avoid canned foods and pre-made or frozen meals. Cooking Avoid adding salt when cooking. Use salt-free seasonings or herbs instead of table salt or sea salt. Check with your health care provider or pharmacist before using salt substitutes. Do not fry foods. Cook foods using healthy methods such as baking, boiling, grilling, roasting, and broiling instead. Cook with heart-healthy oils, such as olive, canola, avocado, soybean, or sunflower oil. Meal planning  Eat a balanced diet that includes: 4 or more servings of fruits and 4 or more servings of vegetables each day. Try to fill one-half of your plate with fruits and vegetables. 6-8 servings of whole grains each day. Less than 6 oz (170 g) of lean meat, poultry, or fish each day. A 3-oz (85-g) serving of meat is about the same size as a deck of cards. One egg equals 1 oz (28 g). 2-3 servings of low-fat dairy each day. One serving is 1 cup (237 mL). 1 serving of nuts, seeds, or beans 5 times each week. 2-3 servings of heart-healthy fats. Healthy fats called omega-3 fatty acids are found in foods such as walnuts, flaxseeds, fortified milks, and eggs. These fats are also found in cold-water fish, such  as sardines, salmon, and mackerel. Limit how much you eat of: Canned or prepackaged foods. Food that is high in trans fat, such as some fried foods. Food that is high in saturated fat, such as fatty meat. Desserts and other sweets, sugary drinks, and other foods with added sugar. Full-fat dairy products. Do not salt foods before eating. Do not eat more than 4 egg yolks a week. Try to eat at least 2 vegetarian meals a week. Eat more home-cooked food and less restaurant, buffet, and fast food. Lifestyle When eating at a restaurant, ask that your food be prepared with less salt or no salt, if possible. If you drink alcohol: Limit how much you use to: 0-1 drink a day for women who are not pregnant. 0-2 drinks a day for men. Be aware of how much alcohol is in your drink. In the [place] one drink equals one 12 oz bottle of beer (355 mL), one 5 oz glass of wine (148 mL), or one 1 oz glass of hard liquor (44 mL). General information Avoid eating more than 2,300 mg of salt a day. If you have hypertension, you may need to reduce your  sodium intake to 1,500 mg a day. Work with your health care provider to maintain a healthy body weight or to lose weight. Ask what an ideal weight is for you. Get at least 30 minutes of exercise that causes your heart to beat faster (aerobic exercise) most days of the week. Activities may include walking, swimming, or biking. Work with your health care provider or dietitian to adjust your eating plan to your individual calorie needs. What foods should I eat? Fruits All fresh, dried, or frozen fruit. Canned fruit in natural juice (without added sugar). Vegetables Fresh or frozen vegetables (raw, steamed, roasted, or grilled). Low-sodium or reduced-sodium tomato and vegetable juice. Low-sodium or reduced-sodium tomato sauce and tomato paste. Low-sodium or reduced-sodium canned vegetables. Grains Whole-grain or whole-wheat bread. Whole-grain or whole-wheat pasta. Brown  rice. [place]. Bulgur. Whole-grain and low-sodium cereals. Pita bread. Low-fat, low-sodium crackers. Whole-wheat flour tortillas. Meats and other proteins Skinless chicken or [place]. Ground chicken or [place]. Pork with fat trimmed off. Fish and seafood. Egg whites. Dried beans, peas, or lentils. Unsalted nuts, nut butters, and seeds. Unsalted canned beans. Lean cuts of beef with fat trimmed off. Low-sodium, lean precooked or cured meat, such as sausages or meat loaves. Dairy Low-fat (1%) or fat-free (skim) milk. Reduced-fat, low-fat, or fat-free cheeses. Nonfat, low-sodium ricotta or cottage cheese. Low-fat or nonfat yogurt. Low-fat, low-sodium cheese. Fats and oils Soft margarine without trans fats. Vegetable oil. Reduced-fat, low-fat, or light mayonnaise and salad dressings (reduced-sodium). Canola, safflower, olive, avocado, soybean, and sunflower oils. Avocado. Seasonings and condiments Herbs. Spices. Seasoning mixes without salt. Other foods Unsalted popcorn and pretzels. Fat-free sweets. The items listed above may not be a complete list of foods and beverages you can eat. Contact a dietitian for more information. What foods should I avoid? Fruits Canned fruit in a light or heavy syrup. Fried fruit. Fruit in cream or butter sauce. Vegetables Creamed or fried vegetables. Vegetables in a cheese sauce. Regular canned vegetables (not low-sodium or reduced-sodium). Regular canned tomato sauce and paste (not low-sodium or reduced-sodium). Regular tomato and vegetable juice (not low-sodium or reduced-sodium). Angie Fava. Olives. Grains Baked goods made with fat, such as croissants, muffins, or some breads. Dry pasta or rice meal packs. Meats and other proteins Fatty cuts of meat. Ribs. Fried meat. Berniece Salines. Bologna, salami, and other precooked or cured meats, such as sausages or meat loaves. Fat from the back of a pig (fatback). Bratwurst. Salted nuts and seeds. Canned beans with added salt.  Canned or smoked fish. Whole eggs or egg yolks. Chicken or [place] with skin. Dairy Whole or 2% milk, cream, and half-and-half. Whole or full-fat cream cheese. Whole-fat or sweetened yogurt. Full-fat cheese. Nondairy creamers. Whipped toppings. Processed cheese and cheese spreads. Fats and oils Butter. Stick margarine. Lard. Shortening. Ghee. Bacon fat. Tropical oils, such as coconut, palm kernel, or palm oil. Seasonings and condiments Onion salt, garlic salt, seasoned salt, table salt, and sea salt. Worcestershire sauce. Tartar sauce. Barbecue sauce. Teriyaki sauce. Soy sauce, including reduced-sodium. Steak sauce. Canned and packaged gravies. Fish sauce. Oyster sauce. Cocktail sauce. Store-bought horseradish. Ketchup. Mustard. Meat flavorings and tenderizers. Bouillon cubes. Hot sauces. Pre-made or packaged marinades. Pre-made or packaged taco seasonings. Relishes. Regular salad dressings. Other foods Salted popcorn and pretzels. The items listed above may not be a complete list of foods and beverages you should avoid. Contact a dietitian for more information. Where to find more information National Heart, Lung, and Blood Institute: https://wilson-eaton.com/ American Heart Association: www.heart.org Academy of  Nutrition and Dietetics: www.eatright.[place]: www.kidney.org Summary The DASH eating plan is a healthy eating plan that has been shown to reduce high blood pressure (hypertension). It may also reduce your risk for type 2 diabetes, heart disease, and stroke. When on the DASH eating plan, aim to eat more fresh fruits and vegetables, whole grains, lean proteins, low-fat dairy, and heart-healthy fats. With the DASH eating plan, you should limit salt (sodium) intake to 2,300 mg a day. If you have hypertension, you may need to reduce your sodium intake to 1,500 mg a day. Work with your health care provider or dietitian to adjust your eating plan to your individual calorie  needs. This information is not intended to replace advice given to you by your health care provider. Make sure you discuss any questions you have with your health care provider. Document Revised: 06/16/2019 Document Reviewed: 06/16/2019 Elsevier Patient Education  [place].

## 2022-09-03 NOTE — Progress Notes (Signed)
Cardiology Office Note:    Date:  09/03/2022   ID:  Rochele Pages, DOB 1961-01-01, MRN [id number]  PCP:  System, Provider Not In   [place] Providers Cardiologist:  [name], MD     Referring MD: No ref. provider found   Chief Complaint: chronic HFrEF  History of Present Illness:    Riley Jefferson is a pleasant 62 y.o. male with a hx of chronic atrial fibrillation on chronic anticoagulation, HFrEF with EF 40-45% in 04/2019, HTN, and prior tobacco use  Admission 11/13-11/15/23 seen by cardiology for evaluation of pain and shortness of breath.  Has been followed by cardiology in [place]. Atrial fibrillation dating back to 2004, was previously on Coumadin but unable to tolerate due to significant bleeding (hemoptysis, hematuria, epistaxis, and bleeding from eyes). Not able to afford DOACs. Per review of notes in Care Everywhere, long history of dyspnea on exertion.  Prior Myoview 09/2015 showed no evidence of ischemia, EF 31%.  Echo at that time showed LVEF 40 to 45% and severe left atrial enlargement.  Admitted at a hospital in [place] in 04/2022 for sudden onset of shortness of breath and then developed chest pain in the ED.  PET stress 04/2019 showed no evidence of ischemia or infarction.  Echo at that time showed LVEF 40 to 45% with reduced global longitudinal strain of -7% and mild LVH. He moved to [facility] in 06/2021 and has not yet established with PCP or cardiologist.  He presented to ED with sudden onset of shortness of breath in the setting of being very upset with someone at work.  Reports intermittent shortness of breath and chest pain also outside of panic attacks.  Symptoms usually last for couple of minutes and then resolve on their own.  Sometimes symptoms worsen with the and improved when going inside.  Usually feels his heart racing.  Has been told he snores and stops breathing at night and will occasionally wake up gasping for air. Occasional  lightheadedness/dizziness especially if he is outside in the heat, no syncope.  EKG showed atrial fibrillation at 68 bpm with LVH and mild ST depression and T wave inversions in V5 and 6.  High-sensitivity troponin negative x2, BNP normal.  CXR with no acute findings.  K 2.1. Telemetry revealed occasional pauses 2.22 sec with HR in the 40s around those times but very brief. Potassium normalized with repletion.  He was placed on Eliquis.  Shortness of breath and chest discomfort improved, felt to be atypical for angina.  He was placed on Lasix 20 mg twice daily for LVEF 40-45%. He was discharged on 06/10/22.  Seen by me on 06/16/2022 for follow-up.  Reported shortness of breath.  Woke up this morning breathing hard.  When he sits and slows his breathing it improves.  Has mild sternal chest pressure that occurs randomly, thinks it occurs more often with problems getting his breath.  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  Feels anxious at times because he cannot get a good breath. No edema, presyncope, syncope, orthopnea, PND.  He requested social work to be engaged in patient's care.  Contacted by LCSW on 12/6 to offer assistance with cost of medical therapy.  He reported shortness of breath.  He was contacted by RN and reported SOB on exertion.  He had not started his [place] or [place].  ER precautions were reviewed.  Return office visit 07/16/2022 with me.  Reported he is "maintaining."  Thinks his problems started when he was in the [employer], has PTSD.  He is stressed by his job and his family. Seems disoriented.  Is referencing troubles he had but no specific cardiac symptoms. Offered hospital admission but he wants to go home.  Reports problems breathing when he gets stressed.  It was difficult for him to get here today because he does not sleep well.  He denied chest pain, palpitations, edema, presyncope, syncope.  Difficult to discern if he is  having orthopnea or PND.  He still had not started Entresto.  Pharmacy verified the medication is ready to be picked up.  His weight was down.  Cardiology clinic visit on 08/13/2022 with Dr. Ali Lowe.  He reported that weight had been going up over the last several weeks.  He was having dyspnea with exertion and orthopnea as well as some paroxysmal nocturnal dyspnea.  He denied presyncope or syncope. He reported compliance with medical therapy.  He was advised to start Lasix 60 mg twice daily for 3 days then decrease to 40 mg twice daily.  He was advised to start Jardiance 10 mg daily and return in 1 week for follow-up.  Home sleep study was ordered.  Today, he is here alone for follow-up. Reports he is "maintaining." Appears to be more focused today than last time I saw him. Is in the process of applying for jobs. Reports he needs a job that will allow him to take breaks when he needs to. Continues to have some orthopnea, has shortness of breath also after a few minutes of yard work.  Says it quickly resolves when he sits for a little while.  No PND, edema, presyncope, syncope. No chest pain. Would like to work so that he does not gain more weight.  Has Jardiance at home but has not started it because he does not want to take 5 pills a day.  Past Medical History:  Diagnosis Date   Atrial fibrillation ([place])    Hypertension     History reviewed. No pertinent surgical history.  Current Medications: Current Meds  Medication Sig   acetaminophen (TYLENOL) 325 MG tablet Take 2 tablets (650 mg total) by mouth every 6 (six) hours as needed for mild pain (or Fever >/= 101).   apixaban (ELIQUIS) 5 MG TABS tablet Take 1 tablet (5 mg total) by mouth 2 (two) times daily.   empagliflozin (JARDIANCE) 10 MG TABS tablet Take 1 tablet (10 mg total) by mouth daily before breakfast.   furosemide (LASIX) 40 MG tablet Furosemide 60 mg twice daily for 3 days then 40 mg twice daily (Patient taking differently: Taking 40  mg twice a day)   metoprolol succinate (TOPROL-XL) 25 MG 24 hr tablet Take 0.5 tablets (12.5 mg total) by mouth daily.   sacubitril-valsartan (ENTRESTO) 49-51 MG Take 1 tablet by mouth 2 (two) times daily.     Allergies:   Patient has no known allergies.   Social History   Socioeconomic History   Marital status: Single    Spouse name: Not on file   Number of children: Not on file   Years of education: Not on file   Highest education level: Not on file  Occupational History   Not on file  Tobacco Use   Smoking status: Former    Packs/day: 1.00    Years: 20.00    Total pack years: 20.00    Types: Cigarettes    Quit date: 09/21/2001  Years since quitting: 20.9   Smokeless tobacco: Never  Vaping Use   Vaping Use: Never used  Substance and Sexual Activity   Alcohol use: Never   Drug use: Never   Sexual activity: Not on file  Other Topics Concern   Not on file  Social History Narrative   Not on file   Social Determinants of Health   Financial Resource Strain: Not on file  Food Insecurity: Not on file  Transportation Needs: Not on file  Physical Activity: Not on file  Stress: Not on file  Social Connections: Not on file     Family History: The patient's family history includes Stroke in his father.  ROS:   Please see the history of present illness.    + orthopnea + DOE All other systems reviewed and are negative.  Labs/Other Studies Reviewed:    The following studies were reviewed today:  Echo 06/10/2022 1. Left ventricular ejection fraction, by estimation, is 40 to 45%. The  left ventricle has mildly decreased function. The left ventricle  demonstrates global hypokinesis. There is moderate left ventricular  hypertrophy. Left ventricular diastolic  parameters are indeterminate.   2. Right ventricular systolic function is normal. The right ventricular  size is normal. There is normal pulmonary artery systolic pressure. The  estimated right ventricular  systolic pressure is 23.2 mmHg.   3. Left atrial size was moderately dilated.   4. The mitral valve is normal in structure. No evidence of mitral valve  regurgitation. No evidence of mitral stenosis.   5. The aortic valve is tricuspid. Aortic valve regurgitation is not  visualized. No aortic stenosis is present.   6. The inferior vena cava is normal in size with greater than 50%  respiratory variability, suggesting right atrial pressure of 3 mmHg.    PET Stress Test 05/08/2019 [facility]): Impressions: 1. No evidence of ischemia or infarction  2. Normal left wall motion  3. Abnormal decreased ejection fraction  4. Normal coronary flow reserve  5. TID is normal  6. No significant coronary artery calcification noted on CT obtained for attenuation correction  7. This is a low risk scan  8. Small pulmonary nodule seen at the left lower lobe    Echocardiogram 05/08/2019 [facility]): Impressions:  1. Overall left ventricular ejection fraction is estimated at 40 to 45%.   2. Mildly to moderately decreased global left ventricular systolic function.   3. Mild septal left ventricular hypertrophy.   4. Mild to moderately increased left ventricular internal cavity size.   5. Mildly dilated left atrium.   6. Mild mitral annular calcification.   7. GLS -7% (reduced).   Recent Labs: 06/08/2022: B Natriuretic Peptide 45.8 06/09/2022: TSH 1.019 06/10/2022: ALT 23; Hemoglobin 15.8; Magnesium 2.1; Platelets 180 08/20/2022: BUN 18; Creatinine, Ser 1.14; Potassium 3.6; Sodium 137  Recent Lipid Panel No results found for: "CHOL", "TRIG", "HDL", "CHOLHDL", "VLDL", "LDLCALC", "LDLDIRECT"   Risk Assessment/Calculations:    CHA2DS2-VASc Score = 2  {This indicates a 2.2% annual risk of stroke. The patient's score is based upon: CHF History: 1 HTN History: 1 Diabetes History: 0 Stroke History: 0 Vascular Disease History: 0 Age Score: 0 Gender Score: 0    Physical Exam:    VS:   BP 128/80   Pulse 83   Ht 6\' 4"  (1.93 m)   Wt (!) 303 lb 6.4 oz (137.6 kg)   SpO2 95%   BMI 36.93 kg/m     Wt Readings from Last 3 Encounters:  09/03/22 (!) 303 lb 6.4 oz (137.6 kg)  08/13/22 298 lb 6.4 oz (135.4 kg)  07/16/22 300 lb (136.1 kg)     GEN: Obese, well developed in no acute distress HEENT: Normal NECK: No JVD; No carotid bruits CARDIAC: Irregular RR, no murmurs, rubs, gallops RESPIRATORY:  Clear to auscultation without rales, wheezing or rhonchi  ABDOMEN: Soft, non-tender, non-distended MUSCULOSKELETAL:  No edema; No deformity. 2+ pedal pulses, equal bilaterally SKIN: Warm and dry NEUROLOGIC:  Alert and oriented x 3 PSYCHIATRIC:  Normal affect   EKG:  EKG is not ordered today.     Diagnoses:    1. Chronic combined systolic and diastolic heart failure (HCC)   2. Paroxysmal atrial fibrillation (HCC)   3. Atrial fibrillation with slow ventricular response (HCC)   4. Snoring   5. Chronic anticoagulation   6. DOE (dyspnea on exertion)     Assessment and Plan:     HFrEF/DOE: LVEF 40 to 45%, global HK of LV, indeterminate diastolic parameters, normal RV, no significant valve disease on echo 06/10/22. Weight is up today but he states he cannot take any more Lasix. Has orthopnea, but no PND or edema.  Body habitus makes it difficult to assess for fluid overload. Wants to get to work so he will not eat as much. Does not know what he should and should not eat. Will provide education for him.  Has Jardiance at home but did not want to add another pill.  I emphasized the importance of his medications to improve heart function and symptoms of shortness of breath and orthopnea. He is agreeable to start Jardiance 10 mg daily. Will get BMP today.   Atrial fibrillation  on chronic anticoagulation: Clinically appears to be in a fib today. HR is well-controlled. Occasional palpitations. No bleeding problems. Continue Eliquis, metoprolol.   Hypertension: BP is well controlled.    Sleep apnea: Stop bang score is 8. Was given [place] sleep study at last office visit but no one has contacted him with code to get started.  We work to get this issue resolved today.  Encouraged him to notify us if he does not hear from someone in the next few days.    Disposition: 3 months with me  Medication Adjustments/Labs and Tests Ordered: Current medicines are reviewed at length with the patient today.  Concerns regarding medicines are outlined above.  Orders Placed This Encounter  Procedures   Basic Metabolic Panel (BMET)   No orders of the defined types were placed in this encounter.   Patient Instructions  Medication Instructions:   START Jardiance one (1) tablet by mouth ( 10 mg ) daily.   *If you need a refill on your cardiac medications before your next appointment, please call your pharmacy*   Lab Work:  TODAY!!!! BMET  If you have labs (blood work) drawn today and your tests are completely normal, you will receive your results only by: MyChart Message (if you have MyChart) OR A paper copy in the mail If you have any lab test that is abnormal or we need to change your treatment, we will call you to review the results.   Testing/Procedures:  None ordered.  Follow-Up: At [facility], you and your health needs are our priority.  As part of our continuing mission to provide you with exceptional heart care, we have created designated Provider Care Teams.  These Care Teams include your primary Cardiologist (physician) and Advanced Practice Providers (APPs -  Physician Assistants and Nurse  Practitioners) who all work together to provide you with the care you need, when you need it.  We recommend signing up for the patient portal called "MyChart".  Sign up information is provided on this After Visit Summary.  MyChart is used to connect with patients for Virtual Visits (Telemedicine).  Patients are able to view lab/test results, encounter notes, upcoming  appointments, etc.  Non-urgent messages can be sent to your provider as well.   To learn more about what you can do with MyChart, go to [email or web address].    Your next appointment:   3 month(s)  Provider:   Eligha Bridegroom, NP         Other Instructions  Mediterranean Diet A Mediterranean diet refers to food and lifestyle choices that are based on the traditions of countries located on the [place] Sea. It focuses on eating more fruits, vegetables, whole grains, beans, nuts, seeds, and heart-healthy fats, and eating less dairy, meat, eggs, and processed foods with added sugar, salt, and fat. This way of eating has been shown to help prevent certain conditions and improve outcomes for people who have chronic diseases, like kidney disease and heart disease. What are tips for following this plan? Reading food labels Check the serving size of packaged foods. For foods such as rice and pasta, the serving size refers to the amount of cooked product, not dry. Check the total fat in packaged foods. Avoid foods that have saturated fat or trans fats. Check the ingredient list for added sugars, such as corn syrup. Shopping  Buy a variety of foods that offer a balanced diet, including: Fresh fruits and vegetables (produce). Grains, beans, nuts, and seeds. Some of these may be available in unpackaged forms or large amounts (in bulk). Fresh seafood. Poultry and eggs. Low-fat dairy products. Buy whole ingredients instead of prepackaged foods. Buy fresh fruits and vegetables in-season from local farmers markets. Buy plain frozen fruits and vegetables. If you do not have access to quality fresh seafood, buy precooked frozen shrimp or canned fish, such as tuna, salmon, or sardines. Stock your pantry so you always have certain foods on hand, such as olive oil, canned tuna, canned tomatoes, rice, pasta, and beans. Cooking Cook foods with extra-virgin olive oil instead of using butter or other  vegetable oils. Have meat as a side dish, and have vegetables or grains as your main dish. This means having meat in small portions or adding small amounts of meat to foods like pasta or stew. Use beans or vegetables instead of meat in common dishes like chili or lasagna. Experiment with different cooking methods. Try roasting, broiling, steaming, and sauting vegetables. Add frozen vegetables to soups, stews, pasta, or rice. Add nuts or seeds for added healthy fats and plant protein at each meal. You can add these to yogurt, salads, or vegetable dishes. Marinate fish or vegetables using olive oil, lemon juice, garlic, and fresh herbs. Meal planning Plan to eat one vegetarian meal one day each week. Try to work up to two vegetarian meals, if possible. Eat seafood two or more times a week. Have healthy snacks readily available, such as: Vegetable sticks with hummus. Greek yogurt. Fruit and nut trail mix. Eat balanced meals throughout the week. This includes: Fruit: 2-3 servings a day. Vegetables: 4-5 servings a day. Low-fat dairy: 2 servings a day. Fish, poultry, or lean meat: 1 serving a day. Beans and legumes: 2 or more servings a week. Nuts and seeds: 1-2 servings a day. Whole grains: 6-8 servings  a day. Extra-virgin olive oil: 3-4 servings a day. Limit red meat and sweets to only a few servings a month. Lifestyle  Cook and eat meals together with your family, when possible. Drink enough fluid to keep your urine pale yellow. Be physically active every day. This includes: Aerobic exercise like running or swimming. Leisure activities like gardening, walking, or housework. Get 7-8 hours of sleep each night. If recommended by your health care provider, drink red wine in moderation. This means 1 glass a day for nonpregnant women and 2 glasses a day for men. A glass of wine equals 5 oz (150 mL). What foods should I eat? Fruits Apples. Apricots. Avocado. Berries. Bananas. Cherries.  Dates. Figs. Grapes. Lemons. Melon. Oranges. Peaches. Plums. Pomegranate. Vegetables Artichokes. Beets. Broccoli. Cabbage. Carrots. Eggplant. Green beans. Chard. Kale. Spinach. Onions. Leeks. Peas. Squash. Tomatoes. Peppers. Radishes. Grains Whole-grain pasta. Brown rice. Bulgur wheat. Polenta. Couscous. Whole-wheat bread. Orpah Cobb. Meats and other proteins Beans. Almonds. Sunflower seeds. Pine nuts. Peanuts. Cod. Salmon. Scallops. Shrimp. Tuna. Tilapia. Clams. Oysters. Eggs. Poultry without skin. Dairy Low-fat milk. Cheese. Greek yogurt. Fats and oils Extra-virgin olive oil. Avocado oil. Grapeseed oil. Beverages Water. Red wine. Herbal tea. Sweets and desserts Greek yogurt with honey. Baked apples. Poached pears. Trail mix. Seasonings and condiments Basil. Cilantro. Coriander. Cumin. Mint. Parsley. Sage. Rosemary. Tarragon. Garlic. Oregano. Thyme. Pepper. Balsamic vinegar. Tahini. Hummus. Tomato sauce. Olives. Mushrooms. The items listed above may not be a complete list of foods and beverages you can eat. Contact a dietitian for more information. What foods should I limit? This is a list of foods that should be eaten rarely or only on special occasions. Fruits Fruit canned in syrup. Vegetables Deep-fried potatoes (french fries). Grains Prepackaged pasta or rice dishes. Prepackaged cereal with added sugar. Prepackaged snacks with added sugar. Meats and other proteins Beef. Pork. Lamb. Poultry with skin. Hot dogs. Tomasa Blase. Dairy Ice cream. Sour cream. Whole milk. Fats and oils Butter. Canola oil. Vegetable oil. Beef fat (tallow). Lard. Beverages Juice. Sugar-sweetened soft drinks. Beer. Liquor and spirits. Sweets and desserts Cookies. Cakes. Pies. Candy. Seasonings and condiments Mayonnaise. Pre-made sauces and marinades. The items listed above may not be a complete list of foods and beverages you should limit. Contact a dietitian for more information. Summary The  Mediterranean diet includes both food and lifestyle choices. Eat a variety of fresh fruits and vegetables, beans, nuts, seeds, and whole grains. Limit the amount of red meat and sweets that you eat. If recommended by your health care provider, drink red wine in moderation. This means 1 glass a day for nonpregnant women and 2 glasses a day for men. A glass of wine equals 5 oz (150 mL). This information is not intended to replace advice given to you by your health care provider. Make sure you discuss any questions you have with your health care provider. Document Revised: 08/18/2019 Document Reviewed: 06/15/2019 Elsevier Patient Education  2023 Elsevier Inc. DASH Eating Plan DASH stands for Dietary Approaches to Stop Hypertension. The DASH eating plan is a healthy eating plan that has been shown to: Reduce high blood pressure (hypertension). Reduce your risk for type 2 diabetes, heart disease, and stroke. Help with weight loss. What are tips for following this plan? Reading food labels Check food labels for the amount of salt (sodium) per serving. Choose foods with less than 5 percent of the Daily Value of sodium. Generally, foods with less than 300 milligrams (mg) of sodium per serving fit into this  eating plan. To find whole grains, look for the word "whole" as the first word in the ingredient list. Shopping Buy products labeled as "low-sodium" or "no salt added." Buy fresh foods. Avoid canned foods and pre-made or frozen meals. Cooking Avoid adding salt when cooking. Use salt-free seasonings or herbs instead of table salt or sea salt. Check with your health care provider or pharmacist before using salt substitutes. Do not fry foods. Cook foods using healthy methods such as baking, boiling, grilling, roasting, and broiling instead. Cook with heart-healthy oils, such as olive, canola, avocado, soybean, or sunflower oil. Meal planning  Eat a balanced diet that includes: 4 or more servings of  fruits and 4 or more servings of vegetables each day. Try to fill one-half of your plate with fruits and vegetables. 6-8 servings of whole grains each day. Less than 6 oz (170 g) of lean meat, poultry, or fish each day. A 3-oz (85-g) serving of meat is about the same size as a deck of cards. One egg equals 1 oz (28 g). 2-3 servings of low-fat dairy each day. One serving is 1 cup (237 mL). 1 serving of nuts, seeds, or beans 5 times each week. 2-3 servings of heart-healthy fats. Healthy fats called omega-3 fatty acids are found in foods such as walnuts, flaxseeds, fortified milks, and eggs. These fats are also found in cold-water fish, such as sardines, salmon, and mackerel. Limit how much you eat of: Canned or prepackaged foods. Food that is high in trans fat, such as some fried foods. Food that is high in saturated fat, such as fatty meat. Desserts and other sweets, sugary drinks, and other foods with added sugar. Full-fat dairy products. Do not salt foods before eating. Do not eat more than 4 egg yolks a week. Try to eat at least 2 vegetarian meals a week. Eat more home-cooked food and less restaurant, buffet, and fast food. Lifestyle When eating at a restaurant, ask that your food be prepared with less salt or no salt, if possible. If you drink alcohol: Limit how much you use to: 0-1 drink a day for women who are not pregnant. 0-2 drinks a day for men. Be aware of how much alcohol is in your drink. In the [place] one drink equals one 12 oz bottle of beer (355 mL), one 5 oz glass of wine (148 mL), or one 1 oz glass of hard liquor (44 mL). General information Avoid eating more than 2,300 mg of salt a day. If you have hypertension, you may need to reduce your sodium intake to 1,500 mg a day. Work with your health care provider to maintain a healthy body weight or to lose weight. Ask what an ideal weight is for you. Get at least 30 minutes of exercise that causes your heart to beat faster  (aerobic exercise) most days of the week. Activities may include walking, swimming, or biking. Work with your health care provider or dietitian to adjust your eating plan to your individual calorie needs. What foods should I eat? Fruits All fresh, dried, or frozen fruit. Canned fruit in natural juice (without added sugar). Vegetables Fresh or frozen vegetables (raw, steamed, roasted, or grilled). Low-sodium or reduced-sodium tomato and vegetable juice. Low-sodium or reduced-sodium tomato sauce and tomato paste. Low-sodium or reduced-sodium canned vegetables. Grains Whole-grain or whole-wheat bread. Whole-grain or whole-wheat pasta. Brown rice. Orpah Cobb. Bulgur. Whole-grain and low-sodium cereals. Pita bread. Low-fat, low-sodium crackers. Whole-wheat flour tortillas. Meats and other proteins Skinless chicken or [place].  Ground chicken or [place]. Pork with fat trimmed off. Fish and seafood. Egg whites. Dried beans, peas, or lentils. Unsalted nuts, nut butters, and seeds. Unsalted canned beans. Lean cuts of beef with fat trimmed off. Low-sodium, lean precooked or cured meat, such as sausages or meat loaves. Dairy Low-fat (1%) or fat-free (skim) milk. Reduced-fat, low-fat, or fat-free cheeses. Nonfat, low-sodium ricotta or cottage cheese. Low-fat or nonfat yogurt. Low-fat, low-sodium cheese. Fats and oils Soft margarine without trans fats. Vegetable oil. Reduced-fat, low-fat, or light mayonnaise and salad dressings (reduced-sodium). Canola, safflower, olive, avocado, soybean, and sunflower oils. Avocado. Seasonings and condiments Herbs. Spices. Seasoning mixes without salt. Other foods Unsalted popcorn and pretzels. Fat-free sweets. The items listed above may not be a complete list of foods and beverages you can eat. Contact a dietitian for more information. What foods should I avoid? Fruits Canned fruit in a light or heavy syrup. Fried fruit. Fruit in cream or butter  sauce. Vegetables Creamed or fried vegetables. Vegetables in a cheese sauce. Regular canned vegetables (not low-sodium or reduced-sodium). Regular canned tomato sauce and paste (not low-sodium or reduced-sodium). Regular tomato and vegetable juice (not low-sodium or reduced-sodium). Rosita Fire. Olives. Grains Baked goods made with fat, such as croissants, muffins, or some breads. Dry pasta or rice meal packs. Meats and other proteins Fatty cuts of meat. Ribs. Fried meat. Tomasa Blase. Bologna, salami, and other precooked or cured meats, such as sausages or meat loaves. Fat from the back of a pig (fatback). Bratwurst. Salted nuts and seeds. Canned beans with added salt. Canned or smoked fish. Whole eggs or egg yolks. Chicken or [place] with skin. Dairy Whole or 2% milk, cream, and half-and-half. Whole or full-fat cream cheese. Whole-fat or sweetened yogurt. Full-fat cheese. Nondairy creamers. Whipped toppings. Processed cheese and cheese spreads. Fats and oils Butter. Stick margarine. Lard. Shortening. Ghee. Bacon fat. Tropical oils, such as coconut, palm kernel, or palm oil. Seasonings and condiments Onion salt, garlic salt, seasoned salt, table salt, and sea salt. Worcestershire sauce. Tartar sauce. Barbecue sauce. Teriyaki sauce. Soy sauce, including reduced-sodium. Steak sauce. Canned and packaged gravies. Fish sauce. Oyster sauce. Cocktail sauce. Store-bought horseradish. Ketchup. Mustard. Meat flavorings and tenderizers. Bouillon cubes. Hot sauces. Pre-made or packaged marinades. Pre-made or packaged taco seasonings. Relishes. Regular salad dressings. Other foods Salted popcorn and pretzels. The items listed above may not be a complete list of foods and beverages you should avoid. Contact a dietitian for more information. Where to find more information National Heart, Lung, and Blood Institute: PopSteam.is American Heart Association: www.heart.org Academy of Nutrition and Dietetics:  www.eatright.org National Kidney Foundation: www.kidney.org Summary The DASH eating plan is a healthy eating plan that has been shown to reduce high blood pressure (hypertension). It may also reduce your risk for type 2 diabetes, heart disease, and stroke. When on the DASH eating plan, aim to eat more fresh fruits and vegetables, whole grains, lean proteins, low-fat dairy, and heart-healthy fats. With the DASH eating plan, you should limit salt (sodium) intake to 2,300 mg a day. If you have hypertension, you may need to reduce your sodium intake to 1,500 mg a day. Work with your health care provider or dietitian to adjust your eating plan to your individual calorie needs. This information is not intended to replace advice given to you by your health care provider. Make sure you discuss any questions you have with your health care provider. Document Revised: 06/16/2019 Document Reviewed: 06/16/2019 Elsevier Patient Education  2023 [employer].  Signed, Emmaline Life, NP  09/03/2022 5:10 PM    [place]

## 2022-09-04 LAB — BASIC METABOLIC PANEL
BUN/Creatinine Ratio: 17 (ref 10–24)
BUN: 14 mg/dL (ref 8–27)
CO2: 18 mmol/L — ABNORMAL LOW (ref 20–29)
Calcium: 9.5 mg/dL (ref 8.6–10.2)
Chloride: 104 mmol/L (ref 96–106)
Creatinine, Ser: 0.84 mg/dL (ref 0.76–1.27)
Glucose: 96 mg/dL (ref 70–99)
Potassium: 3.8 mmol/L (ref 3.5–5.2)
Sodium: 141 mmol/L (ref 134–144)
eGFR: 97 mL/min/{1.73_m2} (ref >59)

## 2022-09-04 NOTE — Telephone Encounter (Signed)
Prior Authorization for [facility] sent to [place] via web portal. Tracking Number .  READY- NO PA REQ

## 2022-09-04 NOTE — Telephone Encounter (Signed)
Left message ok to proceed with Itamar sleep study and PIN# [id number]. Left message if the pt would proceed with the study between tonight and early next week. If not able to do this , please call me back at [phone].

## 2022-09-08 ENCOUNTER — Telehealth: Payer: Self-pay

## 2022-09-08 DIAGNOSIS — I5042 Chronic combined systolic (congestive) and diastolic (congestive) heart failure: Secondary | ICD-10-CM

## 2022-09-08 NOTE — Telephone Encounter (Signed)
-----   Message from Emmaline Life, NP sent at 09/04/2022 10:28 AM EST ----- Kidney function and electrolytes are stable. Recommend that he start Jardiance and return in 2-3 weeks for repeat BMET.

## 2022-09-21 ENCOUNTER — Other Ambulatory Visit: Payer: Self-pay | Admitting: *Deleted

## 2022-09-21 DIAGNOSIS — Z79899 Other long term (current) drug therapy: Secondary | ICD-10-CM

## 2022-09-23 ENCOUNTER — Ambulatory Visit: Payer: Medicaid Other

## 2022-10-01 ENCOUNTER — Other Ambulatory Visit: Payer: Medicaid Other

## 2022-10-08 ENCOUNTER — Ambulatory Visit: Payer: Medicaid Other

## 2022-10-13 ENCOUNTER — Ambulatory Visit: Payer: Medicaid Other | Attending: Nurse Practitioner

## 2022-10-13 DIAGNOSIS — Z79899 Other long term (current) drug therapy: Secondary | ICD-10-CM

## 2022-10-14 ENCOUNTER — Other Ambulatory Visit: Payer: Self-pay | Admitting: *Deleted

## 2022-10-14 DIAGNOSIS — I5042 Chronic combined systolic (congestive) and diastolic (congestive) heart failure: Secondary | ICD-10-CM

## 2022-10-14 DIAGNOSIS — Z79899 Other long term (current) drug therapy: Secondary | ICD-10-CM

## 2022-10-14 DIAGNOSIS — I480 Paroxysmal atrial fibrillation: Secondary | ICD-10-CM

## 2022-10-14 LAB — BASIC METABOLIC PANEL
BUN/Creatinine Ratio: 12 (ref 10–24)
BUN: 17 mg/dL (ref 8–27)
CO2: 17 mmol/L — ABNORMAL LOW (ref 20–29)
Calcium: 10.1 mg/dL (ref 8.6–10.2)
Chloride: 109 mmol/L — ABNORMAL HIGH (ref 96–106)
Creatinine, Ser: 1.42 mg/dL — ABNORMAL HIGH (ref 0.76–1.27)
Glucose: 98 mg/dL (ref 70–99)
Potassium: 5.3 mmol/L — ABNORMAL HIGH (ref 3.5–5.2)
Sodium: 146 mmol/L — ABNORMAL HIGH (ref 134–144)
eGFR: 55 mL/min/{1.73_m2} — ABNORMAL LOW (ref >59)

## 2022-10-14 MED ORDER — FUROSEMIDE 40 MG PO TABS: 40.0000 mg | ORAL_TABLET | Freq: Every day | ORAL | 3 refills | Status: DC

## 2022-10-22 ENCOUNTER — Ambulatory Visit: Payer: Medicaid Other

## 2022-10-23 ENCOUNTER — Ambulatory Visit: Payer: Medicaid Other | Attending: Internal Medicine

## 2022-10-23 DIAGNOSIS — I480 Paroxysmal atrial fibrillation: Secondary | ICD-10-CM

## 2022-10-23 DIAGNOSIS — Z79899 Other long term (current) drug therapy: Secondary | ICD-10-CM

## 2022-10-23 DIAGNOSIS — I5042 Chronic combined systolic (congestive) and diastolic (congestive) heart failure: Secondary | ICD-10-CM

## 2022-10-24 LAB — BASIC METABOLIC PANEL
BUN/Creatinine Ratio: 16 (ref 10–24)
BUN: 18 mg/dL (ref 8–27)
CO2: 23 mmol/L (ref 20–29)
Calcium: 10 mg/dL (ref 8.6–10.2)
Chloride: 106 mmol/L (ref 96–106)
Creatinine, Ser: 1.11 mg/dL (ref 0.76–1.27)
Glucose: 78 mg/dL (ref 70–99)
Potassium: 4.9 mmol/L (ref 3.5–5.2)
Sodium: 141 mmol/L (ref 134–144)
eGFR: 74 mL/min/{1.73_m2} (ref >59)

## 2022-10-28 ENCOUNTER — Telehealth: Payer: Self-pay | Admitting: Nurse Practitioner

## 2022-10-28 ENCOUNTER — Telehealth: Payer: Self-pay | Admitting: Internal Medicine

## 2022-10-28 NOTE — Telephone Encounter (Signed)
Pt calling back to f/u on results. Please advise

## 2022-10-28 NOTE — Telephone Encounter (Signed)
Left voicemail for patient to return call to office. 

## 2022-10-28 NOTE — Telephone Encounter (Signed)
Patient is returning call in regards to labs. Requesting return call. Leave detailed message on VM if no answer.

## 2022-10-28 NOTE — Telephone Encounter (Signed)
Patient calling for his lab results. 

## 2022-10-29 NOTE — Telephone Encounter (Signed)
Left message to call the clinic. 

## 2022-10-29 NOTE — Telephone Encounter (Signed)
Riley Jefferson 10/28/2022 10:14 AM EDT Back to Top    Per phone note left lab results and recommendations on vm.

## 2022-10-30 NOTE — Telephone Encounter (Signed)
Debbe Bales 10/28/2022 10:14 AM EDT Back to Top    Per phone note left lab results and recommendations on vm.      _______________________________________________________________________  I am unsure if patient has been left the VM of results or has questions but I left him another VM to call us if he need results or anything.

## 2022-11-02 ENCOUNTER — Other Ambulatory Visit: Payer: Self-pay | Admitting: *Deleted

## 2022-11-02 MED ORDER — METOPROLOL SUCCINATE ER 25 MG PO TB24: 12.5000 mg | ORAL_TABLET | Freq: Every day | ORAL | 2 refills | Status: DC

## 2022-12-13 NOTE — Progress Notes (Unsigned)
Cardiology Office Note:    Date:  12/16/2022   ID:  Sherri Rad, DOB Oct 07, 1960, MRN [id number]  PCP:  System, Provider Not In   [facility] Providers Cardiologist:  Orbie Pyo, MD     Referring MD: No ref. provider found   Chief Complaint: chronic HFrEF  History of Present Illness:    Riley Jefferson is a pleasant 62 y.o. male with a hx of chronic atrial fibrillation on chronic anticoagulation, HFrEF with EF 40-45% in 04/2019, HTN, and prior tobacco use  Admission 11/13-11/15/23 seen by cardiology for evaluation of pain and shortness of breath.  Has been followed by cardiology in [place]. Atrial fibrillation dating back to 2004, was previously on Coumadin but unable to tolerate due to significant bleeding (hemoptysis, hematuria, epistaxis, and bleeding from eyes). Not able to afford DOACs. Per review of notes in Care Everywhere, long history of dyspnea on exertion.  Prior Myoview 09/2015 showed no evidence of ischemia, EF 31%.  Echo at that time showed LVEF 40 to 45% and severe left atrial enlargement.  Admitted at a hospital in [place] in 04/2022 for sudden onset of shortness of breath and then developed chest pain in the ED.  PET stress 04/2019 showed no evidence of ischemia or infarction.  Echo at that time showed LVEF 40 to 45% with reduced global longitudinal strain of -7% and mild LVH. He moved to [facility] in 06/2021 and has not yet established with PCP or cardiologist.  He presented to ED with sudden onset of shortness of breath in the setting of being very upset with someone at work.  Reports intermittent shortness of breath and chest pain also outside of panic attacks.  Symptoms usually last for couple of minutes and then resolve on their own.  Sometimes symptoms worsen with the and improved when going inside.  Usually feels his heart racing.  Has been told he snores and stops breathing at night and will occasionally wake up gasping for air. Occasional  lightheadedness/dizziness especially if he is outside in the heat, no syncope.  EKG showed atrial fibrillation at 68 bpm with LVH and mild ST depression and T wave inversions in V5 and 6.  High-sensitivity troponin negative x2, BNP normal.  CXR with no acute findings.  K 2.1. Telemetry revealed occasional pauses 2.22 sec with HR in the 40s around those times but very brief. Potassium normalized with repletion.  He was placed on Eliquis.  Shortness of breath and chest discomfort improved, felt to be atypical for angina.  He was placed on Lasix 20 mg twice daily for LVEF 40-45%. He was discharged on 06/10/22.  Seen by me on 06/16/2022 for follow-up.  Reported shortness of breath.  Woke up this morning breathing hard.  When he sits and slows his breathing it improves.  Has mild sternal chest pressure that occurs randomly, thinks it occurs more often with problems getting his breath.  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  Feels anxious at times because he cannot get a good breath. No edema, presyncope, syncope, orthopnea, PND.  He requested social work to be engaged in patient's care.  Contacted by LCSW on 12/6 to offer assistance with cost of medical therapy.  He reported shortness of breath.  He was contacted by RN and reported SOB on exertion.  He had not started his [place] or [place].  ER precautions were reviewed.  Return office visit 07/16/2022 with me.  Reported he is "maintaining."  Thinks his problems started when he was in the [employer], has PTSD.  He is stressed by his job and his family. Seems disoriented.  Is referencing troubles he had but no specific cardiac symptoms. Offered hospital admission but he wants to go home.  Reports problems breathing when he gets stressed.  It was difficult for him to get here today because he does not sleep well.  He denied chest pain, palpitations, edema, presyncope, syncope.  Difficult to discern if he is  having orthopnea or PND.  He still had not started Entresto.  Pharmacy verified the medication is ready to be picked up.  His weight was down.  Cardiology clinic visit on 08/13/2022 with Dr. Lynnette Caffey.  He reported that weight had been going up over the last several weeks.  He was having dyspnea with exertion and orthopnea as well as some paroxysmal nocturnal dyspnea.  He denied presyncope or syncope. He reported compliance with medical therapy.  He was advised to start Lasix 60 mg twice daily for 3 days then decrease to 40 mg twice daily.  He was advised to start Jardiance 10 mg daily and return in 1 week for follow-up.  Home sleep study was ordered.  Seen by me on 2/8/24for follow-up. Reported he is "maintaining." Appears to be more focused today than last time I saw him. Is in the process of applying for jobs. Reports he needs a job that will allow him to take breaks when he needs to. Continues to have some orthopnea, has shortness of breath also after a few minutes of yard work. Says it quickly resolves when he sits for a little while.  No PND, edema, presyncope, syncope. No chest pain. Would like to work so that he does not gain more weight.  Has Jardiance at home but has not started it because he does not want to take 5 pills a day. His kidney function was stable at office visit and he agreed to start Jardiance 10 mg daily with plan for follow-up labs. Home sleep study not completed, he stated he never got a call regarding clearance to proceed.   He returned for lab work on 10/13/22 and his potassium and sodium are slightly elevated.  We advised him to decrease Lasix to 40 mg daily and avoid high potassium foods and increase hydration with water.  Repeat BMET on 10/23/2022 revealed stable renal function and normalization of electrolyte abnormalities.  Today, he is here alone for follow-up. Stopped taking Jardiance and Entresto because they scared him, he thought they were attacking his kidneys. He did not  like to take 5 pills, thought it was too many. He works as a Comptroller, sits with clients, no heavy lifting or strenuous activity. BP was quite high at work this morning 298/124 per his report and he was sent home. Says he feels tired. He denies chest pain, shortness of breath, lower extremity edema, fatigue, palpitations, melena, hematuria, hemoptysis, diaphoresis, weakness, presyncope, syncope, orthopnea, and PND.    Past Medical History:  Diagnosis Date   Atrial fibrillation (HCC)    Hypertension     History reviewed. No pertinent surgical history.  Current Medications: Current Meds  Medication Sig   acetaminophen (TYLENOL) 325 MG tablet Take 2 tablets (650 mg total) by mouth every 6 (six) hours as needed for mild pain (or Fever >/= 101).   apixaban (ELIQUIS) 5 MG TABS tablet Take 1 tablet (5 mg total) by mouth 2 (two) times daily.  empagliflozin (JARDIANCE) 10 MG TABS tablet Take 10 mg by mouth daily.   furosemide (LASIX) 40 MG tablet Take 1 tablet (40 mg total) by mouth daily.   metoprolol succinate (TOPROL-XL) 25 MG 24 hr tablet Take 0.5 tablets (12.5 mg total) by mouth daily.   sacubitril-valsartan (ENTRESTO) 49-51 MG Take 1 tablet by mouth 2 (two) times daily.   [DISCONTINUED] empagliflozin (JARDIANCE) 10 MG TABS tablet Take 1 tablet (10 mg total) by mouth daily before breakfast.   [DISCONTINUED] sacubitril-valsartan (ENTRESTO) 49-51 MG Take 1 tablet by mouth 2 (two) times daily.     Allergies:   Patient has no known allergies.   Social History   Socioeconomic History   Marital status: Single    Spouse name: Not on file   Number of children: Not on file   Years of education: Not on file   Highest education level: Not on file  Occupational History   Not on file  Tobacco Use   Smoking status: Former    Packs/day: 1.00    Years: 20.00    Additional pack years: 0.00    Total pack years: 20.00    Types: Cigarettes    Quit date: 09/21/2001    Years since quitting: 21.2    Smokeless tobacco: Never  Vaping Use   Vaping Use: Never used  Substance and Sexual Activity   Alcohol use: Never   Drug use: Never   Sexual activity: Not on file  Other Topics Concern   Not on file  Social History Narrative   Not on file   Social Determinants of Health   Financial Resource Strain: Not on file  Food Insecurity: Not on file  Transportation Needs: Not on file  Physical Activity: Not on file  Stress: Not on file  Social Connections: Not on file     Family History: The patient's family history includes Stroke in his father.  ROS:   Please see the history of present illness.   All other systems reviewed and are negative.  Labs/Other Studies Reviewed:    The following studies were reviewed today:  Echo 06/10/2022 1. Left ventricular ejection fraction, by estimation, is 40 to 45%. The  left ventricle has mildly decreased function. The left ventricle  demonstrates global hypokinesis. There is moderate left ventricular  hypertrophy. Left ventricular diastolic  parameters are indeterminate.   2. Right ventricular systolic function is normal. The right ventricular  size is normal. There is normal pulmonary artery systolic pressure. The  estimated right ventricular systolic pressure is 23.2 mmHg.   3. Left atrial size was moderately dilated.   4. The mitral valve is normal in structure. No evidence of mitral valve  regurgitation. No evidence of mitral stenosis.   5. The aortic valve is tricuspid. Aortic valve regurgitation is not  visualized. No aortic stenosis is present.   6. The inferior vena cava is normal in size with greater than 50%  respiratory variability, suggesting right atrial pressure of 3 mmHg.    PET Stress Test 05/08/2019 [facility]): Impressions: 1. No evidence of ischemia or infarction  2. Normal left wall motion  3. Abnormal decreased ejection fraction  4. Normal coronary flow reserve  5. TID is normal  6. No significant coronary  artery calcification noted on CT obtained for attenuation correction  7. This is a low risk scan  8. Small pulmonary nodule seen at the left lower lobe    Echocardiogram 05/08/2019 [facility]): Impressions:  1. Overall left ventricular ejection fraction  is estimated at 40 to 45%.   2. Mildly to moderately decreased global left ventricular systolic function.   3. Mild septal left ventricular hypertrophy.   4. Mild to moderately increased left ventricular internal cavity size.   5. Mildly dilated left atrium.   6. Mild mitral annular calcification.   7. GLS -7% (reduced).   Recent Labs: 06/08/2022: B Natriuretic Peptide 45.8 06/09/2022: TSH 1.019 06/10/2022: ALT 23; Hemoglobin 15.8; Magnesium 2.1; Platelets 180 10/23/2022: BUN 18; Creatinine, Ser 1.11; Potassium 4.9; Sodium 141  Recent Lipid Panel No results found for: "CHOL", "TRIG", "HDL", "CHOLHDL", "VLDL", "LDLCALC", "LDLDIRECT"   Risk Assessment/Calculations:    CHA2DS2-VASc Score = 2  {This indicates a 2.2% annual risk of stroke. The patient's score is based upon: CHF History: 1 HTN History: 1 Diabetes History: 0 Stroke History: 0 Vascular Disease History: 0 Age Score: 0 Gender Score: 0    Physical Exam:    VS:  BP (!) 140/84   Pulse 65   Ht 6\' 4"  (1.93 m)   Wt 293 lb 9.6 oz (133.2 kg)   SpO2 96%   BMI 35.74 kg/m     Wt Readings from Last 3 Encounters:  12/16/22 293 lb 9.6 oz (133.2 kg)  09/03/22 (!) 303 lb 6.4 oz (137.6 kg)  08/13/22 298 lb 6.4 oz (135.4 kg)     GEN: Obese, well developed in no acute distress HEENT: Normal NECK: No JVD; No carotid bruits CARDIAC: Irregular RR, no murmurs, rubs, gallops RESPIRATORY:  Clear to auscultation without rales, wheezing or rhonchi  ABDOMEN: Soft, non-tender, non-distended MUSCULOSKELETAL:  No edema; No deformity. 2+ pedal pulses, equal bilaterally SKIN: Warm and dry NEUROLOGIC:  Alert and oriented x 3 PSYCHIATRIC:  Normal affect   EKG:  EKG is not  ordered today.     Diagnoses:    1. Chronic combined systolic and diastolic heart failure (HCC)   2. Medication management   3. Paroxysmal atrial fibrillation (HCC)   4. DOE (dyspnea on exertion)   5. Snoring   6. Atrial fibrillation with slow ventricular response (HCC)   7. Chronic anticoagulation   8. Primary hypertension      Assessment and Plan:     HFrEF/DOE: LVEF 40 to 45%, global HK of LV, indeterminate diastolic parameters, normal RV, no significant valve disease on echo 06/10/22. Unfortunately he stopped Sherryll Burger and [place], unsure when. BP was quite high this morning. I advised that BP was likely high due to stopping Entresto. Weight is down. He did not report any specific side effects just concerned that he was taking too many pills and that they were affecting his kidney function adversely. He is asymptomatic. Body habitus makes it difficult to assess for fluid overload. Agreeable to resume Jardiance 10 mg daily and Entresto 49-51 mg twice daily. Will get BMP today.   Atrial fibrillation  on chronic anticoagulation: HR is well controlled. Clinically appears to be in a fib today. He is asymptomatic. No bleeding concerns. Continue Eliquis 5 mg twice daily which is appropriate dose for stroke prevention for CHA2DS2-VASc score of 2. Continue metoprolol for rate control.   Hypertension: BP is elevated.  He stopped Sherryll Burger, is agreeable to resume.  I think BP will improve significantly once he is back on Entresto.  Sleep apnea: Stop bang score is 8. Was given Itamar sleep study at last office visit but he has never been contacted.  I have sent a message to our sleep team to try to get this resolved.  Disposition: 6 months with Dr. Lynnette Caffey  Medication Adjustments/Labs and Tests Ordered: Current medicines are reviewed at length with the patient today.  Concerns regarding medicines are outlined above.  Orders Placed This Encounter  Procedures   Comp Met (CMET)   Lipid  Profile   CBC   No orders of the defined types were placed in this encounter.   Patient Instructions  Medication Instructions:   RESTART Jardiance one (1) tablet by mouth ( 10 mg) daily.  RESTART Entresto one (1) tablet by mouth (49-51 mg) twice daily.   *If you need a refill on your cardiac medications before your next appointment, please call your pharmacy*   Lab Work:  TODAY!!! CMET/LIPID/CBC  If you have labs (blood work) drawn today and your tests are completely normal, you will receive your results only by: MyChart Message (if you have MyChart) OR A paper copy in the mail If you have any lab test that is abnormal or we need to change your treatment, we will call you to review the results.   Testing/Procedures:  None ordered.   Follow-Up: At [facility], you and your health needs are our priority.  As part of our continuing mission to provide you with exceptional heart care, we have created designated Provider Care Teams.  These Care Teams include your primary Cardiologist (physician) and Advanced Practice Providers (APPs -  Physician Assistants and Nurse Practitioners) who all work together to provide you with the care you need, when you need it.  We recommend signing up for the patient portal called "MyChart".  Sign up information is provided on this After Visit Summary.  MyChart is used to connect with patients for Virtual Visits (Telemedicine).  Patients are able to view lab/test results, encounter notes, upcoming appointments, etc.  Non-urgent messages can be sent to your provider as well.   To learn more about what you can do with MyChart, go to [email or web address].    Your next appointment:   6 month(s)  Provider:   Orbie Pyo, MD        Signed, Levi Aland, NP  12/16/2022 5:15 PM    [facility]

## 2022-12-16 ENCOUNTER — Encounter: Payer: Self-pay | Admitting: Nurse Practitioner

## 2022-12-16 ENCOUNTER — Ambulatory Visit: Payer: Medicaid Other | Attending: Nurse Practitioner | Admitting: Nurse Practitioner

## 2022-12-16 VITALS — BP 140/84 | HR 65 | Ht 76.0 in | Wt 293.6 lb

## 2022-12-16 DIAGNOSIS — I10 Essential (primary) hypertension: Secondary | ICD-10-CM

## 2022-12-16 DIAGNOSIS — R0609 Other forms of dyspnea: Secondary | ICD-10-CM | POA: Diagnosis not present

## 2022-12-16 DIAGNOSIS — I5042 Chronic combined systolic (congestive) and diastolic (congestive) heart failure: Secondary | ICD-10-CM | POA: Diagnosis not present

## 2022-12-16 DIAGNOSIS — Z79899 Other long term (current) drug therapy: Secondary | ICD-10-CM

## 2022-12-16 DIAGNOSIS — Z7901 Long term (current) use of anticoagulants: Secondary | ICD-10-CM

## 2022-12-16 DIAGNOSIS — I480 Paroxysmal atrial fibrillation: Secondary | ICD-10-CM

## 2022-12-16 DIAGNOSIS — I4891 Unspecified atrial fibrillation: Secondary | ICD-10-CM

## 2022-12-16 DIAGNOSIS — R0683 Snoring: Secondary | ICD-10-CM

## 2022-12-16 NOTE — Patient Instructions (Signed)
Medication Instructions:   RESTART Jardiance one (1) tablet by mouth ( 10 mg) daily.  RESTART Entresto one (1) tablet by mouth (49-51 mg) twice daily.   *If you need a refill on your cardiac medications before your next appointment, please call your pharmacy*   Lab Work:  TODAY!!! CMET/LIPID/CBC  If you have labs (blood work) drawn today and your tests are completely normal, you will receive your results only by: MyChart Message (if you have MyChart) OR A paper copy in the mail If you have any lab test that is abnormal or we need to change your treatment, we will call you to review the results.   Testing/Procedures:  None ordered.   Follow-Up: At [facility], you and your health needs are our priority.  As part of our continuing mission to provide you with exceptional heart care, we have created designated Provider Care Teams.  These Care Teams include your primary Cardiologist (physician) and Advanced Practice Providers (APPs -  Physician Assistants and Nurse Practitioners) who all work together to provide you with the care you need, when you need it.  We recommend signing up for the patient portal called "MyChart".  Sign up information is provided on this After Visit Summary.  MyChart is used to connect with patients for Virtual Visits (Telemedicine).  Patients are able to view lab/test results, encounter notes, upcoming appointments, etc.  Non-urgent messages can be sent to your provider as well.   To learn more about what you can do with MyChart, go to [email or web address].    Your next appointment:   6 month(s)  Provider:   Orbie Pyo, MD

## 2022-12-17 LAB — COMPREHENSIVE METABOLIC PANEL
ALT: 21 IU/L (ref 0–44)
AST: 22 IU/L (ref 0–40)
Albumin/Globulin Ratio: 1.2 (ref 1.2–2.2)
Albumin: 4.2 g/dL (ref 3.9–4.9)
Alkaline Phosphatase: 111 IU/L (ref 44–121)
BUN/Creatinine Ratio: 15 (ref 10–24)
BUN: 14 mg/dL (ref 8–27)
Bilirubin Total: 0.5 mg/dL (ref 0.0–1.2)
CO2: 19 mmol/L — ABNORMAL LOW (ref 20–29)
Calcium: 9.2 mg/dL (ref 8.6–10.2)
Chloride: 104 mmol/L (ref 96–106)
Creatinine, Ser: 0.95 mg/dL (ref 0.76–1.27)
Globulin, Total: 3.4 g/dL (ref 1.5–4.5)
Glucose: 84 mg/dL (ref 70–99)
Potassium: 4.1 mmol/L (ref 3.5–5.2)
Sodium: 141 mmol/L (ref 134–144)
Total Protein: 7.6 g/dL (ref 6.0–8.5)
eGFR: 89 mL/min/{1.73_m2} (ref >59)

## 2022-12-17 LAB — LIPID PANEL
Chol/HDL Ratio: 2.7 ratio (ref 0.0–5.0)
Cholesterol, Total: 150 mg/dL (ref 100–199)
HDL: 55 mg/dL (ref >39)
LDL Chol Calc (NIH): 82 mg/dL (ref 0–99)
Triglycerides: 62 mg/dL (ref 0–149)
VLDL Cholesterol Cal: 13 mg/dL (ref 5–40)

## 2022-12-17 LAB — CBC
Hematocrit: 43.4 % (ref 37.5–51.0)
Hemoglobin: 14.7 g/dL (ref 13.0–17.7)
MCH: 28 pg (ref 26.6–33.0)
MCHC: 33.9 g/dL (ref 31.5–35.7)
MCV: 83 fL (ref 79–97)
Platelets: 237 10*3/uL (ref 150–450)
RBC: 5.25 x10E6/uL (ref 4.14–5.80)
RDW: 14 % (ref 11.6–15.4)
WBC: 5.6 10*3/uL (ref 3.4–10.8)

## 2022-12-27 ENCOUNTER — Emergency Department (HOSPITAL_COMMUNITY)
Admission: EM | Admit: 2022-12-27 | Discharge: 2022-12-27 | Disposition: A | Discharge status: Home / Self Care | Payer: Medicaid Other | Source: Home / Self Care | Attending: Emergency Medicine | Admitting: Emergency Medicine

## 2022-12-27 ENCOUNTER — Encounter (HOSPITAL_COMMUNITY): Payer: Self-pay | Admitting: *Deleted

## 2022-12-27 ENCOUNTER — Other Ambulatory Visit: Payer: Self-pay

## 2022-12-27 ENCOUNTER — Emergency Department (HOSPITAL_COMMUNITY)
Admission: EM | Admit: 2022-12-27 | Discharge: 2022-12-27 | Disposition: A | Discharge status: Home / Self Care | Payer: Medicaid Other | Attending: Emergency Medicine | Admitting: Emergency Medicine

## 2022-12-27 ENCOUNTER — Encounter (HOSPITAL_COMMUNITY): Payer: Self-pay

## 2022-12-27 DIAGNOSIS — I10 Essential (primary) hypertension: Secondary | ICD-10-CM | POA: Insufficient documentation

## 2022-12-27 DIAGNOSIS — Z79899 Other long term (current) drug therapy: Secondary | ICD-10-CM | POA: Insufficient documentation

## 2022-12-27 DIAGNOSIS — R040 Epistaxis: Secondary | ICD-10-CM | POA: Insufficient documentation

## 2022-12-27 DIAGNOSIS — I4891 Unspecified atrial fibrillation: Secondary | ICD-10-CM | POA: Insufficient documentation

## 2022-12-27 DIAGNOSIS — Z7901 Long term (current) use of anticoagulants: Secondary | ICD-10-CM | POA: Insufficient documentation

## 2022-12-27 LAB — COMPREHENSIVE METABOLIC PANEL
ALT: 21 U/L (ref 0–44)
AST: 24 U/L (ref 15–41)
Albumin: 3.5 g/dL (ref 3.5–5.0)
Alkaline Phosphatase: 88 U/L (ref 38–126)
Anion gap: 13 (ref 5–15)
BUN: 14 mg/dL (ref 8–23)
CO2: 23 mmol/L (ref 22–32)
Calcium: 9.5 mg/dL (ref 8.9–10.3)
Chloride: 102 mmol/L (ref 98–111)
Creatinine, Ser: 1.01 mg/dL (ref 0.61–1.24)
GFR, Estimated: >60 mL/min (ref >60)
Glucose, Bld: 98 mg/dL (ref 70–99)
Potassium: 3.4 mmol/L — ABNORMAL LOW (ref 3.5–5.1)
Sodium: 138 mmol/L (ref 135–145)
Total Bilirubin: 0.6 mg/dL (ref 0.3–1.2)
Total Protein: 7.3 g/dL (ref 6.5–8.1)

## 2022-12-27 LAB — CBC
HCT: 44.9 % (ref 39.0–52.0)
Hemoglobin: 14.7 g/dL (ref 13.0–17.0)
MCH: 27.3 pg (ref 26.0–34.0)
MCHC: 32.7 g/dL (ref 30.0–36.0)
MCV: 83.5 fL (ref 80.0–100.0)
Platelets: 242 10*3/uL (ref 150–400)
RBC: 5.38 MIL/uL (ref 4.22–5.81)
RDW: 15.2 % (ref 11.5–15.5)
WBC: 6.7 10*3/uL (ref 4.0–10.5)
nRBC: 0 % (ref 0.0–0.2)

## 2022-12-27 MED ORDER — OXYMETAZOLINE HCL 0.05 % NA SOLN
1.0000 | Freq: Once | NASAL | Status: AC
  Administered 2022-12-27: 1 via NASAL
  Filled 2022-12-27: qty 30

## 2022-12-27 MED ORDER — TRANEXAMIC ACID FOR EPISTAXIS
500.0000 mg | Freq: Once | TOPICAL | Status: AC
  Administered 2022-12-27: 500 mg via TOPICAL
  Filled 2022-12-27: qty 10

## 2022-12-27 NOTE — ED Notes (Signed)
Pt refused DC VS.

## 2022-12-27 NOTE — Discharge Instructions (Addendum)
Please follow-up with your primary care provider regarding your symptoms and ER visit.  Today your nose stopped bleeding however we also used Afrin in your nostril which appeared to help.  Please take your medications that you are prescribed when you return home.  If symptoms change or worsen please return to ER.

## 2022-12-27 NOTE — ED Triage Notes (Signed)
Pt arrived POV from home stating his nose started bleeding about 45 mins ago but he cannot get it to stop.

## 2022-12-27 NOTE — ED Provider Notes (Signed)
[place] EMERGENCY DEPARTMENT AT [facility] Provider Note   CSN: [id number] Arrival date & time: 12/27/22  1010     History  Chief Complaint  Patient presents with   Epistaxis    Riley Jefferson is a 62 y.o. male history of A-fib on Eliquis presented with epistaxis that began this morning at 9:30 AM.  Patient is he was in the bathroom to start his day when his left nostril started to bleed.  Patient denies any headache, vision changes, head trauma, LOC, falls, chest pain, shob.  Patient states he was unable to take his medications this morning as this episode occurred before he could take his pills.    Home Medications Prior to Admission medications   Medication Sig Start Date End Date Taking? Authorizing Provider  acetaminophen (TYLENOL) 325 MG tablet Take 2 tablets (650 mg total) by mouth every 6 (six) hours as needed for mild pain (or Fever >/= 101). 06/10/22   Marguerita Merles Latif, DO  apixaban (ELIQUIS) 5 MG TABS tablet Take 1 tablet (5 mg total) by mouth 2 (two) times daily. 06/16/22   Swinyer, Zachary George, NP  empagliflozin (JARDIANCE) 10 MG TABS tablet Take 10 mg by mouth daily.    [provider]  furosemide (LASIX) 40 MG tablet Take 1 tablet (40 mg total) by mouth daily. 10/14/22 10/14/23  Swinyer, Zachary George, NP  metoprolol succinate (TOPROL-XL) 25 MG 24 hr tablet Take 0.5 tablets (12.5 mg total) by mouth daily. 11/02/22   Swinyer, Zachary George, NP  sacubitril-valsartan (ENTRESTO) 49-51 MG Take 1 tablet by mouth 2 (two) times daily.    [provider]      Allergies    Patient has no known allergies.    Review of Systems   Review of Systems  HENT:  Positive for nosebleeds.     Physical Exam Updated Vital Signs BP (!) 180/117   Pulse 63   Temp (!) 97.5 F (36.4 C) (Oral)   Resp (!) 21   Ht 6\' 4"  (1.93 m)   Wt 133.8 kg   SpO2 100%   BMI 35.91 kg/m  Physical Exam Constitutional:      General: He is not in acute distress. HENT:      Nose: Nose normal.     Comments: Not actively bleeding from L nostril    Mouth/Throat:     Mouth: Mucous membranes are moist.     Comments: No postnasal bleeding Eyes:     Extraocular Movements: Extraocular movements intact.     Conjunctiva/sclera: Conjunctivae normal.     Pupils: Pupils are equal, round, and reactive to light.  Neurological:     Mental Status: He is alert.     Sensory: Sensation is intact.     Coordination: Coordination is intact.     Comments: Vision grossly intact CN III-XII intact     ED Results / Procedures / Treatments   Labs (all labs ordered are listed, but only abnormal results are displayed) Labs Reviewed - No data to display  EKG None  Radiology No results found.  Procedures Procedures    Medications Ordered in ED Medications  oxymetazoline (AFRIN) 0.05 % nasal spray 1 spray (1 spray Each Nare Given 12/27/22 1058)    ED Course/ Medical Decision Making/ A&P                             Medical Decision Making Risk OTC drugs.  Sherri Rad 61 y.o. presented today for epistaxis. Working DDx that I considered at this time includes, but not limited to, anterior/posterior epistaxis, head trauma,.  R/o DDx: Posterior epistaxis, trauma: These are considered less likely due to history of present illness and physical exam findings  Review of prior external notes: 12/16/2022 office visit  Unique Tests and My Interpretation: None  Discussion with Independent Historian: None  Discussion of Management of Tests: None  Risk: Low: based on diagnostic testing/clinical impression and treatment plan  Risk Stratification Score: none  Staffed with Lynelle Doctor, MD  Plan: Patient presented for epistaxis. On exam patient was no acute distress.  Patient blood pressure was elevated however patient did not take his medications this morning and denied any new onset weakness, chest pain, shortness of breath, vision changes.  Patient did arrive with no pain  in his left nostril with dried blood on it however when neck is removed no active hemorrhage was noted.  Patient received two puffs of Afrin from the RN and was monitored for 30 minutes in which there was no bleeding.  Upon examination of patient's nostril no abnormalities were noted.  Patient lightly blew his nose and no blood clots were expelled.  Patient did not have any postnasal bleeding indicative of posterior nasal bleed.  At this time I suspect patient had anterior nosebleed that is now ceased.  Patient be discharged with primary care follow-up.  I encouraged patient to use medications at home that prescribed him when he gets home as his blood pressure is high.  Patient was given return precautions. Patient stable for discharge at this time.  Patient verbalized understanding of plan.         Final Clinical Impression(s) / ED Diagnoses Final diagnoses:  Epistaxis    Rx / DC Orders ED Discharge Orders     None         Remi Deter 12/27/22 1159    Linwood Dibbles, MD 12/28/22 [phone]

## 2022-12-27 NOTE — ED Triage Notes (Signed)
The pt is c/o a nosebleed that started earlier today  he was seen here no packing   now his nose is bleeding  for 45  minutes ago  the pt is on blood thinners but he has not taken today

## 2022-12-27 NOTE — ED Provider Notes (Signed)
[place] EMERGENCY DEPARTMENT AT [facility] Provider Note   CSN: [id number] Arrival date & time: 12/27/22  2003     History {Add pertinent medical, surgical, social history, OB history to HPI:1} Chief Complaint  Patient presents with   Epistaxis    Riley Jefferson is a 62 y.o. male.  62 year old male history of atrial fibrillation on Eliquis, hypertension presenting for epistaxis.  Patient was seen earlier today after developing spontaneous epistaxis out of the left nostril.  He was given Afrin and discharged home.  Short while ago he developed bleeding again out of his nose both nostrils.  He placed tissue with his nostrils to stop the bleeding.  He has no dizziness, syncope, chest pain, difficulty breathing.  No history of prior epistaxis.  No trauma.   Epistaxis      Home Medications Prior to Admission medications   Medication Sig Start Date End Date Taking? Authorizing Provider  acetaminophen (TYLENOL) 325 MG tablet Take 2 tablets (650 mg total) by mouth every 6 (six) hours as needed for mild pain (or Fever >/= 101). 06/10/22   Marguerita Merles Latif, DO  apixaban (ELIQUIS) 5 MG TABS tablet Take 1 tablet (5 mg total) by mouth 2 (two) times daily. 06/16/22   Swinyer, Zachary George, NP  empagliflozin (JARDIANCE) 10 MG TABS tablet Take 10 mg by mouth daily.    [provider]  furosemide (LASIX) 40 MG tablet Take 1 tablet (40 mg total) by mouth daily. 10/14/22 10/14/23  Swinyer, Zachary George, NP  metoprolol succinate (TOPROL-XL) 25 MG 24 hr tablet Take 0.5 tablets (12.5 mg total) by mouth daily. 11/02/22   Swinyer, Zachary George, NP  sacubitril-valsartan (ENTRESTO) 49-51 MG Take 1 tablet by mouth 2 (two) times daily.    [provider]      Allergies    Patient has no known allergies.    Review of Systems   Review of Systems  HENT:  Positive for nosebleeds.     Physical Exam Updated Vital Signs BP (!) 174/100 (BP Location: Right Arm)   Pulse 99   Temp  97.9 F (36.6 C)   Resp (!) 24   Ht 6\' 4"  (1.93 m)   Wt 133.8 kg   SpO2 97%   BMI 35.91 kg/m  Physical Exam Vitals and nursing note reviewed.  Constitutional:      General: He is not in acute distress.    Appearance: He is well-developed.  HENT:     Head: Normocephalic and atraumatic.     Nose:     Comments: Bilateral nares hemostatic.  Clot expelled from the left nare.  No posterior pharyngeal bleeding    Mouth/Throat:     Mouth: Mucous membranes are moist.     Pharynx: Oropharynx is clear.  Eyes:     Extraocular Movements: Extraocular movements intact.     Conjunctiva/sclera: Conjunctivae normal.     Pupils: Pupils are equal, round, and reactive to light.  Cardiovascular:     Rate and Rhythm: Normal rate and regular rhythm.     Heart sounds: No murmur heard. Pulmonary:     Effort: Pulmonary effort is normal. No respiratory distress.     Breath sounds: Normal breath sounds.  Abdominal:     Palpations: Abdomen is soft.     Tenderness: There is no abdominal tenderness.  Musculoskeletal:        General: No swelling.     Cervical back: Neck supple.  Skin:    General: Skin is  warm and dry.     Capillary Refill: Capillary refill takes less than 2 seconds.  Neurological:     Mental Status: He is alert.  Psychiatric:        Mood and Affect: Mood normal.     ED Results / Procedures / Treatments   Labs (all labs ordered are listed, but only abnormal results are displayed) Labs Reviewed  COMPREHENSIVE METABOLIC PANEL  CBC    EKG None  Radiology No results found.  Procedures Procedures  {Document cardiac monitor, telemetry assessment procedure when appropriate:1}  Medications Ordered in ED Medications - No data to display  ED Course/ Medical Decision Making/ A&P   {   Click here for ABCD2, HEART and other calculatorsREFRESH Note before signing :1}                          Medical Decision Making Amount and/or Complexity of Data Reviewed Labs:  ordered.  Risk OTC drugs.   62 year old male on Eliquis presenting for recurrent epistaxis.  Vital signs reviewed.  On exam he is currently hemostatic after placing tissue in his nose at home.  However does have some mild left-sided oozing when I removed the gauze he placed at home.  I placed TXA and Afrin soaked gauze in his bilateral naris.  Will plan for observation in the ED to see if this obtains hemostasis.  Low concern for posterior bleed at this time.  CMP notable for K3.4.  CBC with hemoglobin of 14.7, no signs of hemodynamically significant blood loss.  {Document critical care time when appropriate:1} {Document review of labs and clinical decision tools ie heart score, Chads2Vasc2 etc:1}  {Document your independent review of radiology images, and any outside records:1} {Document your discussion with family members, caretakers, and with consultants:1} {Document social determinants of health affecting pt's care:1} {Document your decision making why or why not admission, treatments were needed:1} Final Clinical Impression(s) / ED Diagnoses Final diagnoses:  None    Rx / DC Orders ED Discharge Orders     None

## 2022-12-27 NOTE — Discharge Instructions (Signed)
You have nasal packing in your left nare.  You need to call ENT tomorrow to schedule close follow-up.  If you develop repeat bleeding, fainting or any other new concerning symptoms you should return to the ED.

## 2022-12-28 ENCOUNTER — Emergency Department (HOSPITAL_COMMUNITY)
Admission: EM | Admit: 2022-12-28 | Discharge: 2022-12-28 | Disposition: A | Discharge status: Home / Self Care | Payer: Medicaid Other | Source: Home / Self Care | Attending: Emergency Medicine | Admitting: Emergency Medicine

## 2022-12-28 ENCOUNTER — Other Ambulatory Visit: Payer: Self-pay

## 2022-12-28 ENCOUNTER — Encounter (HOSPITAL_COMMUNITY): Payer: Self-pay

## 2022-12-28 ENCOUNTER — Observation Stay (HOSPITAL_COMMUNITY)
Admission: EM | Admit: 2022-12-28 | Discharge: 2022-12-29 | Disposition: A | Discharge status: Home / Self Care | Payer: Medicaid Other | Attending: Internal Medicine | Admitting: Internal Medicine

## 2022-12-28 DIAGNOSIS — I4891 Unspecified atrial fibrillation: Secondary | ICD-10-CM | POA: Diagnosis present

## 2022-12-28 DIAGNOSIS — I110 Hypertensive heart disease with heart failure: Secondary | ICD-10-CM | POA: Insufficient documentation

## 2022-12-28 DIAGNOSIS — I10 Essential (primary) hypertension: Secondary | ICD-10-CM | POA: Insufficient documentation

## 2022-12-28 DIAGNOSIS — R040 Epistaxis: Secondary | ICD-10-CM | POA: Diagnosis present

## 2022-12-28 DIAGNOSIS — F17210 Nicotine dependence, cigarettes, uncomplicated: Secondary | ICD-10-CM | POA: Diagnosis not present

## 2022-12-28 DIAGNOSIS — Z7901 Long term (current) use of anticoagulants: Secondary | ICD-10-CM

## 2022-12-28 DIAGNOSIS — E876 Hypokalemia: Secondary | ICD-10-CM | POA: Diagnosis not present

## 2022-12-28 DIAGNOSIS — I482 Chronic atrial fibrillation, unspecified: Secondary | ICD-10-CM | POA: Diagnosis present

## 2022-12-28 DIAGNOSIS — I5022 Chronic systolic (congestive) heart failure: Secondary | ICD-10-CM | POA: Diagnosis not present

## 2022-12-28 DIAGNOSIS — Z79899 Other long term (current) drug therapy: Secondary | ICD-10-CM | POA: Insufficient documentation

## 2022-12-28 LAB — BASIC METABOLIC PANEL
Anion gap: 6 (ref 5–15)
BUN: 14 mg/dL (ref 8–23)
CO2: 25 mmol/L (ref 22–32)
Calcium: 9.1 mg/dL (ref 8.9–10.3)
Chloride: 106 mmol/L (ref 98–111)
Creatinine, Ser: 0.82 mg/dL (ref 0.61–1.24)
GFR, Estimated: >60 mL/min (ref >60)
Glucose, Bld: 106 mg/dL — ABNORMAL HIGH (ref 70–99)
Potassium: 3.3 mmol/L — ABNORMAL LOW (ref 3.5–5.1)
Sodium: 137 mmol/L (ref 135–145)

## 2022-12-28 LAB — CBC WITH DIFFERENTIAL/PLATELET
Abs Immature Granulocytes: 0.01 10*3/uL (ref 0.00–0.07)
Basophils Absolute: 0.1 10*3/uL (ref 0.0–0.1)
Basophils Relative: 1 %
Eosinophils Absolute: 0.2 10*3/uL (ref 0.0–0.5)
Eosinophils Relative: 4 %
HCT: 44.8 % (ref 39.0–52.0)
Hemoglobin: 14.8 g/dL (ref 13.0–17.0)
Immature Granulocytes: 0 %
Lymphocytes Relative: 41 %
Lymphs Abs: 2.1 10*3/uL (ref 0.7–4.0)
MCH: 27.5 pg (ref 26.0–34.0)
MCHC: 33 g/dL (ref 30.0–36.0)
MCV: 83.3 fL (ref 80.0–100.0)
Monocytes Absolute: 0.6 10*3/uL (ref 0.1–1.0)
Monocytes Relative: 11 %
Neutro Abs: 2.3 10*3/uL (ref 1.7–7.7)
Neutrophils Relative %: 43 %
Platelets: 236 10*3/uL (ref 150–400)
RBC: 5.38 MIL/uL (ref 4.22–5.81)
RDW: 15.1 % (ref 11.5–15.5)
WBC: 5.2 10*3/uL (ref 4.0–10.5)
nRBC: 0 % (ref 0.0–0.2)

## 2022-12-28 LAB — HEMOGLOBIN AND HEMATOCRIT, BLOOD
HCT: 51 % (ref 39.0–52.0)
HCT: 49.7 % (ref 39.0–52.0)
Hemoglobin: 17.3 g/dL — ABNORMAL HIGH (ref 13.0–17.0)
Hemoglobin: 16.6 g/dL (ref 13.0–17.0)

## 2022-12-28 MED ORDER — ONDANSETRON HCL 4 MG/2ML IJ SOLN: 4.0000 mg | Freq: Four times a day (QID) | INTRAMUSCULAR | Status: DC | PRN

## 2022-12-28 MED ORDER — LORAZEPAM 0.5 MG PO TABS: 0.5000 mg | ORAL_TABLET | Freq: Four times a day (QID) | ORAL | Status: DC | PRN

## 2022-12-28 MED ORDER — HYDROCHLOROTHIAZIDE 12.5 MG PO TABS
12.5000 mg | ORAL_TABLET | Freq: Every day | ORAL | Status: DC
  Filled 2022-12-28: qty 1

## 2022-12-28 MED ORDER — LISINOPRIL 5 MG PO TABS
10.0000 mg | ORAL_TABLET | Freq: Every day | ORAL | Status: DC
  Filled 2022-12-28: qty 1

## 2022-12-28 MED ORDER — HYDRALAZINE HCL 20 MG/ML IJ SOLN
20.0000 mg | Freq: Once | INTRAMUSCULAR | Status: AC
  Administered 2022-12-28: 20 mg via INTRAVENOUS
  Filled 2022-12-28: qty 1

## 2022-12-28 MED ORDER — LATANOPROST 0.005 % OP SOLN
1.0000 [drp] | Freq: Every day | OPHTHALMIC | Status: DC
  Administered 2022-12-28: 1 [drp] via OPHTHALMIC
  Filled 2022-12-28: qty 2.5

## 2022-12-28 MED ORDER — TRANEXAMIC ACID FOR EPISTAXIS
500.0000 mg | Freq: Once | TOPICAL | Status: DC
  Filled 2022-12-28 (×2): qty 10

## 2022-12-28 MED ORDER — HYDRALAZINE HCL 20 MG/ML IJ SOLN: 20.0000 mg | Freq: Four times a day (QID) | INTRAMUSCULAR | Status: DC | PRN

## 2022-12-28 MED ORDER — ONDANSETRON HCL 4 MG PO TABS: 4.0000 mg | ORAL_TABLET | Freq: Four times a day (QID) | ORAL | Status: DC | PRN

## 2022-12-28 MED ORDER — EMPAGLIFLOZIN 10 MG PO TABS
10.0000 mg | ORAL_TABLET | Freq: Once | ORAL | Status: AC
  Administered 2022-12-28: 10 mg via ORAL
  Filled 2022-12-28: qty 1

## 2022-12-28 MED ORDER — ACETAMINOPHEN 325 MG PO TABS: 650.0000 mg | ORAL_TABLET | Freq: Four times a day (QID) | ORAL | Status: DC | PRN

## 2022-12-28 MED ORDER — METOPROLOL SUCCINATE ER 25 MG PO TB24
12.5000 mg | ORAL_TABLET | Freq: Every day | ORAL | Status: DC
  Administered 2022-12-28: 12.5 mg via ORAL
  Filled 2022-12-28: qty 1

## 2022-12-28 MED ORDER — EMPAGLIFLOZIN 10 MG PO TABS
10.0000 mg | ORAL_TABLET | Freq: Every day | ORAL | Status: DC
  Filled 2022-12-28 (×2): qty 1

## 2022-12-28 MED ORDER — FUROSEMIDE 40 MG PO TABS
40.0000 mg | ORAL_TABLET | Freq: Every day | ORAL | Status: DC
  Filled 2022-12-28: qty 2

## 2022-12-28 MED ORDER — FUROSEMIDE 20 MG PO TABS: 40.0000 mg | ORAL_TABLET | Freq: Two times a day (BID) | ORAL | Status: DC

## 2022-12-28 MED ORDER — SACUBITRIL-VALSARTAN 49-51 MG PO TABS
1.0000 | ORAL_TABLET | Freq: Once | ORAL | Status: AC
  Administered 2022-12-28: 1 via ORAL
  Filled 2022-12-28: qty 1

## 2022-12-28 MED ORDER — METOPROLOL SUCCINATE ER 25 MG PO TB24
12.5000 mg | ORAL_TABLET | Freq: Every day | ORAL | Status: DC
  Filled 2022-12-28: qty 1

## 2022-12-28 MED ORDER — OXYMETAZOLINE HCL 0.05 % NA SOLN
2.0000 | Freq: Once | NASAL | Status: AC
  Administered 2022-12-28: 2 via NASAL
  Filled 2022-12-28: qty 30

## 2022-12-28 MED ORDER — POTASSIUM CHLORIDE CRYS ER 20 MEQ PO TBCR
40.0000 meq | EXTENDED_RELEASE_TABLET | Freq: Once | ORAL | Status: AC
  Administered 2022-12-28: 40 meq via ORAL
  Filled 2022-12-28: qty 2

## 2022-12-28 MED ORDER — FUROSEMIDE 40 MG PO TABS
40.0000 mg | ORAL_TABLET | Freq: Once | ORAL | Status: AC
  Administered 2022-12-28: 40 mg via ORAL
  Filled 2022-12-28: qty 1

## 2022-12-28 NOTE — ED Notes (Signed)
Transfer of care report given to yellow zone RN.

## 2022-12-28 NOTE — Discharge Instructions (Addendum)
   Go straight to the [facility] emergency department and let them know you were transferred from the [facility] emergency department to be evaluated and seen by the ENT doctor in the ER.      Your rhino rocket (nasal packing) must be removed by June 6th, 2024. It should not remain in any longer than 5 days.  Call [phone] TODAY to make your appointment with the ear nose and throat specialist to have the packing removed and your nosebleed evaluated.  It is extremely important you take ALL of your medications as prescribed including all of your blood pressure medications.  However, at this time hold your Eliquis and do not take for the next 1-2 days. Call your cardiologist to make sure this is appropriate.  Come back if any severe chest pain, shortness of breath, fainting, bleeding profusely, difficulty breathing, or any other symptoms concerning to you.

## 2022-12-28 NOTE — ED Notes (Signed)
Pt reporting SOB without CP. SaO2 100% on room air. Pulse 25-35 and HR 115-130.

## 2022-12-28 NOTE — ED Notes (Signed)
MD is aware of pt BP and is ok with d/c. Pt asymptomatic at this time

## 2022-12-28 NOTE — ED Notes (Addendum)
Pt placed on bedpan

## 2022-12-28 NOTE — ED Notes (Addendum)
Provider at bedside. NAD noted and left nostril bleeding controlled.

## 2022-12-28 NOTE — ED Provider Notes (Signed)
[name] EMERGENCY DEPARTMENT AT [facility] Provider Note   CSN: [id number] Arrival date & time: 12/28/22  1026     History  Chief Complaint  Patient presents with   Epistaxis    Riley Jefferson is a 62 y.o. male.  HPI 62 year old male history of A-fib, on Eliquis, hypertension, presents today in transfer from [facility] due to nosebleed.  Patient was seen yesterday morning for nosebleed.  Reports on his first visit the bleeding resolved.  He stopped his Eliquis and has not taken it since yesterday morning.  He then began having worsening bleeding and represented.  Time he had a Rhino Rocket placed.  He went home from the ED and really presented today to [facility].  He was transferred here to [facility] for Dr. Jearld Fenton to see.  Dr. Jearld Fenton has seen and placed some packing above and below was going to place more packing but the patient refused.  He has remained very hypertensive.     Home Medications Prior to Admission medications   Medication Sig Start Date End Date Taking? Authorizing Provider  acetaminophen (TYLENOL) 325 MG tablet Take 2 tablets (650 mg total) by mouth every 6 (six) hours as needed for mild pain (or Fever >/= 101). 06/10/22   Marguerita Merles Latif, DO  apixaban (ELIQUIS) 5 MG TABS tablet Take 1 tablet (5 mg total) by mouth 2 (two) times daily. 06/16/22   Swinyer, Zachary George, NP  furosemide (LASIX) 40 MG tablet Take 1 tablet (40 mg total) by mouth daily. Patient taking differently: Take 40 mg by mouth 2 (two) times daily. 10/14/22 10/14/23  Swinyer, Zachary George, NP  latanoprost (XALATAN) 0.005 % ophthalmic solution Place 1 drop into both eyes at bedtime. 12/19/22   [provider]  metoprolol succinate (TOPROL-XL) 25 MG 24 hr tablet Take 0.5 tablets (12.5 mg total) by mouth daily. 11/02/22   Swinyer, Zachary George, NP      Allergies    Patient has no known allergies.    Review of Systems   Review of Systems  Physical Exam Updated Vital Signs BP (!)  172/117 (BP Location: Right Arm)   Pulse 66   Temp 97.8 F (36.6 C) (Oral)   Resp 16   SpO2 100%  Physical Exam Vitals reviewed.  Constitutional:      Appearance: He is obese.  HENT:     Head: Normocephalic.     Right Ear: External ear normal.     Left Ear: External ear normal.     Nose:     Comments: Packing left nares with some oozing around it    Mouth/Throat:     Mouth: Mucous membranes are moist.  Eyes:     Pupils: Pupils are equal, round, and reactive to light.  Cardiovascular:     Rate and Rhythm: Normal rate. Rhythm irregular.     Pulses: Normal pulses.  Pulmonary:     Effort: Pulmonary effort is normal.  Abdominal:     Palpations: Abdomen is soft.  Musculoskeletal:     Cervical back: Normal range of motion.  Skin:    General: Skin is warm.     Comments: Some dried blood noted  Neurological:     General: No focal deficit present.     Mental Status: He is alert.  Psychiatric:        Mood and Affect: Mood normal.     ED Results / Procedures / Treatments   Labs (all labs ordered are listed, but  only abnormal results are displayed) Labs Reviewed  HEMOGLOBIN AND HEMATOCRIT, BLOOD  HEMOGLOBIN AND HEMATOCRIT, BLOOD    EKG None  Radiology No results found.  Procedures Procedures    Medications Ordered in ED Medications  hydrALAZINE (APRESOLINE) injection 20 mg (has no administration in time range)  hydrochlorothiazide (HYDRODIURIL) tablet 12.5 mg (has no administration in time range)  lisinopril (ZESTRIL) tablet 10 mg (has no administration in time range)  hydrALAZINE (APRESOLINE) injection 20 mg (has no administration in time range)    ED Course/ Medical Decision Making/ A&P                             Medical Decision Making Risk Prescription drug management.   Patient with recurrent nosebleed, on chronic anticoagulation which she has held for 36 hours.  She sent to the [facility], ED for evaluation by ENT.  He was partially packed by Dr.  Jearld Fenton.  He refused further intervention at this time.  He is quite hypertensive. Dr. Jearld Fenton has requested that we obtain medical admission with blood pressure control.  Continue to hold Eliquis.  He is available if there is any rebleeding. Discussed with Dr. Chipper Herb who will see for admission       Final Clinical Impression(s) / ED Diagnoses Final diagnoses:  Epistaxis  Hypertension, unspecified type  Chronic anticoagulation    Rx / DC Orders ED Discharge Orders     None         Margarita Grizzle, MD 12/28/22 1221

## 2022-12-28 NOTE — ED Notes (Signed)
Pt requested to stay ob bedpan.

## 2022-12-28 NOTE — ED Notes (Signed)
Encouraged pt to take lisinopril, pt states he no longer takes this medication and is requesting metoprolol.

## 2022-12-28 NOTE — ED Notes (Signed)
IV team to bedside. 

## 2022-12-28 NOTE — ED Provider Notes (Signed)
.  Epistaxis Management  Date/Time: 12/28/2022 3:09 PM  Performed by: Glyn Ade, MD Authorized by: Glyn Ade, MD   Consent:    Consent obtained:  Verbal   Risks discussed:  Bleeding, infection, nasal injury and pain   Alternatives discussed:  No treatment, alternative treatment, delayed treatment, observation and referral Anesthesia:    Anesthesia method:  None Procedure details:    Treatment site:  L anterior   Treatment method:  Nasal balloon   Treatment episode: recurring   Post-procedure details:    Assessment:  Bleeding stopped   Procedure completion:  Ellender Hose, MD 12/28/22 1510

## 2022-12-28 NOTE — ED Provider Notes (Signed)
[place] EMERGENCY DEPARTMENT AT [facility] Provider Note   CSN: [id number] Arrival date & time: 12/28/22  4098     History  Chief Complaint  Patient presents with   Epistaxis    Riley Jefferson is a 62 y.o. male. With pmh chronic atrial fibrillation on chronic anticoagulation, HFrEF, HTN presenting with continued oozing from left nare after rhino rocket placed yesterday in ER.  Patient says he woke up this morning he slept comfortably last night but noticed some blood oozing from his left nare.  He wiped it off with tissue.  There were no large clots.  He is not choking on blood or feeling like a lot of blood is going down the back of his throat.  He denies any chest pain, shortness of breath, lightheadedness, syncopal episodes.  He has had no traumatic injury. Denies HA or facial pain. He has not taken his Eliquis in 2 days.  He has not taken any of his blood pressure medications in the past 2 days.  Recently seen by cardiology recommended to restart Jardiance and [facility] which he has not restarted yet.   Epistaxis      Home Medications Prior to Admission medications   Medication Sig Start Date End Date Taking? Authorizing Provider  acetaminophen (TYLENOL) 325 MG tablet Take 2 tablets (650 mg total) by mouth every 6 (six) hours as needed for mild pain (or Fever >/= 101). 06/10/22   Marguerita Merles Latif, DO  apixaban (ELIQUIS) 5 MG TABS tablet Take 1 tablet (5 mg total) by mouth 2 (two) times daily. 06/16/22   Swinyer, Zachary George, NP  furosemide (LASIX) 40 MG tablet Take 1 tablet (40 mg total) by mouth daily. Patient taking differently: Take 40 mg by mouth 2 (two) times daily. 10/14/22 10/14/23  Swinyer, Zachary George, NP  latanoprost (XALATAN) 0.005 % ophthalmic solution Place 1 drop into both eyes at bedtime. 12/19/22   [provider]  metoprolol succinate (TOPROL-XL) 25 MG 24 hr tablet Take 0.5 tablets (12.5 mg total) by mouth daily. 11/02/22   Swinyer, Zachary George, NP       Allergies    Patient has no known allergies.    Review of Systems   Review of Systems  HENT:  Positive for nosebleeds.     Physical Exam Updated Vital Signs BP (!) 179/115 (BP Location: Right Arm)   Pulse 82   Temp 97.6 F (36.4 C) (Oral)   Resp 18   Ht 6\' 4"  (1.93 m)   Wt 134.7 kg   SpO2 98%   BMI 36.15 kg/m  Physical Exam Constitutional: Alert and oriented. Well appearing and in no distress. Eyes: Conjunctivae are normal. ENT      Head: Normocephalic and atraumatic.      Nose: Rhino Rocket in place in left nare with no active oozing or hemorrhage.  There is some small amount of new blood at anterior nare.  No blood in posterior oropharynx.  No blood from right nare. Cardiovascular: Regular rate, warm well-perfused, equal palpable radial pulses Respiratory: Normal respiratory effort. Breath sounds are normal.  O2 sat 98 on RA Gastrointestinal: Nondistended Musculoskeletal: Normal range of motion in all extremities.      Right lower leg: No tenderness or edema.      Left lower leg: No tenderness or edema. Neurologic: Normal speech and language.  Moving all 4 extremities equally.  Sensation grossly intact.  No facial droop.  No gross focal neurologic deficits are appreciated. Skin: Skin is  warm, dry and intact. No rash noted. Psychiatric: Mood and affect are normal. Speech and behavior are normal.  ED Results / Procedures / Treatments   Labs (all labs ordered are listed, but only abnormal results are displayed) Labs Reviewed  BASIC METABOLIC PANEL - Abnormal; Notable for the following components:      Result Value   Potassium 3.3 (*)    Glucose, Bld 106 (*)    All other components within normal limits  CBC WITH DIFFERENTIAL/PLATELET    EKG EKG Interpretation  Date/Time:  Monday December 28 2022 07:27:45 EDT Ventricular Rate:  58 PR Interval:    QRS Duration: 118 QT Interval:  423 QTC Calculation: 416 R Axis:   -18 Text Interpretation: Atrial fibrillation  Left ventricular hypertrophy Anterior infarct, old Borderline T abnormalities, inferior leads No significant change since last tracing Confirmed by Vivien Rossetti ([id number]) on 12/28/2022 7:33:15 AM  Radiology No results found.  Procedures Procedures    Medications Ordered in ED Medications  metoprolol succinate (TOPROL-XL) 24 hr tablet 12.5 mg (12.5 mg Oral Given 12/28/22 0817)  furosemide (LASIX) tablet 40 mg (40 mg Oral Given 12/28/22 0818)  empagliflozin (JARDIANCE) tablet 10 mg (10 mg Oral Given 12/28/22 0818)  sacubitril-valsartan (ENTRESTO) 49-51 mg per tablet (1 tablet Oral Given 12/28/22 0818)    ED Course/ Medical Decision Making/ A&P                             Medical Decision Making Riley Jefferson is a 62 y.o. male. With pmh chronic atrial fibrillation on chronic anticoagulation, HFrEF, HTN presenting with continued oozing from left nare after rhino rocket placed yesterday in ER.    He has not taken Eliquis in 2 days.  He has no symptoms suggestive of symptomatic anemia.  Of note, is hypertensive but asymptomatic.  Has not taken blood pressure medications in the past 2 days.  He has also not picked up his Jardiance or Entresto recommended by cardiology end of May.  Patient's Rhino Rocket is in place with no active hemorrhage or clots passing.  There is no right-sided bleed or posterior bleeding.  I suspect this is consistent with anterior epistaxis.  I hyperinflated wound with an extra 2 to 3 cc air. Will give home BP meds.  Labs obtained and unremarkable.  Normal hemoglobin 14.8 and platelet count 236 not concern for coagulopathy or need for transfusion.  Creatinine 0.82 within normal limits.  Mild hypokalemia 3.3.  Dr Jearld Fenton ENT recommends patient call phone number for outpatient scheduling of follow-up. Rhino Rocket cannot remain in any longer than 5 days which I did discuss with patient.  Patient observed for approximately 2 hours with no drainage around [employer].  No clots  passed.  Maintaining airway.  Given home blood pressure medications discussed importance of follow-up with ENT for Rhino Rocket removal/epi taxis evaluation and picking up home Entresto and Jardiance and resuming home blood pressure medications otherwise will have increased risk of heart attack and stroke which I did discuss with patient.  9:26 AM ADDENDUM:  As patient was walking out of the room started having drainage and bleeding with slow dripping and blood coming out of left nare.  He also had some mild drainage from right nare although no obvious bleeding source on exam.  Given nasal Afrin left Rhino Rocket in place.  Spoke with Dr. Jearld Fenton of ENT who request to see patient in the [facility], ED.  Will  do ED to ED.  Accepted by Dr. Wallace Cullens.  Amount and/or Complexity of Data Reviewed Labs: ordered.  Risk OTC drugs. Prescription drug management.      Final Clinical Impression(s) / ED Diagnoses Final diagnoses:  Left-sided epistaxis  Hypertension, unspecified type    Rx / DC Orders ED Discharge Orders     None         Mardene Sayer, MD 12/28/22 [phone]

## 2022-12-28 NOTE — ED Notes (Signed)
Attempted IV x 2, unable to obtain at this time. IV team order placed.

## 2022-12-28 NOTE — ED Notes (Signed)
ED TO INPATIENT HANDOFF REPORT  ED Nurse Name and Phone #: Nicholos Johns [phone]  J Name/Age/Gender Riley Jefferson 62 y.o. male Room/Bed: 045C/045C  Code Status   Code Status: Full Code  Home/SNF/Other Home Patient oriented to: self, place, time, and situation Is this baseline? Yes   Triage Complete: Triage complete  Chief Complaint Bleeding from the nose [R04.0]  Triage Note Pt arrives from [facility] to see Dr. Jearld Fenton for further eval of bleeding from L nostril. Rhino rocket in place from yesterday. Has not taken Eliquis in 2 days.    Allergies No Known Allergies  Level of Care/Admitting Diagnosis ED Disposition     ED Disposition  Admit   Condition  --   Comment  Hospital Area: [facility] [[id number]]  Level of Care: Med-Surg [16]  May place patient in observation at [facility] or [facility] if equivalent level of care is available:: No  Covid Evaluation: Asymptomatic - no recent exposure (last 10 days) testing not required  Diagnosis: Bleeding from the nose [191478]  Admitting Physician: Emeline General [[id number]]  Attending Physician: Emeline General [[id number]]          B Medical/Surgery History Past Medical History:  Diagnosis Date   Atrial fibrillation (HCC)    Hypertension    No past surgical history on file.   A IV Location/Drains/Wounds Patient Lines/Drains/Airways Status     Active Line/Drains/Airways     Name Placement date Placement time Site Days   Peripheral IV 12/28/22 20 G 1.88" Anterior;Proximal;Right Forearm 12/28/22  1255  Forearm  less than 1            Intake/Output Last 24 hours No intake or output data in the 24 hours ending 12/28/22 1406  Labs/Imaging Results for orders placed or performed during the hospital encounter of 12/28/22 (from the past 48 hour(s))  Basic metabolic panel     Status: Abnormal   Collection Time: 12/28/22  7:40 AM  Result Value Ref Range   Sodium 137 135 - 145 mmol/L   Potassium 3.3 (L) 3.5 -  5.1 mmol/L   Chloride 106 98 - 111 mmol/L   CO2 25 22 - 32 mmol/L   Glucose, Bld 106 (H) 70 - 99 mg/dL    Comment: Glucose reference range applies only to samples taken after fasting for at least 8 hours.   BUN 14 8 - 23 mg/dL   Creatinine, Ser 6.96 0.61 - 1.24 mg/dL   Calcium 9.1 8.9 - 29.5 mg/dL   GFR, Estimated >28 >41 mL/min    Comment: (NOTE) Calculated using the CKD-EPI Creatinine Equation (2021)    Anion gap 6 5 - 15    Comment: Performed at [facility], [address], [place], [place] [address]  CBC with Differential     Status: None   Collection Time: 12/28/22  7:40 AM  Result Value Ref Range   WBC 5.2 4.0 - 10.5 K/uL   RBC 5.38 4.22 - 5.81 MIL/uL   Hemoglobin 14.8 13.0 - 17.0 g/dL   HCT 10.2 72.5 - 36.6 %   MCV 83.3 80.0 - 100.0 fL   MCH 27.5 26.0 - 34.0 pg   MCHC 33.0 30.0 - 36.0 g/dL   RDW 44.0 34.7 - 42.5 %   Platelets 236 150 - 400 K/uL   nRBC 0.0 0.0 - 0.2 %   Neutrophils Relative % 43 %   Neutro Abs 2.3 1.7 - 7.7 K/uL   Lymphocytes Relative 41 %  Lymphs Abs 2.1 0.7 - 4.0 K/uL   Monocytes Relative 11 %   Monocytes Absolute 0.6 0.1 - 1.0 K/uL   Eosinophils Relative 4 %   Eosinophils Absolute 0.2 0.0 - 0.5 K/uL   Basophils Relative 1 %   Basophils Absolute 0.1 0.0 - 0.1 K/uL   Immature Granulocytes 0 %   Abs Immature Granulocytes 0.01 0.00 - 0.07 K/uL    Comment: Performed at [facility], [address], [place], [place] [address]   No results found.  Pending Labs Unresulted Labs (From admission, onward)     Start     Ordered   12/29/22 0500  CBC  Tomorrow morning,   R        12/28/22 1214   12/28/22 2000  Hemoglobin and hematocrit, blood  Once-Timed,   STAT        12/28/22 1214   12/28/22 1400  Hemoglobin and hematocrit, blood  Once-Timed,   STAT        12/28/22 1214            Vitals/Pain Today's Vitals   12/28/22 1309 12/28/22 1312 12/28/22 1315 12/28/22 1330  BP:   (!) 137/91 118/75  Pulse: (!)  40 (!) 25 (!) 110 76  Resp: (!) 28 (!) 29 (!) 31 16  Temp:      TempSrc:      SpO2: 100% 100% 100% 100%  PainSc:        Isolation Precautions No active isolations  Medications Medications  lisinopril (ZESTRIL) tablet 10 mg (10 mg Oral Patient Refused/Not Given 12/28/22 1240)  hydrALAZINE (APRESOLINE) injection 20 mg (has no administration in time range)  acetaminophen (TYLENOL) tablet 650 mg (has no administration in time range)  metoprolol succinate (TOPROL-XL) 24 hr tablet 12.5 mg (0 mg Oral Hold 12/28/22 1254)  latanoprost (XALATAN) 0.005 % ophthalmic solution 1 drop (has no administration in time range)  ondansetron (ZOFRAN) tablet 4 mg (has no administration in time range)    Or  ondansetron (ZOFRAN) injection 4 mg (has no administration in time range)  LORazepam (ATIVAN) tablet 0.5 mg (has no administration in time range)  furosemide (LASIX) tablet 40 mg (40 mg Oral Not Given 12/28/22 1258)  potassium chloride SA (KLOR-CON M) CR tablet 40 mEq (has no administration in time range)  empagliflozin (JARDIANCE) tablet 10 mg (has no administration in time range)  hydrALAZINE (APRESOLINE) injection 20 mg (20 mg Intravenous Given 12/28/22 1254)    Mobility walks     Focused Assessments Cardiac Assessment Handoff:    No results found for: "CKTOTAL", "CKMB", "CKMBINDEX", "TROPONINI" No results found for: "DDIMER" Does the Patient currently have chest pain? No    R Recommendations: See Admitting Provider Note  Report given to:   Additional Notes:    Symptoms started yesterday morning patient woke up with severe bleed from left nostril.  Nosebleed and went to [facility] ED for the first time.  Denies any trauma no headache no fever or chills no chest pain or shortness of breath.  At [facility] long ED, patient was monitored for the morning and ED evaluation showed anterior nosebleed has stopped and no active bleeding patient was monitored for few hours and then discharged  home.  However yesterday evening patient came back to ED the second time for recurrent left nostril bleeding, this time patient was treated with packing and Rhino Rocket and sent home.  This morning however again, patient found the left nostril bleeding resume and the  bleeding around the [facility] side and came to ED again this time patient does feel lightheadedness but denies any chest pain shortness of breath.  Patient reported last dose of Eliquis was 2 days ago.   ED Course: Significant elevated with SBP> 200.  Patient appears to be very anxious.  ENT recommended no acute intervention needed but recommend patient get better blood pressure control.  Hemoglobin 14.5 about his baseline.

## 2022-12-28 NOTE — ED Triage Notes (Signed)
Pt arrives c/o epistaxis from L nostril that started this AM. Was seen at [facility] yesterday for same thing and had nasal packing placed at that time - remains in place. Pt on eliquis for A fib. Denies n/v, dizziness. States that he is feeling lightheaded when asked. Pt hypertensive in triage - has not taken BP meds yet this AM.

## 2022-12-28 NOTE — ED Triage Notes (Signed)
Pt arrives from [facility] to see Dr. Jearld Fenton for further eval of bleeding from L nostril. Rhino rocket in place from yesterday. Has not taken Eliquis in 2 days.

## 2022-12-28 NOTE — Consult Note (Signed)
Reason for Consult:epistaxis Referring Physician: dr ray  Riley Jefferson is an 62 y.o. male.  HPI: hx of epistaxis since few days. He has not taken eloquis for 2 days. He has increase BP and had significant saly load few days ago. He is convinced it is his BP causing nosebleed. He had rhinorocket placed and still bleeding from left side  Past Medical History:  Diagnosis Date   Atrial fibrillation (HCC)    Hypertension     No past surgical history on file.  Family History  Problem Relation Age of Onset   Stroke Father     Social History:  reports that he quit smoking about 21 years ago. His smoking use included cigarettes. He has a 20.00 pack-year smoking history. He has never used smokeless tobacco. He reports that he does not drink alcohol and does not use drugs.  Allergies: No Known Allergies  Medications: I have reviewed the patient's current medications.  Results for orders placed or performed during the hospital encounter of 12/28/22 (from the past 48 hour(s))  Basic metabolic panel     Status: Abnormal   Collection Time: 12/28/22  7:40 AM  Result Value Ref Range   Sodium 137 135 - 145 mmol/L   Potassium 3.3 (L) 3.5 - 5.1 mmol/L   Chloride 106 98 - 111 mmol/L   CO2 25 22 - 32 mmol/L   Glucose, Bld 106 (H) 70 - 99 mg/dL    Comment: Glucose reference range applies only to samples taken after fasting for at least 8 hours.   BUN 14 8 - 23 mg/dL   Creatinine, Ser 6.21 0.61 - 1.24 mg/dL   Calcium 9.1 8.9 - 30.8 mg/dL   GFR, Estimated >65 >78 mL/min    Comment: (NOTE) Calculated using the CKD-EPI Creatinine Equation (2021)    Anion gap 6 5 - 15    Comment: Performed at [facility], [address] [place], [place] [address]  CBC with Differential     Status: None   Collection Time: 12/28/22  7:40 AM  Result Value Ref Range   WBC 5.2 4.0 - 10.5 K/uL   RBC 5.38 4.22 - 5.81 MIL/uL   Hemoglobin 14.8 13.0 - 17.0 g/dL   HCT 95.2 84.1 - 32.4 %   MCV 83.3  80.0 - 100.0 fL   MCH 27.5 26.0 - 34.0 pg   MCHC 33.0 30.0 - 36.0 g/dL   RDW 40.1 02.7 - 25.3 %   Platelets 236 150 - 400 K/uL   nRBC 0.0 0.0 - 0.2 %   Neutrophils Relative % 43 %   Neutro Abs 2.3 1.7 - 7.7 K/uL   Lymphocytes Relative 41 %   Lymphs Abs 2.1 0.7 - 4.0 K/uL   Monocytes Relative 11 %   Monocytes Absolute 0.6 0.1 - 1.0 K/uL   Eosinophils Relative 4 %   Eosinophils Absolute 0.2 0.0 - 0.5 K/uL   Basophils Relative 1 %   Basophils Absolute 0.1 0.0 - 0.1 K/uL   Immature Granulocytes 0 %   Abs Immature Granulocytes 0.01 0.00 - 0.07 K/uL    Comment: Performed at [facility], [address] [place], [place] [address]    No results found.  ROS Blood pressure (!) 149/92, pulse 66, temperature 97.8 F (36.6 C), temperature source Oral, resp. rate 16, SpO2 100 %. Physical Exam HENT:     Head: Normocephalic.     Nose:     Comments: Pack in the left side  that was removed. The nose had no active bleeding. He had lidocaine afrin pledgets placed and no bleeding site seen. No further blood per sputum. Surgicel with bactracin was placed in the superior and inferior nose. He would not allow me to complete the packing with a merocel into the nose.    Mouth/Throat:     Mouth: Mucous membranes are moist.     Comments: No bleeding Eyes:     Pupils: Pupils are equal, round, and reactive to light.  Musculoskeletal:     Cervical back: Normal range of motion.  Neurological:     Mental Status: He is alert.       Assessment/Plan: Epistaxis- he has surgicel in the superior and inferior nose and he would not allow me to finish the packing with a merocel into the left side. He has increased BP very high and control of that would be very helpful. He will get medical consult for the BP and possible admission for observation. He has dissolvable packing right now and should get saline spray BID to left side. I will leave the eloquis restart to the medical team. He can  follow up as needed and if further bleeding call me to put the merocel pack in.   Suzanna Obey 12/28/2022, 11:32 AM

## 2022-12-28 NOTE — H&P (Signed)
History and Physical    Riley Jefferson ZOX:[id number] DOB: [birth year] DOA: 12/28/2022  PCP: System, Provider Not In (Confirm with patient/family/NH records and if not entered, this has to be entered at [facility] point of entry) Patient coming from: Home  I have personally briefly reviewed patient's old medical records in [facility] Link  Chief Complaint: Nose bleeding  HPI: Riley Jefferson is a 62 y.o. male with medical history significant of PAF on Eliquis, HTN, chronic HFrEF LVEF 40-45%, presented with recurrent nosebleed.  Symptoms started yesterday morning patient woke up with severe bleed from left nostril.  Nosebleed and went to [facility] ED for the first time.  Denies any trauma no headache no fever or chills no chest pain or shortness of breath.  At [facility] long ED, patient was monitored for the morning and ED evaluation showed anterior nosebleed has stopped and no active bleeding patient was monitored for few hours and then discharged home.  However yesterday evening patient came back to ED the second time for recurrent left nostril bleeding, this time patient was treated with packing and Rhino Rocket and sent home.  This morning however again, patient found the left nostril bleeding resume and the bleeding around the Rhino Rocket side and came to ED again this time patient does feel lightheadedness but denies any chest pain shortness of breath.  Patient reported last dose of Eliquis was 2 days ago.  ED Course: Significant elevated with SBP> 200.  Patient appears to be very anxious.  ENT recommended no acute intervention needed but recommend patient get better blood pressure control.  Hemoglobin 14.5 about his baseline.  Review of Systems: As per HPI otherwise 14 point review of systems negative.    Past Medical History:  Diagnosis Date   Atrial fibrillation (HCC)    Hypertension     No past surgical history on file.   reports that he quit smoking about 21 years ago. His  smoking use included cigarettes. He has a 20.00 pack-year smoking history. He has never used smokeless tobacco. He reports that he does not drink alcohol and does not use drugs.  No Known Allergies  Family History  Problem Relation Age of Onset   Stroke Father      Prior to Admission medications   Medication Sig Start Date End Date Taking? Authorizing Provider  acetaminophen (TYLENOL) 325 MG tablet Take 2 tablets (650 mg total) by mouth every 6 (six) hours as needed for mild pain (or Fever >/= 101). 06/10/22   Marguerita Merles Latif, DO  apixaban (ELIQUIS) 5 MG TABS tablet Take 1 tablet (5 mg total) by mouth 2 (two) times daily. 06/16/22   Swinyer, Zachary George, NP  furosemide (LASIX) 40 MG tablet Take 1 tablet (40 mg total) by mouth daily. Patient taking differently: Take 40 mg by mouth 2 (two) times daily. 10/14/22 10/14/23  Swinyer, Zachary George, NP  latanoprost (XALATAN) 0.005 % ophthalmic solution Place 1 drop into both eyes at bedtime. 12/19/22   [provider]  metoprolol succinate (TOPROL-XL) 25 MG 24 hr tablet Take 0.5 tablets (12.5 mg total) by mouth daily. 11/02/22   Levi Aland, NP    Physical Exam: Vitals:   12/28/22 1038 12/28/22 1135 12/28/22 1240  BP: (!) 149/92 (!) 172/117 (!) 161/91  Pulse: 66  83  Resp: 16  16  Temp: 97.8 F (36.6 C)    TempSrc: Oral    SpO2: 100%  99%    Constitutional: NAD, calm, comfortable Vitals:  12/28/22 1038 12/28/22 1135 12/28/22 1240  BP: (!) 149/92 (!) 172/117 (!) 161/91  Pulse: 66  83  Resp: 16  16  Temp: 97.8 F (36.6 C)    TempSrc: Oral    SpO2: 100%  99%   Eyes: PERRL, lids and conjunctivae normal ENMT: Mucous membranes are moist. Posterior pharynx clear of any exudate or lesions.Normal dentition.  Neck: normal, supple, no masses, no thyromegaly Respiratory: clear to auscultation bilaterally, no wheezing, no crackles. Normal respiratory effort. No accessory muscle use.  Cardiovascular: Regular rate and rhythm, no  murmurs / rubs / gallops. No extremity edema. 2+ pedal pulses. No carotid bruits.  Abdomen: no tenderness, no masses palpated. No hepatosplenomegaly. Bowel sounds positive.  Musculoskeletal: no clubbing / cyanosis. No joint deformity upper and lower extremities. Good ROM, no contractures. Normal muscle tone.  Skin: no rashes, lesions, ulcers. No induration Neurologic: CN 2-12 grossly intact. Sensation intact, DTR normal. Strength 5/5 in all 4.  Psychiatric: Normal judgment and insight. Alert and oriented x 3. Normal mood.    Labs on Admission: I have personally reviewed following labs and imaging studies  CBC: Recent Labs  Lab 12/27/22 2028 12/28/22 0740  WBC 6.7 5.2  NEUTROABS  --  2.3  HGB 14.7 14.8  HCT 44.9 44.8  MCV 83.5 83.3  PLT 242 236   Basic Metabolic Panel: Recent Labs  Lab 12/27/22 2028 12/28/22 0740  NA 138 137  K 3.4* 3.3*  CL 102 106  CO2 23 25  GLUCOSE 98 106*  BUN 14 14  CREATININE 1.01 0.82  CALCIUM 9.5 9.1   GFR: Estimated Creatinine Clearance: 136.4 mL/min (by C-G formula based on SCr of 0.82 mg/dL). Liver Function Tests: Recent Labs  Lab 12/27/22 2028  AST 24  ALT 21  ALKPHOS 88  BILITOT 0.6  PROT 7.3  ALBUMIN 3.5   No results for input(s): "LIPASE", "AMYLASE" in the last 168 hours. No results for input(s): "AMMONIA" in the last 168 hours. Coagulation Profile: No results for input(s): "INR", "PROTIME" in the last 168 hours. Cardiac Enzymes: No results for input(s): "CKTOTAL", "CKMB", "CKMBINDEX", "TROPONINI" in the last 168 hours. BNP (last 3 results) No results for input(s): "PROBNP" in the last 8760 hours. HbA1C: No results for input(s): "HGBA1C" in the last 72 hours. CBG: No results for input(s): "GLUCAP" in the last 168 hours. Lipid Profile: No results for input(s): "CHOL", "HDL", "LDLCALC", "TRIG", "CHOLHDL", "LDLDIRECT" in the last 72 hours. Thyroid Function Tests: No results for input(s): "TSH", "T4TOTAL", "FREET4", "T3FREE",  "THYROIDAB" in the last 72 hours. Anemia Panel: No results for input(s): "VITAMINB12", "FOLATE", "FERRITIN", "TIBC", "IRON", "RETICCTPCT" in the last 72 hours. Urine analysis: No results found for: "COLORURINE", "APPEARANCEUR", "LABSPEC", "PHURINE", "GLUCOSEU", "HGBUR", "BILIRUBINUR", "KETONESUR", "PROTEINUR", "UROBILINOGEN", "NITRITE", "LEUKOCYTESUR"  Radiological Exams on Admission: No results found.  EKG: Independently reviewed.  Rate controlled A-fib, no acute ST changes.  Assessment/Plan Principal Problem:   Bleeding from the nose Active Problems:   Atrial fibrillation (HCC)   Epistaxis  (please populate well all problems here in Problem List. (For example, if patient is on BP meds at home and you resume or decide to hold them, it is a problem that needs to be her. Same for CAD, COPD, HLD and so on)  Recurrent epistaxis -Left nostril, status post nasal packing and Rhino Rocket -Currently, patient reported decreased post nasal bleed, which indicate slowing down bleeding process.  Continue to monitor clinically and check H&H this afternoon and tonight..  Continue to hold  Eliquis. -ENT consultation appreciated.  HTN, uncontrolled -Probably related to acute anxiety, ordered as needed p.o. Ativan -Continue Lasix 41 daily, add lisinopril 10 mg daily -Continue metoprolol -Add as needed hydralazine  Hypokalemia -P.o. replacement  Chronic HFrEF -Clinically appears to be euvolemic, continue daily p.o. Lasix and metoprolol -Continue Jardiance  Chronic A-fib -Rate controlled, denies any palpitations, continue to hold Eliquis.  DVT prophylaxis: SCD Code Status: Full code Family Communication: None at bedside Disposition Plan: Expect less than 2 midnight hospital stay Consults called: ENT Admission status: Medsurg obs   Emeline General MD [facility] Pager [phone]  12/28/2022, 12:52 PM

## 2022-12-28 NOTE — ED Notes (Signed)
ENT provider at bedside.  

## 2022-12-29 ENCOUNTER — Telehealth: Payer: Self-pay

## 2022-12-29 ENCOUNTER — Other Ambulatory Visit (HOSPITAL_COMMUNITY): Payer: Self-pay

## 2022-12-29 ENCOUNTER — Observation Stay (HOSPITAL_COMMUNITY): Payer: Medicaid Other

## 2022-12-29 DIAGNOSIS — R040 Epistaxis: Secondary | ICD-10-CM | POA: Diagnosis not present

## 2022-12-29 LAB — CBC
HCT: 47.2 % (ref 39.0–52.0)
Hemoglobin: 16.1 g/dL (ref 13.0–17.0)
MCH: 27.3 pg (ref 26.0–34.0)
MCHC: 34.1 g/dL (ref 30.0–36.0)
MCV: 80 fL (ref 80.0–100.0)
Platelets: 260 10*3/uL (ref 150–400)
RBC: 5.9 MIL/uL — ABNORMAL HIGH (ref 4.22–5.81)
RDW: 15.4 % (ref 11.5–15.5)
WBC: 9 10*3/uL (ref 4.0–10.5)
nRBC: 0 % (ref 0.0–0.2)

## 2022-12-29 LAB — GLUCOSE, CAPILLARY: Glucose-Capillary: 139 mg/dL — ABNORMAL HIGH (ref 70–99)

## 2022-12-29 MED ORDER — METOPROLOL SUCCINATE ER 25 MG PO TB24: 25.0000 mg | ORAL_TABLET | Freq: Every day | ORAL | Status: DC

## 2022-12-29 MED ORDER — METOPROLOL SUCCINATE ER 25 MG PO TB24: 12.5000 mg | ORAL_TABLET | Freq: Every day | ORAL | 2 refills | Status: DC

## 2022-12-29 MED ORDER — METOPROLOL SUCCINATE ER 25 MG PO TB24: 12.5000 mg | ORAL_TABLET | Freq: Every day | ORAL | Status: DC

## 2022-12-29 MED ORDER — LISINOPRIL 10 MG PO TABS: 10.0000 mg | ORAL_TABLET | Freq: Every day | ORAL | Status: DC

## 2022-12-29 MED ORDER — SALINE SPRAY 0.65 % NA SOLN
1.0000 | Freq: Two times a day (BID) | NASAL | Status: DC
  Filled 2022-12-29: qty 44

## 2022-12-29 MED ORDER — ENTRESTO 49-51 MG PO TABS: 1.0000 | ORAL_TABLET | Freq: Two times a day (BID) | ORAL | Status: DC

## 2022-12-29 MED ORDER — EMPAGLIFLOZIN 10 MG PO TABS: 10.0000 mg | ORAL_TABLET | Freq: Every day | ORAL | Status: DC

## 2022-12-29 MED ORDER — EMPAGLIFLOZIN 10 MG PO TABS
10.0000 mg | ORAL_TABLET | Freq: Every day | ORAL | 0 refills | Status: DC
  Filled 2022-12-29: qty 30, 30d supply, fill #0

## 2022-12-29 NOTE — Discharge Instructions (Signed)
Follow with Primary MD in 7 days  ° °Get CBC, CMP,  checked  by Primary MD next visit.  ° ° °Activity: As tolerated with Full fall precautions use walker/cane & assistance as needed ° ° °Disposition Home  ° ° °Diet: Heart Healthy  ° ° °On your next visit with your primary care physician please Get Medicines reviewed and adjusted. ° ° °Please request your Prim.MD to go over all Hospital Tests and Procedure/Radiological results at the follow up, please get all Hospital records sent to your Prim MD by signing hospital release before you go home. ° ° °If you experience worsening of your admission symptoms, develop shortness of breath, life threatening emergency, suicidal or homicidal thoughts you must seek medical attention immediately by calling 911 or calling your MD immediately  if symptoms less severe. ° °You Must read complete instructions/literature along with all the possible adverse reactions/side effects for all the Medicines you take and that have been prescribed to you. Take any new Medicines after you have completely understood and accpet all the possible adverse reactions/side effects.  ° °Do not drive, operating heavy machinery, perform activities at heights, swimming or participation in water activities or provide baby sitting services if your were admitted for syncope or siezures until you have seen by Primary MD or a Neurologist and advised to do so again. ° °Do not drive when taking Pain medications.  ° ° °Do not take more than prescribed Pain, Sleep and Anxiety Medications ° °Special Instructions: If you have smoked or chewed Tobacco  in the last 2 yrs please stop smoking, stop any regular Alcohol  and or any Recreational drug use. ° °Wear Seat belts while driving. ° ° °Please note ° °You were cared for by a hospitalist during your hospital stay. If you have any questions about your discharge medications or the care you received while you were in the hospital after you are discharged, you can call the  unit and asked to speak with the hospitalist on call if the hospitalist that took care of you is not available. Once you are discharged, your primary care physician will handle any further medical issues. Please note that NO REFILLS for any discharge medications will be authorized once you are discharged, as it is imperative that you return to your primary care physician (or establish a relationship with a primary care physician if you do not have one) for your aftercare needs so that they can reassess your need for medications and monitor your lab values. ° °

## 2022-12-29 NOTE — TOC CM/SW Note (Signed)
Transition of Care [facility]) - Inpatient Brief Assessment   Patient Details  Name: Riley Jefferson MRN: [id number] Date of Birth: 1960-11-30  Transition of Care [facility]) CM/SW Contact:    Gordy Clement, RN Phone Number: 12/29/2022, 9:14 AM   Clinical Narrative:  Patient to ED from home with nsebleed.  Will dc today with Wife- No TOC needs identified     Transition of Care Asessment: Insurance and Status: Insurance coverage has been reviewed Patient has primary care physician: Yes Riley Jefferson, Charlies Constable, MD) Home environment has been reviewed: Home with Wife Prior level of function:: Independent Prior/Current Home Services: No current home services Social Determinants of Health Reivew: SDOH reviewed no interventions necessary Readmission risk has been reviewed: Yes (marked as N/A) Transition of care needs: no transition of care needs at this time

## 2022-12-29 NOTE — Discharge Summary (Signed)
Physician Discharge Summary  Riley Jefferson YNW:[id number] DOB: Jul 26, 1961 DOA: 12/28/2022  PCP: System, Provider Not In  Admit date: 12/28/2022 Discharge date: 12/29/2022  Admitted From: (Home) Disposition:  (Home)  Recommendations for Outpatient Follow-up:  Follow up with PCP in 1-2 weeks Please obtain BMP/CBC in one week Eliquis on hold for next 48 hours, to be resumed by Friday.  Discharge Condition: (Stable)   Brief/Interim Summary:  Riley Jefferson is a 62 y.o. male with medical history significant of PAF on Eliquis, HTN, chronic HFrEF LVEF 40-45%, presented with recurrent nosebleed. - Symptoms started day before admission , patient woke up with severe bleed from left nostril.  Nosebleed and went to [facility] ED for the first time.  Denies any trauma no headache no fever or chills no chest pain or shortness of breath.  At [facility] long ED, patient was monitored for the morning and ED evaluation showed anterior nosebleed has stopped and no active bleeding patient was monitored for few hours and then discharged home.  However yesterday evening patient came back to ED the second time for recurrent left nostril bleeding, this time patient was treated with packing and Rhino Rocket and sent home.  This morning however again, patient found the left nostril bleeding resume and the bleeding around the Rhino Rocket side and came to ED again this time patient does feel lightheadedness but denies any chest pain shortness of breath.  Patient reported last dose of Eliquis was 2 days ago.  Significant elevated with SBP> 200.  Patient appears to be very anxious.  ENT recommended no acute intervention needed but recommend patient get better blood pressure control.  Hemoglobin 14.5 about his baseline.  He was seen by ENT, recommendation to observe overnight, please see discussion below  Recurrent epistaxis -Left nostril, status post nasal packing and Rhino Rocket, ENT input greatly appreciated, would  not allow ENT physician to finish the packing with Merocel into the left side due to pain, he has surgical in the superior and inferior nose, recommendation to watch overnight, this morning he has no recurrent bleed, has discussed with ENT at time of discharge, patient has dissolvable packing right now, he should continue with statin Juliane twice daily, no indication to follow with ENT as an outpatient as long no recurrence for bleed, I have instructed him to hold Eliquis for next 48 hours and then to resume on Friday. -Hypertension contributing to his nasal bleed, but overall his blood pressure is controlled, patient report readings at home systolic 120s to 657Q, and he is compliant with his Entresto and Toprol-XL, initial elevated increasing worry in the setting of anxiety and pain due to nasal bleed, but overall blood pressure is well-controlled on current regimen.  Hypokalemia -P.o. replacement   Chronic HFrEF -Clinically appears to be euvolemic, was recently resumed on Entresto and started on Jardiance by his cardiologist, kept on Toprol-XL, this meds has been resumed at time of discharge    Chronic A-fib -Rate controlled, denies any palpitations, continue with Toprol-XL, to resume Eliquis in 48 hours due to epistaxis    Discharge Diagnoses:  Principal Problem:   Bleeding from the nose Active Problems:   Atrial fibrillation [facility])   Epistaxis    Discharge Instructions  Discharge Instructions     Diet - low sodium heart healthy   Complete by: As directed    Discharge instructions   Complete by: As directed    Follow with Primary MD  in 7 days   Get CBC, CMP,  checked  by Primary MD next visit.    Activity: As tolerated with Full fall precautions use walker/cane & assistance as needed   Disposition Home    Diet: Heart Healthy   On your next visit with your primary care physician please Get Medicines reviewed and adjusted.   Please request your Prim.MD to go over all  Hospital Tests and Procedure/Radiological results at the follow up, please get all Hospital records sent to your Prim MD by signing hospital release before you go home.   If you experience worsening of your admission symptoms, develop shortness of breath, life threatening emergency, suicidal or homicidal thoughts you must seek medical attention immediately by calling 911 or calling your MD immediately  if symptoms less severe.  You Must read complete instructions/literature along with all the possible adverse reactions/side effects for all the Medicines you take and that have been prescribed to you. Take any new Medicines after you have completely understood and accpet all the possible adverse reactions/side effects.   Do not drive, operating heavy machinery, perform activities at heights, swimming or participation in water activities or provide baby sitting services if your were admitted for syncope or siezures until you have seen by Primary MD or a Neurologist and advised to do so again.  Do not drive when taking Pain medications.    Do not take more than prescribed Pain, Sleep and Anxiety Medications  Special Instructions: If you have smoked or chewed Tobacco  in the last 2 yrs please stop smoking, stop any regular Alcohol  and or any Recreational drug use.  Wear Seat belts while driving.   Please note  You were cared for by a hospitalist during your hospital stay. If you have any questions about your discharge medications or the care you received while you were in the hospital after you are discharged, you can call the unit and asked to speak with the hospitalist on call if the hospitalist that took care of you is not available. Once you are discharged, your primary care physician will handle any further medical issues. Please note that NO REFILLS for any discharge medications will be authorized once you are discharged, as it is imperative that you return to your primary care physician (or  establish a relationship with a primary care physician if you do not have one) for your aftercare needs so that they can reassess your need for medications and monitor your lab values.   Increase activity slowly   Complete by: As directed       Allergies as of 12/29/2022   No Known Allergies      Medication List     TAKE these medications    acetaminophen 325 MG tablet Commonly known as: TYLENOL Take 2 tablets (650 mg total) by mouth every 6 (six) hours as needed for mild pain (or Fever >/= 101).   apixaban 5 MG Tabs tablet Commonly known as: ELIQUIS Take 1 tablet (5 mg total) by mouth 2 (two) times daily.   empagliflozin 10 MG Tabs tablet Commonly known as: JARDIANCE Take 1 tablet (10 mg total) by mouth daily.   Entresto 49-51 MG Generic drug: sacubitril-valsartan Take 1 tablet by mouth 2 (two) times daily.   furosemide 40 MG tablet Commonly known as: LASIX Take 1 tablet (40 mg total) by mouth daily. What changed: when to take this   latanoprost 0.005 % ophthalmic solution Commonly known as: XALATAN Place 1 drop into both eyes at bedtime.   metoprolol succinate 25 MG  24 hr tablet Commonly known as: TOPROL-XL Take 0.5 tablets (12.5 mg total) by mouth daily. Start taking on: January 01, 2023 What changed: These instructions start on January 01, 2023. If you are unsure what to do until then, ask your doctor or other care provider.        No Known Allergies  Consultations: ENT   Procedures/Studies: DG Chest 1 View  Result Date: 12/29/2022 CLINICAL DATA:  CHF. EXAM: CHEST  1 VIEW COMPARISON:  06/08/2022 scalp 06/08/2022 FINDINGS: The heart size and mediastinal contours are within normal limits. Both lungs are clear. The visualized skeletal structures are unremarkable. IMPRESSION: No active disease. Electronically Signed   By: Signa Kell M.D.   On: 12/29/2022 07:26      Subjective: No further epistaxis overnight.  Discharge Exam: Vitals:   12/29/22 0453  12/29/22 0821  BP: (!) 144/94 (!) 162/88  Pulse: 84   Resp: 17   Temp: 98.2 F (36.8 C)   SpO2: 95%    Vitals:   12/28/22 1600 12/28/22 2228 12/29/22 0453 12/29/22 0821  BP:  133/77 (!) 144/94 (!) 162/88  Pulse:  86 84   Resp:  19 17   Temp:  98 F (36.7 C) 98.2 F (36.8 C)   TempSrc:  Oral Oral Oral  SpO2: 98% 96% 95%   Weight:      Height:        General: Pt is alert, awake, not in acute distress Cardiovascular: RRR, S1/S2 +, no rubs, no gallops Respiratory: CTA bilaterally, no wheezing, no rhonchi Abdominal: Soft, NT, ND, bowel sounds + Extremities: no edema, no cyanosis    The results of significant diagnostics from this hospitalization (including imaging, microbiology, ancillary and laboratory) are listed below for reference.     Microbiology: No results found for this or any previous visit (from the past 240 hour(s)).   Labs: BNP (last 3 results) Recent Labs    06/08/22 2145  BNP 45.8   Basic Metabolic Panel: Recent Labs  Lab 12/27/22 2028 12/28/22 0740  NA 138 137  K 3.4* 3.3*  CL 102 106  CO2 23 25  GLUCOSE 98 106*  BUN 14 14  CREATININE 1.01 0.82  CALCIUM 9.5 9.1   Liver Function Tests: Recent Labs  Lab 12/27/22 2028  AST 24  ALT 21  ALKPHOS 88  BILITOT 0.6  PROT 7.3  ALBUMIN 3.5   No results for input(s): "LIPASE", "AMYLASE" in the last 168 hours. No results for input(s): "AMMONIA" in the last 168 hours. CBC: Recent Labs  Lab 12/27/22 2028 12/28/22 0740 12/28/22 1628 12/28/22 2029 12/29/22 0352  WBC 6.7 5.2  --   --  9.0  NEUTROABS  --  2.3  --   --   --   HGB 14.7 14.8 17.3* 16.6 16.1  HCT 44.9 44.8 51.0 49.7 47.2  MCV 83.5 83.3  --   --  80.0  PLT 242 236  --   --  260   Cardiac Enzymes: No results for input(s): "CKTOTAL", "CKMB", "CKMBINDEX", "TROPONINI" in the last 168 hours. BNP: Invalid input(s): "POCBNP" CBG: Recent Labs  Lab 12/29/22 0819  GLUCAP 139*   D-Dimer No results for input(s): "DDIMER" in the  last 72 hours. Hgb A1c No results for input(s): "HGBA1C" in the last 72 hours. Lipid Profile No results for input(s): "CHOL", "HDL", "LDLCALC", "TRIG", "CHOLHDL", "LDLDIRECT" in the last 72 hours. Thyroid function studies No results for input(s): "TSH", "T4TOTAL", "T3FREE", "THYROIDAB" in the last 72 hours.  Invalid input(s): "FREET3" Anemia work up No results for input(s): "VITAMINB12", "FOLATE", "FERRITIN", "TIBC", "IRON", "RETICCTPCT" in the last 72 hours. Urinalysis No results found for: "COLORURINE", "APPEARANCEUR", "LABSPEC", "PHURINE", "GLUCOSEU", "HGBUR", "BILIRUBINUR", "KETONESUR", "PROTEINUR", "UROBILINOGEN", "NITRITE", "LEUKOCYTESUR" Sepsis Labs Recent Labs  Lab 12/27/22 2028 12/28/22 0740 12/29/22 0352  WBC 6.7 5.2 9.0   Microbiology No results found for this or any previous visit (from the past 240 hour(s)).   Time coordinating discharge: Over 30 minutes  SIGNED:   Huey Bienenstock, MD  [facility] 12/29/2022, 9:29 AM Pager   If 7PM-7AM, please contact night-coverage [email or web address]

## 2022-12-29 NOTE — Telephone Encounter (Signed)
**Note De-Identified Coltrane Tugwell Obfuscation** 6/4-READY-No PA REQ for Itamar per Ethelda Chick. at [facility].

## 2022-12-29 NOTE — TOC Benefit Eligibility Note (Signed)
Patient Product/process development scientist completed.    The patient is currently admitted and upon discharge could be taking Jardiance 10 mg.  The current 30 day co-pay is $4.00.   The patient is insured through [facility] [place]   This test claim was processed through [facility]- copay amounts may vary at other pharmacies due to pharmacy/plan contracts, or as the patient moves through the different stages of their insurance plan.  Roland Earl, CPHT Pharmacy Patient Advocate Specialist [facility] Patient Advocate Team Direct Number: [phone]  Fax: [phone]

## 2022-12-30 ENCOUNTER — Other Ambulatory Visit: Payer: Self-pay | Admitting: Nurse Practitioner

## 2023-04-07 ENCOUNTER — Emergency Department (HOSPITAL_COMMUNITY)
Admission: EM | Admit: 2023-04-07 | Discharge: 2023-04-07 | Disposition: A | Discharge status: Home / Self Care | Payer: Medicaid Other | Attending: Emergency Medicine | Admitting: Emergency Medicine

## 2023-04-07 ENCOUNTER — Emergency Department (HOSPITAL_COMMUNITY): Payer: Medicaid Other

## 2023-04-07 ENCOUNTER — Other Ambulatory Visit: Payer: Self-pay

## 2023-04-07 DIAGNOSIS — I110 Hypertensive heart disease with heart failure: Secondary | ICD-10-CM | POA: Insufficient documentation

## 2023-04-07 DIAGNOSIS — S8391XA Sprain of unspecified site of right knee, initial encounter: Secondary | ICD-10-CM | POA: Insufficient documentation

## 2023-04-07 DIAGNOSIS — I4891 Unspecified atrial fibrillation: Secondary | ICD-10-CM | POA: Diagnosis not present

## 2023-04-07 DIAGNOSIS — Z7901 Long term (current) use of anticoagulants: Secondary | ICD-10-CM | POA: Insufficient documentation

## 2023-04-07 DIAGNOSIS — M25561 Pain in right knee: Secondary | ICD-10-CM | POA: Diagnosis present

## 2023-04-07 DIAGNOSIS — I509 Heart failure, unspecified: Secondary | ICD-10-CM | POA: Insufficient documentation

## 2023-04-07 DIAGNOSIS — W010XXA Fall on same level from slipping, tripping and stumbling without subsequent striking against object, initial encounter: Secondary | ICD-10-CM | POA: Diagnosis not present

## 2023-04-07 MED ORDER — ACETAMINOPHEN 500 MG PO TABS
1000.0000 mg | ORAL_TABLET | ORAL | Status: AC
  Administered 2023-04-07: 1000 mg via ORAL
  Filled 2023-04-07: qty 2

## 2023-04-07 MED ORDER — OXYCODONE HCL 5 MG PO TABS: 5.0000 mg | ORAL_TABLET | ORAL | 0 refills | Status: DC | PRN

## 2023-04-07 NOTE — Discharge Instructions (Signed)
You were seen for your knee injury in the emergency department.  You likely have sprained your knee.  At home, please use Tylenol and ice for your pain.  You may take the oxycodone for any breakthrough pain that you have but do not take it before driving or operating heavy machinery since it can make you drowsy.    Check your MyChart online for the results of any tests that had not resulted by the time you left the emergency department.   Follow-up with orthopedics in 1 week.  Return immediately to the emergency department if you experience any of the following: Unbearable pain, or any other concerning symptoms.    Thank you for visiting our Emergency Department. It was a pleasure taking care of you today.

## 2023-04-07 NOTE — ED Triage Notes (Signed)
Pt arrives to ED c/o right sided knee pain after tripping on carpet yesterday. Pt denies hitting floor put states he feels as if he pulled something. Pain is behind the knee.

## 2023-04-07 NOTE — ED Provider Notes (Signed)
[place] EMERGENCY DEPARTMENT AT [facility] Provider Note   CSN: [id number] Arrival date & time: 04/07/23  2952     History  Chief Complaint  Patient presents with   Knee Pain    Riley Jefferson is a 62 y.o. male.  62 year old male with a history of atrial fibrillation on Eliquis, hypertension, and heart failure with reduced ejection fraction who presents emergency department with right knee pain.  Patient says that last night he tripped on some carpet and felt a pop in his right knee.  Says he has had difficulty bearing weight on his leg since then.  Noticed some swelling this morning when he woke up.  Did not fall to the ground and no other injuries.       Home Medications Prior to Admission medications   Medication Sig Start Date End Date Taking? Authorizing Provider  oxyCODONE (ROXICODONE) 5 MG immediate release tablet Take 1 tablet (5 mg total) by mouth every 4 (four) hours as needed for severe pain. 04/07/23  Yes Rondel Baton, MD  acetaminophen (TYLENOL) 325 MG tablet Take 2 tablets (650 mg total) by mouth every 6 (six) hours as needed for mild pain (or Fever >/= 101). 06/10/22   Marguerita Merles Latif, DO  apixaban (ELIQUIS) 5 MG TABS tablet Take 1 tablet (5 mg total) by mouth 2 (two) times daily. 06/16/22   Swinyer, Zachary George, NP  empagliflozin (JARDIANCE) 10 MG TABS tablet Take 1 tablet (10 mg total) by mouth daily. 12/29/22   Elgergawy, Leana Roe, MD  furosemide (LASIX) 40 MG tablet Take 1 tablet (40 mg total) by mouth daily. Patient taking differently: Take 40 mg by mouth 2 (two) times daily. 10/14/22 10/14/23  Swinyer, Zachary George, NP  latanoprost (XALATAN) 0.005 % ophthalmic solution Place 1 drop into both eyes at bedtime. 12/19/22   [provider]  metoprolol succinate (TOPROL-XL) 25 MG 24 hr tablet Take 0.5 tablets (12.5 mg total) by mouth daily. 01/01/23   Elgergawy, Leana Roe, MD  sacubitril-valsartan (ENTRESTO) 49-51 MG Take 1 tablet by mouth 2 (two)  times daily. 12/29/22   Elgergawy, Leana Roe, MD      Allergies    Patient has no known allergies.    Review of Systems   Review of Systems  Physical Exam Updated Vital Signs BP 139/82   Pulse 79   Temp 97.8 F (36.6 C) (Oral)   Resp 17   Ht 6\' 4"  (1.93 m)   Wt 133.8 kg   SpO2 96%   BMI 35.91 kg/m  Physical Exam Vitals and nursing note reviewed.  Constitutional:      General: He is not in acute distress.    Appearance: He is well-developed.  HENT:     Head: Normocephalic and atraumatic.     Right Ear: External ear normal.     Left Ear: External ear normal.     Nose: Nose normal.  Eyes:     Extraocular Movements: Extraocular movements intact.     Conjunctiva/sclera: Conjunctivae normal.     Pupils: Pupils are equal, round, and reactive to light.  Pulmonary:     Effort: Pulmonary effort is normal. No respiratory distress.  Musculoskeletal:     Cervical back: Normal range of motion and neck supple.     Right lower leg: No edema.     Left lower leg: No edema.     Comments: Tenderness palpation on lateral aspect of right knee.  Minimal amounts of effusion noted.  No  obvious deformities.  DP pulses 2+ bilaterally with intact sensation to light touch of lower extremity distally.  Skin:    General: Skin is warm and dry.  Neurological:     Mental Status: He is alert. Mental status is at baseline.  Psychiatric:        Mood and Affect: Mood normal.        Behavior: Behavior normal.     ED Results / Procedures / Treatments   Labs (all labs ordered are listed, but only abnormal results are displayed) Labs Reviewed - No data to display  EKG None  Radiology DG Knee Complete 4 Views Right  Result Date: 04/07/2023 CLINICAL DATA:  knee pain Pt tripped over rug yesterday and felt twinge in right knee - having posterior right knee pain and feeling of tightness since - pt states pain when trying to stand or walk EXAM: RIGHT KNEE - COMPLETE 4+ VIEW COMPARISON:  X-ray right knee  10/21/2021 FINDINGS: No evidence of fracture, dislocation, or joint effusion. Moderate - severe tricompartmental degenerative changes of the knee. Other focal bone abnormality. Soft tissues are unremarkable. IMPRESSION: 1. No acute displaced fracture or dislocation. 2. Moderate - severe tricompartmental degenerative changes of the knee. Electronically Signed   By: Tish Frederickson M.D.   On: 04/07/2023 07:52    Procedures Procedures    Medications Ordered in ED Medications  acetaminophen (TYLENOL) tablet 1,000 mg (1,000 mg Oral Given 04/07/23 0735)    ED Course/ Medical Decision Making/ A&P                                 Medical Decision Making Amount and/or Complexity of Data Reviewed Radiology: ordered.  Risk OTC drugs. Prescription drug management.   Riley Jefferson is a 62 y.o. male with comorbidities that complicate the patient evaluation including atrial fibrillation on Eliquis, hypertension, and heart failure with reduced ejection fraction who presents emergency department with right knee pain.     Initial Ddx:  Fracture, ligamentous injury, meniscal injury  MDM/Course:  Patient presents to the emergency department after tripping and feeling a pop in his knee.  Reports some mild swelling and pain but no significant effusion on exam.  Had x-rays that did not show evidence of fracture.  Suspect that the patient either has a ligamentous injury or meniscal injury.  Was given crutches and instructed to follow-up with orthopedics for additional evaluation.    This patient presents to the ED for concern of complaints listed in HPI, this involves an extensive number of treatment options, and is a complaint that carries with it a high risk of complications and morbidity. Disposition including potential need for admission considered.   Dispo: DC Home. Return precautions discussed including, but not limited to, those listed in the AVS. Allowed pt time to ask questions which were answered  fully prior to dc.  Records reviewed Outpatient Clinic Notes I independently reviewed the following imaging with scope of interpretation limited to determining acute life threatening conditions related to emergency care: Extremity x-ray(s) and agree with the radiologist interpretation with the following exceptions: none I have reviewed the patients home medications and made adjustments as needed   Final Clinical Impression(s) / ED Diagnoses Final diagnoses:  Acute pain of right knee  Sprain of right knee, unspecified ligament, initial encounter    Rx / DC Orders ED Discharge Orders          Ordered  oxyCODONE (ROXICODONE) 5 MG immediate release tablet  Every 4 hours PRN        04/07/23 0808              Rondel Baton, MD 04/07/23 [phone]

## 2023-06-03 NOTE — Progress Notes (Deleted)
Cardiology Office Note:   Date:  06/04/2023  ID:  Riley Jefferson, DOB 1960-08-20, MRN [id number] PCP:  System, Provider Not In  [facility] Providers Cardiologist:  Alverda Skeans, MD Referring MD: No ref. provider found  Chief Complaint/Reason for Referral: Cardiology follow-up ASSESSMENT:    1. Chronic combined systolic and diastolic heart failure (HCC)   2. Paroxysmal atrial fibrillation (HCC)   3. BMI 37.0-37.9, adult   4. Snoring   5. CKD (chronic kidney disease) stage 2, GFR 60-89 ml/min     PLAN:   In order of problems listed above: Systolic and diastolic heart failure: Continue Jardiance, Lasix, Toprol, and Entresto.*** Paroxysmal atrial fibrillation: Continue Eliquis and Toprol. Elevated BMI: Check hemoglobin A1c, if diabetic will refer to pharmacy for GLP-1 receptor agonist therapy and alert PCP.*** Snoring: Refer for sleep study.*** 5.  CKD stage II: Continue Jardiance and Entresto.       {Are you ordering a CV Procedure (e.g. stress test, cath, DCCV, TEE, etc)?   Press F2        :[id number]}   Dispo:  No follow-ups on file.      Medication Adjustments/Labs and Tests Ordered: Current medicines are reviewed at length with the patient today.  Concerns regarding medicines are outlined above.  The following changes have been made:  {PLAN; NO CHANGE:13088:s}   Labs/tests ordered: No orders of the defined types were placed in this encounter.   Medication Changes: No orders of the defined types were placed in this encounter.   Current medicines are reviewed at length with the patient today.  The patient {ACTIONS; HAS/DOES NOT HAVE:19233} concerns regarding medicines.  I spent *** minutes reviewing all clinical data during and prior to this visit including all relevant imaging studies, laboratories, clinical information from other health systems, and prior notes from both Cardiology and other specialties, interviewing the patient, and conducting a complete physical  examination in order to formulate a comprehensive and personalized evaluation and treatment plan.  History of Present Illness:      FOCUSED PROBLEM LIST:   Paroxysmal atrial fibrillation CV 2 score 2 On Eliquis Nonischemic cardiomyopathy EF 40 to 45% TTE 2023 PET stress 2022 negative [place], [place]) EF 40 to 45% TTE 2020 [facility], [place]) BMI 37 CKD stage II  08/13/2022 visit: The patient is a 62 y.o. male with the indicated medical history here for cardiology follow up.  The patient had been admitted with acute on chronic combined diastolic and systolic heart failure in November.  His troponins and BNP were normal.  He was started on lasix 20mg  and discharged.  He was seen in November in the outpatient clinic with dyspnea.  He was not able to start [place] or [place] and LCSW was contacted.  He was seen in December and was noted to be disoriented and reported breathing difficulty when stressed.   Today he tells me that he has been more short of breath of late.  He tells me his weight has been going up over the last several weeks.  He reports dyspnea with exertion and orthopnea as well as some paroxysmal nocturnal dyspnea.  He denies any presyncope or syncope.  He has some chest heaviness at times but this is not routine.  He fortunately has not required any emergency room visits or hospitalizations.  He has been compliant with his medical therapy.   He does tell me that he snores and endorses daytime somnolence.  Plan: Start Jardiance 10 mg daily and increase Lasix to 60 mg  twice daily for 3 days then down to a new chronic dose of 40 mg twice a day with a BMP in 1 week; refer to pharmacy for elevated BMI; refer for sleep apnea evaluation given snoring.  Follow-up in 1 week.  November 2024: The patient is seen for routine follow-up.  She was last seen in May of this year.  At that time he reported not starting Jardiance or Entresto as he was concerned about kidney dysfunction.  His Jardiance and  Sherryll Burger were resumed and it was noted that his blood pressure was quite elevated.  He never got his sleep study so arrangements were made for this to occur.              Current Medications: No outpatient medications have been marked as taking for the 06/07/23 encounter (Appointment) with Orbie Pyo, MD.     Review of Systems:   Please see the history of present illness.    All other systems reviewed and are negative.     EKGs/Labs/Other Test Reviewed:   EKG: EKG performed June 2024 demonstrates atrial fibrillation and left ventricular hypertrophy  EKG Interpretation Date/Time:    Ventricular Rate:    PR Interval:    QRS Duration:    QT Interval:    QTC Calculation:   R Axis:      Text Interpretation:           Risk Assessment/Calculations:   {Does this patient have ATRIAL FIBRILLATION?:[id number]} STOP-Bang Score:  8  { Consider Dx Sleep Disordered Breathing or Sleep Apnea  ICD G47.33          :1}     Physical Exam:   VS:  There were no vitals taken for this visit.   No BP recorded.  {Refresh Note OR Click here to enter BP  :1}***   Wt Readings from Last 3 Encounters:  04/07/23 295 lb (133.8 kg)  12/28/22 278 lb 10.6 oz (126.4 kg)  12/28/22 297 lb (134.7 kg)      GENERAL:  No apparent distress, AOx3 HEENT:  No carotid bruits, +2 carotid impulses, no scleral icterus CAR: RRR Irregular RR*** no murmurs***, gallops, rubs, or thrills RES:  Clear to auscultation bilaterally ABD:  Soft, nontender, nondistended, positive bowel sounds x 4 VASC:  +2 radial pulses, +2 carotid pulses NEURO:  CN 2-12 grossly intact; motor and sensory grossly intact PSYCH:  No active depression or anxiety EXT:  No edema, ecchymosis, or cyanosis  Signed, Orbie Pyo, MD  06/04/2023 9:55 AM    [facility] [address] [place], [place], [place]  [address] Phone: [phone]; Fax: [phone]   Note:  This document was prepared using Dragon voice  recognition software and may include unintentional dictation errors.

## 2023-06-07 ENCOUNTER — Ambulatory Visit: Payer: Medicaid Other | Admitting: Internal Medicine

## 2023-06-07 DIAGNOSIS — Z6837 Body mass index (BMI) 37.0-37.9, adult: Secondary | ICD-10-CM

## 2023-06-07 DIAGNOSIS — R0683 Snoring: Secondary | ICD-10-CM

## 2023-06-07 DIAGNOSIS — I480 Paroxysmal atrial fibrillation: Secondary | ICD-10-CM

## 2023-06-07 DIAGNOSIS — I5042 Chronic combined systolic (congestive) and diastolic (congestive) heart failure: Secondary | ICD-10-CM

## 2023-06-07 DIAGNOSIS — N182 Chronic kidney disease, stage 2 (mild): Secondary | ICD-10-CM

## 2023-06-07 NOTE — Progress Notes (Unsigned)
Cardiology Office Note:   Date:  06/09/2023  ID:  Riley Jefferson, DOB Mar 16, 1961, MRN [id number] PCP:  System, Provider Not In  [facility] Providers Cardiologist:  Alverda Skeans, MD Referring MD: No ref. provider found  Chief Complaint/Reason for Referral:  Cardiology follow-up  ASSESSMENT:    1. Chronic combined systolic and diastolic heart failure (HCC)   2. Paroxysmal atrial fibrillation (HCC)   3. Secondary hypercoagulable state (HCC)   4. BMI 37.0-37.9, adult   5. Snoring   6. CKD (chronic kidney disease) stage 2, GFR 60-89 ml/min     PLAN:   In order of problems listed above: Systolic and diastolic heart failure: The patient has been off [place] and [place] for several months.  He has issues with obtaining these medications.  We will enlist patient assistance.  For now we will start losartan 25 mg and increase his Lasix to 40 mg twice daily.  Will obtain a BMP in 1 week.  Will have patient return for follow-up in 1 to 2 weeks. Paroxysmal atrial fibrillation: Restart Eliquis and continue Toprol. Secondary hypercoagulable state: Restart Eliquis. Elevated BMI: We will check hemoglobin A1c.  If this is the case we will refer to pharmacy for recommendations regarding GLP-1 receptor agonist therapy. Snoring: Will refer for sleep study in future. CKD stage II: Start losartan.           Dispo:  Return in about 2 weeks (around 06/23/2023).      Medication Adjustments/Labs and Tests Ordered: Current medicines are reviewed at length with the patient today.  Concerns regarding medicines are outlined above.  The following changes have been made:     Labs/tests ordered: Orders Placed This Encounter  Procedures   Basic Metabolic Panel (BMET)   HgB A1c   EKG 12-Lead    Medication Changes: Meds ordered this encounter  Medications   losartan (COZAAR) 25 MG tablet    Sig: Take 1 tablet (25 mg total) by mouth daily.    Dispense:  90 tablet    Refill:  3    Stop  Entresto due to cost   furosemide (LASIX) 40 MG tablet    Sig: Take 1 tablet (40 mg total) by mouth 2 (two) times daily.    Dispense:  180 tablet    Refill:  3    Increased to twice daily   apixaban (ELIQUIS) 5 MG TABS tablet    Sig: Take 1 tablet (5 mg total) by mouth 2 (two) times daily.    Dispense:  60 tablet    Refill:  5    Current medicines are reviewed at length with the patient today.  The patient does not have concerns regarding medicines.  I spent 38 minutes reviewing all clinical data during and prior to this visit including all relevant imaging studies, laboratories, clinical information from other health systems, and prior notes from both Cardiology and other specialties, interviewing the patient, and conducting a complete physical examination in order to formulate a comprehensive and personalized evaluation and treatment plan.  History of Present Illness:      FOCUSED PROBLEM LIST:   Paroxysmal atrial fibrillation CV 2 score 2 On Eliquis Nonischemic cardiomyopathy EF 40 to 45% TTE 2023 PET stress 2022 negative [place], [place]) EF 40 to 45% TTE 2020 [facility] [place], [place]) BMI 37 CKD stage II  08/13/2022 visit: The patient is a 62 y.o. male with the indicated medical history here for cardiology follow up.  The patient had been admitted with acute on chronic  combined diastolic and systolic heart failure in November.  His troponins and BNP were normal.  He was started on lasix 20mg  and discharged.  He was seen in November in the outpatient clinic with dyspnea.  He was not able to start [place] or [place] and LCSW was contacted.  He was seen in December and was noted to be disoriented and reported breathing difficulty when stressed.   Today he tells me that he has been more short of breath of late.  He tells me his weight has been going up over the last several weeks.  He reports dyspnea with exertion and orthopnea as well as some paroxysmal nocturnal dyspnea.  He denies any presyncope  or syncope.  He has some chest heaviness at times but this is not routine.  He fortunately has not required any emergency room visits or hospitalizations.  He has been compliant with his medical therapy.   He does tell me that he snores and endorses daytime somnolence.  Plan: Start Jardiance 10 mg daily and increase Lasix to 60 mg twice daily for 3 days then down to a new chronic dose of 40 mg twice a day with a BMP in 1 week; refer to pharmacy for elevated BMI; refer for sleep apnea evaluation given snoring.  Follow-up in 1 week.  November 2024: The patient is seen for routine follow-up.  She was last seen in May of this year.  At that time he reported not starting Jardiance or Entresto as he was concerned about kidney dysfunction.  His Jardiance and Sherryll Burger were resumed and it was noted that his blood pressure was quite elevated.  He never got his sleep study so arrangements were made for this to occur.  He was seen in the emergency department in June due to a nosebleed.  This was thought to be due to high blood pressure and Eliquis.  ENT manage this.  The patient was discharged on his home medications with a plan to restart his Eliquis.  The patient never restarted his Eliquis.  He is having trouble getting his medications such as [place] and Entresto.  He is only taking Lasix and Toprol-XL.  He tells me that at times he is somewhat short of breath.  This does not happen all the time.  He tells me sometimes when he walks he becomes short of breath and has to stop and take a rest.  His weight has not changed.  He has no problems with orthopnea.  He has not noticed any significant peripheral edema.                  Current Medications: Current Meds  Medication Sig   acetaminophen (TYLENOL) 325 MG tablet Take 2 tablets (650 mg total) by mouth every 6 (six) hours as needed for mild pain (or Fever >/= 101).   apixaban (ELIQUIS) 5 MG TABS tablet Take 1 tablet (5 mg total) by mouth 2 (two) times  daily.   furosemide (LASIX) 40 MG tablet Take 1 tablet (40 mg total) by mouth 2 (two) times daily.   latanoprost (XALATAN) 0.005 % ophthalmic solution Place 1 drop into both eyes at bedtime.   losartan (COZAAR) 25 MG tablet Take 1 tablet (25 mg total) by mouth daily.   metoprolol succinate (TOPROL-XL) 25 MG 24 hr tablet Take 0.5 tablets (12.5 mg total) by mouth daily.   oxyCODONE (ROXICODONE) 5 MG immediate release tablet Take 1 tablet (5 mg total) by mouth every 4 (four) hours as needed  for severe pain.   [DISCONTINUED] furosemide (LASIX) 40 MG tablet Take 1 tablet (40 mg total) by mouth daily. (Patient taking differently: Take 40 mg by mouth 2 (two) times daily.)     Review of Systems:   Please see the history of present illness.    All other systems reviewed and are negative.     EKGs/Labs/Other Test Reviewed:   EKG:  EKG performed June 2024 demonstrates atrial fibrillation and left ventricular hypertrophy   EKG Interpretation Date/Time:  Wednesday June 09 2023 15:05:19 EST Ventricular Rate:  94 PR Interval:    QRS Duration:  118 QT Interval:  382 QTC Calculation: 477 R Axis:   -18  Text Interpretation: Atrial fibrillation Left ventricular hypertrophy with QRS widening ( R in aVL , Sokolow-Lyon , Cornell product ) Cannot rule out Anteroseptal infarct , age undetermined When compared with ECG of 28-Dec-2022 13:10, No significant change since last tracing Confirmed by Alverda Skeans ([id number]) on 06/09/2023 3:11:37 PM         Risk Assessment/Calculations:    CHA2DS2-VASc Score = 2   This indicates a 2.2% annual risk of stroke. The patient's score is based upon: CHF History: 1 HTN History: 1 Diabetes History: 0 Stroke History: 0 Vascular Disease History: 0 Age Score: 0 Gender Score: 0    STOP-Bang Score:  8       Physical Exam:   VS:  BP (!) 140/90   Pulse 65   Ht 6\' 4"  (1.93 m)   Wt 285 lb 12.8 oz (129.6 kg)   SpO2 98%   BMI 34.79 kg/m    HYPERTENSION  CONTROL Vitals:   06/09/23 1506 06/09/23 1529  BP: (!) 140/90 (!) 140/90    The patient's blood pressure is elevated above target today.  In order to address the patient's elevated BP: A new medication was prescribed today.      Wt Readings from Last 3 Encounters:  06/09/23 285 lb 12.8 oz (129.6 kg)  04/07/23 295 lb (133.8 kg)  12/28/22 278 lb 10.6 oz (126.4 kg)      GENERAL:  No apparent distress, AOx3 HEENT:  No carotid bruits, +2 carotid impulses, no scleral icterus CAR: Irregular RR no murmurs, gallops, rubs, or thrills RES:  Clear to auscultation bilaterally ABD:  Soft, nontender, nondistended, positive bowel sounds x 4 VASC:  +2 radial pulses, +2 carotid pulses NEURO:  CN 2-12 grossly intact; motor and sensory grossly intact PSYCH:  No active depression or anxiety EXT:  No edema, ecchymosis, or cyanosis  Signed, Orbie Pyo, MD  06/09/2023 3:53 PM    [facility] [address] [place], [place], [place]  [address] Phone: [phone]; Fax: [phone]   Note:  This document was prepared using Dragon voice recognition software and may include unintentional dictation errors.

## 2023-06-09 ENCOUNTER — Encounter: Payer: Self-pay | Admitting: Internal Medicine

## 2023-06-09 ENCOUNTER — Ambulatory Visit: Payer: Medicaid Other | Attending: Internal Medicine | Admitting: Internal Medicine

## 2023-06-09 VITALS — BP 140/90 | HR 65 | Ht 76.0 in | Wt 285.8 lb

## 2023-06-09 DIAGNOSIS — D6869 Other thrombophilia: Secondary | ICD-10-CM

## 2023-06-09 DIAGNOSIS — I5042 Chronic combined systolic (congestive) and diastolic (congestive) heart failure: Secondary | ICD-10-CM | POA: Diagnosis not present

## 2023-06-09 DIAGNOSIS — I480 Paroxysmal atrial fibrillation: Secondary | ICD-10-CM | POA: Diagnosis not present

## 2023-06-09 DIAGNOSIS — R0683 Snoring: Secondary | ICD-10-CM

## 2023-06-09 DIAGNOSIS — Z6837 Body mass index (BMI) 37.0-37.9, adult: Secondary | ICD-10-CM

## 2023-06-09 DIAGNOSIS — N182 Chronic kidney disease, stage 2 (mild): Secondary | ICD-10-CM

## 2023-06-09 MED ORDER — APIXABAN 5 MG PO TABS: 5.0000 mg | ORAL_TABLET | Freq: Two times a day (BID) | ORAL | 5 refills | Status: DC

## 2023-06-09 MED ORDER — FUROSEMIDE 40 MG PO TABS: 40.0000 mg | ORAL_TABLET | Freq: Two times a day (BID) | ORAL | 3 refills | Status: DC

## 2023-06-09 MED ORDER — LOSARTAN POTASSIUM 25 MG PO TABS: 25.0000 mg | ORAL_TABLET | Freq: Every day | ORAL | 3 refills | Status: DC

## 2023-06-09 NOTE — Addendum Note (Signed)
Addended by: Lendon Ka on: 06/09/2023 04:13 PM   Modules accepted: Orders

## 2023-06-09 NOTE — Patient Instructions (Addendum)
Medication Instructions:  Your physician has recommended you make the following change in your medication:  1.) restart Eliquis (apixaban) 5 mg - one tablet twice a day 2.) increase furosemide (Lasix) 40 mg - take one tablet twice a day 3. Start losartan 25 mg - take one tablet daily  *If you need a refill on your cardiac medications before your next appointment, please call your pharmacy*   Lab Work: Please return in 7-10 days for blood work (BMET, Designer, jewellery)   Testing/Procedures: none   Follow-Up: At [facility], you and your health needs are our priority.  As part of our continuing mission to provide you with exceptional heart care, we have created designated Provider Care Teams.  These Care Teams include your primary Cardiologist (physician) and Advanced Practice Providers (APPs -  Physician Assistants and Nurse Practitioners) who all work together to provide you with the care you need, when you need it.   Your next appointment:   2 week(s)  Provider:   Jari Favre, PA-C, Ronie Spies, PA-C, Robin Searing, NP, Jacolyn Reedy, PA-C, Eligha Bridegroom, NP, Tereso Newcomer, PA-C, or Perlie Gold, PA-C         Addendum: 06/09/23 4:12 pm   Called patient to confirm which [facility] he wishes to have the prescriptions sent.  He no longer uses the [facility]

## 2023-06-15 NOTE — Progress Notes (Deleted)
Cardiology Clinic Note   Patient Name: Riley Jefferson Date of Encounter: 06/15/2023  Primary Care Provider:  System, Provider Not In Primary Cardiologist:  Orbie Pyo, MD  Patient Profile    Riley Jefferson 61 year old male presents the clinic today for follow-up evaluation of his chronic combined systolic and diastolic CHF.  Past Medical History    Past Medical History:  Diagnosis Date   Atrial fibrillation (HCC)    Hypertension    No past surgical history on file.  Allergies  No Known Allergies  History of Present Illness    Riley Jefferson is a PMH of combined systolic and diastolic CHF, paroxysmal atrial fibrillation, CKD stage II, elevated BMI, and snoring.  Echocardiogram 2020) at [place] showed an EF of 40-45% echocardiogram 2023 showed an EF of 40-45%, cardiac PET CT 2022 in [place] was negative.  He was admitted with CHF exacerbation in November.  His troponins and BNP were normal.  He was started on diuresis and discharge.  He presented for follow-up in the outpatient clinic in November 2023.  He was noted to have dyspnea.  He was not able to start [place] or Entresto.  Social work consult was placed.  He followed up 12/23 and was noted to be disoriented.  He reported breathing difficulty.  A plan was made to start Jardiance and furosemide.  Follow-up lab work was also planned.  He reported snoring and endorsed daytime somnolence.  Sleep study was also planned.  He was seen in follow-up by Dr. Lynnette Caffey on 06/09/2023.  He had been off of his [place].  He reported that he never presented to perform sleep study.  He had been seen in the emergency department in June 2024 with nosebleed.  This was a result of his high blood pressure and being on apixaban.  ENT was consulted and managed.  He was discharged with plan to restart his apixaban.  He did not restart his apixaban.  He reported having trouble getting his medications including his  [place] and Entresto.  He was only taking furosemide and metoprolol.  He did note some shortness of breath.  This was intermittent.  His weight was stable.  He denied orthopnea.  Patient assistance referral was made.  He was restarted on losartan 25 mg daily.  His furosemide was increased to 40 mg twice daily.  BMP in 1 week was ordered.  His Eliquis was also restarted.  Follow-up was planned for 1-2 weeks.  He presents to the clinic today for follow-up evaluation and states***.  *** denies chest pain, shortness of breath, lower extremity edema, fatigue, palpitations, melena, hematuria, hemoptysis, diaphoresis, weakness, presyncope, syncope, orthopnea, and PND.  Chronic combined systolic and diastolic CHF-euvolemic.  Weight today***.  Breathing somewhat improved.  Seen in follow-up by Dr.Thukkani 06/09/2023.  His furosemide had been increased to 40 mg twice daily.  He had been off of his [place] and Sherryll Burger for several months.  He was started on losartan 25 mg daily. Increase losartan to 50 mg daily Continue furosemide, metoprolol Heart healthy low-sodium diet Increase physical activity as tolerated Daily weights-maintain weight log Lower extremity support stockings-[place] support stocking sheet given Order EMP  Paroxysmal atrial fibrillation-heart rate today***.  Reports compliance with apixaban.  Denies bleeding issues. Avoid triggers caffeine, chocolate, EtOH, dehydration etc. Continue apixaban, metoprolol  Daytime somnolence-order home sleep study STOP-BANG score***. Avoid supine sleeping Continue weight loss  CKD stage II-creatinine 0.82 on 12/28/2022.  Creatinine elevated to 1.42 on 10/13/2022. Maintain  p.o. hydration Order BMP  Disposition: Follow-up with Dr. Lovie Macadamia or APP in 1-2 months.  Home Medications    Prior to Admission medications   Medication Sig Start Date End Date Taking? Authorizing Provider  acetaminophen (TYLENOL) 325 MG tablet Take 2 tablets (650 mg total)  by mouth every 6 (six) hours as needed for mild pain (or Fever >/= 101). 06/10/22   Marguerita Merles Latif, DO  apixaban (ELIQUIS) 5 MG TABS tablet Take 1 tablet (5 mg total) by mouth 2 (two) times daily. 06/09/23   Orbie Pyo, MD  empagliflozin (JARDIANCE) 10 MG TABS tablet Take 1 tablet (10 mg total) by mouth daily. Patient not taking: Reported on 06/09/2023 12/29/22   Elgergawy, Leana Roe, MD  furosemide (LASIX) 40 MG tablet Take 1 tablet (40 mg total) by mouth 2 (two) times daily. 06/09/23   Orbie Pyo, MD  latanoprost (XALATAN) 0.005 % ophthalmic solution Place 1 drop into both eyes at bedtime. 12/19/22   [provider]  losartan (COZAAR) 25 MG tablet Take 1 tablet (25 mg total) by mouth daily. 06/09/23   Orbie Pyo, MD  metoprolol succinate (TOPROL-XL) 25 MG 24 hr tablet Take 0.5 tablets (12.5 mg total) by mouth daily. 01/01/23   Elgergawy, Leana Roe, MD  oxyCODONE (ROXICODONE) 5 MG immediate release tablet Take 1 tablet (5 mg total) by mouth every 4 (four) hours as needed for severe pain. 04/07/23   Rondel Baton, MD    Family History    Family History  Problem Relation Age of Onset   Stroke Father    He indicated that his father is deceased.  Social History    Social History   Socioeconomic History   Marital status: Single    Spouse name: Not on file   Number of children: Not on file   Years of education: Not on file   Highest education level: Not on file  Occupational History   Not on file  Tobacco Use   Smoking status: Former    Current packs/day: 0.00    Average packs/day: 1 pack/day for 20.0 years (20.0 ttl pk-yrs)    Types: Cigarettes    Start date: 09/21/1981    Quit date: 09/21/2001    Years since quitting: 21.7   Smokeless tobacco: Never  Vaping Use   Vaping status: Never Used  Substance and Sexual Activity   Alcohol use: Never   Drug use: Never   Sexual activity: Not on file  Other Topics Concern   Not on file  Social History  Narrative   Not on file   Social Determinants of Health   Financial Resource Strain: Not on file  Food Insecurity: Not on file  Transportation Needs: Not on file  Physical Activity: Not on file  Stress: Not on file  Social Connections: Not on file  Intimate Partner Violence: Not on file     Review of Systems    General:  No chills, fever, night sweats or weight changes.  Cardiovascular:  No chest pain, dyspnea on exertion, edema, orthopnea, palpitations, paroxysmal nocturnal dyspnea. Dermatological: No rash, lesions/masses Respiratory: No cough, dyspnea Urologic: No hematuria, dysuria Abdominal:   No nausea, vomiting, diarrhea, bright red blood per rectum, melena, or hematemesis Neurologic:  No visual changes, wkns, changes in mental status. All other systems reviewed and are otherwise negative except as noted above.  Physical Exam    VS:  There were no vitals taken for this visit. , BMI There is no height  or weight on file to calculate BMI. GEN: Well nourished, well developed, in no acute distress. HEENT: normal. Neck: Supple, no JVD, carotid bruits, or masses. Cardiac: RRR, no murmurs, rubs, or gallops. No clubbing, cyanosis, edema.  Radials/DP/PT 2+ and equal bilaterally.  Respiratory:  Respirations regular and unlabored, clear to auscultation bilaterally. GI: Soft, nontender, nondistended, BS + x 4. MS: no deformity or atrophy. Skin: warm and dry, no rash. Neuro:  Strength and sensation are intact. Psych: Normal affect.  Accessory Clinical Findings    Recent Labs: 12/27/2022: ALT 21 12/28/2022: BUN 14; Creatinine, Ser 0.82; Potassium 3.3; Sodium 137 12/29/2022: Hemoglobin 16.1; Platelets 260   Recent Lipid Panel    Component Value Date/Time   CHOL 150 12/16/2022 1700   TRIG 62 12/16/2022 1700   HDL 55 12/16/2022 1700   CHOLHDL 2.7 12/16/2022 1700   LDLCALC 82 12/16/2022 1700    No BP recorded.  {Refresh Note OR Click here to enter BP  :1}***    ECG personally  reviewed by me today- ***     Echocardiogram 06/10/2022  IMPRESSIONS     1. Left ventricular ejection fraction, by estimation, is 40 to 45%. The  left ventricle has mildly decreased function. The left ventricle  demonstrates global hypokinesis. There is moderate left ventricular  hypertrophy. Left ventricular diastolic  parameters are indeterminate.   2. Right ventricular systolic function is normal. The right ventricular  size is normal. There is normal pulmonary artery systolic pressure. The  estimated right ventricular systolic pressure is 23.2 mmHg.   3. Left atrial size was moderately dilated.   4. The mitral valve is normal in structure. No evidence of mitral valve  regurgitation. No evidence of mitral stenosis.   5. The aortic valve is tricuspid. Aortic valve regurgitation is not  visualized. No aortic stenosis is present.   6. The inferior vena cava is normal in size with greater than 50%  respiratory variability, suggesting right atrial pressure of 3 mmHg.   FINDINGS   Left Ventricle: Left ventricular ejection fraction, by estimation, is 40  to 45%. The left ventricle has mildly decreased function. The left  ventricle demonstrates global hypokinesis. Definity contrast agent was  given IV to delineate the left ventricular   endocardial borders. The left ventricular internal cavity size was normal  in size. There is moderate left ventricular hypertrophy. Left ventricular  diastolic parameters are indeterminate.   Right Ventricle: The right ventricular size is normal. No increase in  right ventricular wall thickness. Right ventricular systolic function is  normal. There is normal pulmonary artery systolic pressure. The tricuspid  regurgitant velocity is 1.95 m/s, and   with an assumed right atrial pressure of 8 mmHg, the estimated right  ventricular systolic pressure is 23.2 mmHg.   Left Atrium: Left atrial size was moderately dilated.   Right Atrium: Right atrial size  was normal in size.   Pericardium: There is no evidence of pericardial effusion.   Mitral Valve: The mitral valve is normal in structure. No evidence of  mitral valve regurgitation. No evidence of mitral valve stenosis.   Tricuspid Valve: The tricuspid valve is normal in structure. Tricuspid  valve regurgitation is trivial. No evidence of tricuspid stenosis.   Aortic Valve: The aortic valve is tricuspid. Aortic valve regurgitation is  not visualized. No aortic stenosis is present.   Pulmonic Valve: The pulmonic valve was normal in structure. Pulmonic valve  regurgitation is not visualized. No evidence of pulmonic stenosis.   Aorta: The  aortic root is normal in size and structure.   Venous: The inferior vena cava is normal in size with greater than 50%  respiratory variability, suggesting right atrial pressure of 3 mmHg.   IAS/Shunts: No atrial level shunt detected by color flow Doppler.       Assessment & Plan   1.  ***   Thomasene Ripple. Daneka Lantigua NP-C     06/15/2023, 10:06 AM [facility] [address] Office [phone] Fax [phone]    I spent***minutes examining this patient, reviewing medications, and using patient centered shared decision making involving her cardiac care.   I spent greater than 20 minutes reviewing her past medical history,  medications, and prior cardiac tests.

## 2023-06-16 MED ORDER — LOSARTAN POTASSIUM 25 MG PO TABS: 25.0000 mg | ORAL_TABLET | Freq: Every day | ORAL | 3 refills | Status: DC

## 2023-06-16 NOTE — Addendum Note (Signed)
Addended by: Alverda Skeans on: 06/16/2023 07:06 AM   Modules accepted: Orders

## 2023-06-17 ENCOUNTER — Ambulatory Visit: Payer: Medicaid Other | Admitting: General Practice

## 2023-06-28 NOTE — Progress Notes (Unsigned)
Cardiology Clinic Note   Patient Name: Riley Jefferson Date of Encounter: 06/28/2023  Primary Care Provider:  System, Provider Not In Primary Cardiologist:  Orbie Pyo, MD  Patient Profile    Riley Jefferson 62 year old male presents the clinic today for follow-up evaluation of his chronic combined systolic and diastolic CHF.  Past Medical History    Past Medical History:  Diagnosis Date   Atrial fibrillation (HCC)    Hypertension    No past surgical history on file.  Allergies  No Known Allergies  History of Present Illness    Riley Jefferson is a PMH of combined systolic and diastolic CHF, paroxysmal atrial fibrillation, CKD stage II, elevated BMI, and snoring.  Echocardiogram 2020) at [place] showed an EF of 40-45% echocardiogram 2023 showed an EF of 40-45%, cardiac PET CT 2022 in [place] was negative.  He was admitted with CHF exacerbation in November.  His troponins and BNP were normal.  He was started on diuresis and discharge.  He presented for follow-up in the outpatient clinic in November 2023.  He was noted to have dyspnea.  He was not able to start [place] or Entresto.  Social work consult was placed.  He followed up 12/23 and was noted to be disoriented.  He reported breathing difficulty.  A plan was made to start Jardiance and furosemide.  Follow-up lab work was also planned.  He reported snoring and endorsed daytime somnolence.  Sleep study was also planned.  He was seen in follow-up by Dr. Lynnette Caffey on 06/09/2023.  He had been off of his [place].  He reported that he never presented to perform sleep study.  He had been seen in the emergency department in June 2024 with nosebleed.  This was a result of his high blood pressure and being on apixaban.  ENT was consulted and managed.  He was discharged with plan to restart his apixaban.  He did not restart his apixaban.  He reported having trouble getting his medications including his  [place] and Entresto.  He was only taking furosemide and metoprolol.  He did note some shortness of breath.  This was intermittent.  His weight was stable.  He denied orthopnea.  Patient assistance referral was made.  He was restarted on losartan 25 mg daily.  His furosemide was increased to 40 mg twice daily.  BMP in 1 week was ordered.  His Eliquis was also restarted.  Follow-up was planned for 1-2 weeks.  He presents to the clinic today for follow-up evaluation and states***.  *** denies chest pain, shortness of breath, lower extremity edema, fatigue, palpitations, melena, hematuria, hemoptysis, diaphoresis, weakness, presyncope, syncope, orthopnea, and PND.  Chronic combined systolic and diastolic CHF-euvolemic.  Weight today***.  Breathing somewhat improved.  Seen in follow-up by Dr.Thukkani 06/09/2023.  His furosemide had been increased to 40 mg twice daily.  He had been off of his [place] and Sherryll Burger for several months.  He was started on losartan 25 mg daily. Increase losartan to 50 mg daily Continue furosemide, metoprolol Heart healthy low-sodium diet Increase physical activity as tolerated Daily weights-maintain weight log Lower extremity support stockings-[place] support stocking sheet given Order EMP  Paroxysmal atrial fibrillation-heart rate today***.  Reports compliance with apixaban.  Denies bleeding issues. Avoid triggers caffeine, chocolate, EtOH, dehydration etc. Continue apixaban, metoprolol  Daytime somnolence-order home sleep study STOP-BANG score***. Avoid supine sleeping Continue weight loss  CKD stage II-creatinine 0.82 on 12/28/2022.  Creatinine elevated to 1.42 on 10/13/2022. Maintain  p.o. hydration Order BMP  Disposition: Follow-up with Dr. Lovie Macadamia or APP in 1-2 months.  Home Medications    Prior to Admission medications   Medication Sig Start Date End Date Taking? Authorizing Provider  acetaminophen (TYLENOL) 325 MG tablet Take 2 tablets (650 mg total)  by mouth every 6 (six) hours as needed for mild pain (or Fever >/= 101). 06/10/22   Marguerita Merles Latif, DO  apixaban (ELIQUIS) 5 MG TABS tablet Take 1 tablet (5 mg total) by mouth 2 (two) times daily. 06/09/23   Orbie Pyo, MD  empagliflozin (JARDIANCE) 10 MG TABS tablet Take 1 tablet (10 mg total) by mouth daily. Patient not taking: Reported on 06/09/2023 12/29/22   Elgergawy, Leana Roe, MD  furosemide (LASIX) 40 MG tablet Take 1 tablet (40 mg total) by mouth 2 (two) times daily. 06/09/23   Orbie Pyo, MD  latanoprost (XALATAN) 0.005 % ophthalmic solution Place 1 drop into both eyes at bedtime. 12/19/22   [provider]  losartan (COZAAR) 25 MG tablet Take 1 tablet (25 mg total) by mouth daily. 06/09/23   Orbie Pyo, MD  metoprolol succinate (TOPROL-XL) 25 MG 24 hr tablet Take 0.5 tablets (12.5 mg total) by mouth daily. 01/01/23   Elgergawy, Leana Roe, MD  oxyCODONE (ROXICODONE) 5 MG immediate release tablet Take 1 tablet (5 mg total) by mouth every 4 (four) hours as needed for severe pain. 04/07/23   Rondel Baton, MD    Family History    Family History  Problem Relation Age of Onset   Stroke Father    He indicated that his father is deceased.  Social History    Social History   Socioeconomic History   Marital status: Single    Spouse name: Not on file   Number of children: Not on file   Years of education: Not on file   Highest education level: Not on file  Occupational History   Not on file  Tobacco Use   Smoking status: Former    Current packs/day: 0.00    Average packs/day: 1 pack/day for 20.0 years (20.0 ttl pk-yrs)    Types: Cigarettes    Start date: 09/21/1981    Quit date: 09/21/2001    Years since quitting: 21.7   Smokeless tobacco: Never  Vaping Use   Vaping status: Never Used  Substance and Sexual Activity   Alcohol use: Never   Drug use: Never   Sexual activity: Not on file  Other Topics Concern   Not on file  Social History  Narrative   Not on file   Social Determinants of Health   Financial Resource Strain: Not on file  Food Insecurity: Not on file  Transportation Needs: Not on file  Physical Activity: Not on file  Stress: Not on file  Social Connections: Not on file  Intimate Partner Violence: Not on file     Review of Systems    General:  No chills, fever, night sweats or weight changes.  Cardiovascular:  No chest pain, dyspnea on exertion, edema, orthopnea, palpitations, paroxysmal nocturnal dyspnea. Dermatological: No rash, lesions/masses Respiratory: No cough, dyspnea Urologic: No hematuria, dysuria Abdominal:   No nausea, vomiting, diarrhea, bright red blood per rectum, melena, or hematemesis Neurologic:  No visual changes, wkns, changes in mental status. All other systems reviewed and are otherwise negative except as noted above.  Physical Exam    VS:  There were no vitals taken for this visit. , BMI There is no height  or weight on file to calculate BMI. GEN: Well nourished, well developed, in no acute distress. HEENT: normal. Neck: Supple, no JVD, carotid bruits, or masses. Cardiac: RRR, no murmurs, rubs, or gallops. No clubbing, cyanosis, edema.  Radials/DP/PT 2+ and equal bilaterally.  Respiratory:  Respirations regular and unlabored, clear to auscultation bilaterally. GI: Soft, nontender, nondistended, BS + x 4. MS: no deformity or atrophy. Skin: warm and dry, no rash. Neuro:  Strength and sensation are intact. Psych: Normal affect.  Accessory Clinical Findings    Recent Labs: 12/27/2022: ALT 21 12/28/2022: BUN 14; Creatinine, Ser 0.82; Potassium 3.3; Sodium 137 12/29/2022: Hemoglobin 16.1; Platelets 260   Recent Lipid Panel    Component Value Date/Time   CHOL 150 12/16/2022 1700   TRIG 62 12/16/2022 1700   HDL 55 12/16/2022 1700   CHOLHDL 2.7 12/16/2022 1700   LDLCALC 82 12/16/2022 1700    No BP recorded.  {Refresh Note OR Click here to enter BP  :1}***    ECG personally  reviewed by me today- ***     Echocardiogram 06/10/2022  IMPRESSIONS     1. Left ventricular ejection fraction, by estimation, is 40 to 45%. The  left ventricle has mildly decreased function. The left ventricle  demonstrates global hypokinesis. There is moderate left ventricular  hypertrophy. Left ventricular diastolic  parameters are indeterminate.   2. Right ventricular systolic function is normal. The right ventricular  size is normal. There is normal pulmonary artery systolic pressure. The  estimated right ventricular systolic pressure is 23.2 mmHg.   3. Left atrial size was moderately dilated.   4. The mitral valve is normal in structure. No evidence of mitral valve  regurgitation. No evidence of mitral stenosis.   5. The aortic valve is tricuspid. Aortic valve regurgitation is not  visualized. No aortic stenosis is present.   6. The inferior vena cava is normal in size with greater than 50%  respiratory variability, suggesting right atrial pressure of 3 mmHg.   FINDINGS   Left Ventricle: Left ventricular ejection fraction, by estimation, is 40  to 45%. The left ventricle has mildly decreased function. The left  ventricle demonstrates global hypokinesis. Definity contrast agent was  given IV to delineate the left ventricular   endocardial borders. The left ventricular internal cavity size was normal  in size. There is moderate left ventricular hypertrophy. Left ventricular  diastolic parameters are indeterminate.   Right Ventricle: The right ventricular size is normal. No increase in  right ventricular wall thickness. Right ventricular systolic function is  normal. There is normal pulmonary artery systolic pressure. The tricuspid  regurgitant velocity is 1.95 m/s, and   with an assumed right atrial pressure of 8 mmHg, the estimated right  ventricular systolic pressure is 23.2 mmHg.   Left Atrium: Left atrial size was moderately dilated.   Right Atrium: Right atrial size  was normal in size.   Pericardium: There is no evidence of pericardial effusion.   Mitral Valve: The mitral valve is normal in structure. No evidence of  mitral valve regurgitation. No evidence of mitral valve stenosis.   Tricuspid Valve: The tricuspid valve is normal in structure. Tricuspid  valve regurgitation is trivial. No evidence of tricuspid stenosis.   Aortic Valve: The aortic valve is tricuspid. Aortic valve regurgitation is  not visualized. No aortic stenosis is present.   Pulmonic Valve: The pulmonic valve was normal in structure. Pulmonic valve  regurgitation is not visualized. No evidence of pulmonic stenosis.   Aorta: The  aortic root is normal in size and structure.   Venous: The inferior vena cava is normal in size with greater than 50%  respiratory variability, suggesting right atrial pressure of 3 mmHg.   IAS/Shunts: No atrial level shunt detected by color flow Doppler.       Assessment & Plan   1.  ***   Thomasene Ripple. Shayne Deerman NP-C     06/28/2023, 8:08 AM [facility] [address] Office [phone] Fax [phone]    I spent***minutes examining this patient, reviewing medications, and using patient centered shared decision making involving her cardiac care.   I spent greater than 20 minutes reviewing her past medical history,  medications, and prior cardiac tests.

## 2023-07-05 ENCOUNTER — Ambulatory Visit: Payer: Medicaid Other | Admitting: General Practice

## 2023-07-13 ENCOUNTER — Other Ambulatory Visit: Payer: Self-pay

## 2023-07-13 ENCOUNTER — Emergency Department (HOSPITAL_COMMUNITY): Payer: Medicaid Other

## 2023-07-13 ENCOUNTER — Emergency Department (HOSPITAL_COMMUNITY)
Admission: EM | Admit: 2023-07-13 | Discharge: 2023-07-13 | Disposition: A | Discharge status: Home / Self Care | Payer: Medicaid Other | Attending: Emergency Medicine | Admitting: Emergency Medicine

## 2023-07-13 DIAGNOSIS — R0602 Shortness of breath: Secondary | ICD-10-CM | POA: Insufficient documentation

## 2023-07-13 DIAGNOSIS — W19XXXA Unspecified fall, initial encounter: Secondary | ICD-10-CM | POA: Insufficient documentation

## 2023-07-13 DIAGNOSIS — I4891 Unspecified atrial fibrillation: Secondary | ICD-10-CM | POA: Insufficient documentation

## 2023-07-13 DIAGNOSIS — Z79899 Other long term (current) drug therapy: Secondary | ICD-10-CM | POA: Insufficient documentation

## 2023-07-13 DIAGNOSIS — I10 Essential (primary) hypertension: Secondary | ICD-10-CM | POA: Diagnosis not present

## 2023-07-13 DIAGNOSIS — Z7901 Long term (current) use of anticoagulants: Secondary | ICD-10-CM | POA: Diagnosis not present

## 2023-07-13 DIAGNOSIS — M25462 Effusion, left knee: Secondary | ICD-10-CM | POA: Diagnosis not present

## 2023-07-13 DIAGNOSIS — M7989 Other specified soft tissue disorders: Secondary | ICD-10-CM | POA: Diagnosis present

## 2023-07-13 LAB — BASIC METABOLIC PANEL
Anion gap: 9 (ref 5–15)
BUN: 16 mg/dL (ref 8–23)
CO2: 21 mmol/L — ABNORMAL LOW (ref 22–32)
Calcium: 9.4 mg/dL (ref 8.9–10.3)
Chloride: 105 mmol/L (ref 98–111)
Creatinine, Ser: 0.86 mg/dL (ref 0.61–1.24)
GFR, Estimated: >60 mL/min (ref >60)
Glucose, Bld: 97 mg/dL (ref 70–99)
Potassium: 4.2 mmol/L (ref 3.5–5.1)
Sodium: 135 mmol/L (ref 135–145)

## 2023-07-13 LAB — CBC WITH DIFFERENTIAL/PLATELET
Abs Immature Granulocytes: 0.01 10*3/uL (ref 0.00–0.07)
Basophils Absolute: 0.1 10*3/uL (ref 0.0–0.1)
Basophils Relative: 1 %
Eosinophils Absolute: 0.3 10*3/uL (ref 0.0–0.5)
Eosinophils Relative: 6 %
HCT: 47.3 % (ref 39.0–52.0)
Hemoglobin: 15.4 g/dL (ref 13.0–17.0)
Immature Granulocytes: 0 %
Lymphocytes Relative: 41 %
Lymphs Abs: 2.4 10*3/uL (ref 0.7–4.0)
MCH: 27.6 pg (ref 26.0–34.0)
MCHC: 32.6 g/dL (ref 30.0–36.0)
MCV: 84.9 fL (ref 80.0–100.0)
Monocytes Absolute: 0.5 10*3/uL (ref 0.1–1.0)
Monocytes Relative: 9 %
Neutro Abs: 2.5 10*3/uL (ref 1.7–7.7)
Neutrophils Relative %: 43 %
Platelets: 213 10*3/uL (ref 150–400)
RBC: 5.57 MIL/uL (ref 4.22–5.81)
RDW: 15.5 % (ref 11.5–15.5)
WBC: 5.8 10*3/uL (ref 4.0–10.5)
nRBC: 0 % (ref 0.0–0.2)

## 2023-07-13 LAB — TROPONIN I (HIGH SENSITIVITY)
Troponin I (High Sensitivity): 8 ng/L (ref <18)
Troponin I (High Sensitivity): 9 ng/L (ref <18)

## 2023-07-13 NOTE — Discharge Instructions (Addendum)
Please follow-up with your cardiologist, for your shortness of breath as well as your PCP.  Our findings today are unremarkable.  Additionally please follow-up with your orthopedic doctor, if you do not have an orthopedic doctor, see Dr. Carola Frost, as you have a knee effusion.  I placed you in a knee immobilizer, in case there is any soft tissue damage, and crutches, to help off bear weight.  You can take Tylenol and ibuprofen at this time, for symptomatic control

## 2023-07-13 NOTE — ED Provider Notes (Signed)
[place] EMERGENCY DEPARTMENT AT [facility] Provider Note   CSN: [id number] Arrival date & time: 07/13/23  8295     History {Add pertinent medical, surgical, social history, OB history to HPI:1} Chief Complaint  Patient presents with   Knee Pain    Pt. Reports that he dropped down onto his lt. Knee and is having pain and swelling, decreased movement.    He injured it on 07/07/1023   this knee has a knee replacement      Shortness of Breath    Riley [facility] is a 62 y.o. male.   Knee Pain Shortness of Breath      Home Medications Prior to Admission medications   Medication Sig Start Date End Date Taking? Authorizing Provider  acetaminophen (TYLENOL) 325 MG tablet Take 2 tablets (650 mg total) by mouth every 6 (six) hours as needed for mild pain (or Fever >/= 101). 06/10/22   Marguerita Merles Latif, DO  apixaban (ELIQUIS) 5 MG TABS tablet Take 1 tablet (5 mg total) by mouth 2 (two) times daily. 06/09/23   Orbie Pyo, MD  empagliflozin (JARDIANCE) 10 MG TABS tablet Take 1 tablet (10 mg total) by mouth daily. Patient not taking: Reported on 06/09/2023 12/29/22   Elgergawy, Leana Roe, MD  furosemide (LASIX) 40 MG tablet Take 1 tablet (40 mg total) by mouth 2 (two) times daily. 06/09/23   Orbie Pyo, MD  latanoprost (XALATAN) 0.005 % ophthalmic solution Place 1 drop into both eyes at bedtime. 12/19/22   [provider]  losartan (COZAAR) 25 MG tablet Take 1 tablet (25 mg total) by mouth daily. 06/16/23   Orbie Pyo, MD  metoprolol succinate (TOPROL-XL) 25 MG 24 hr tablet Take 0.5 tablets (12.5 mg total) by mouth daily. 01/01/23   Elgergawy, Leana Roe, MD  oxyCODONE (ROXICODONE) 5 MG immediate release tablet Take 1 tablet (5 mg total) by mouth every 4 (four) hours as needed for severe pain. 04/07/23   Rondel Baton, MD      Allergies    Patient has no known allergies.    Review of Systems   Review of Systems  Respiratory:  Positive for  shortness of breath.     Physical Exam Updated Vital Signs BP (!) 147/80 (BP Location: Right Arm)   Pulse 83   Temp 98.4 F (36.9 C)   Resp 16   Ht 6\' 4"  (1.93 m)   Wt 133.8 kg   SpO2 94%   BMI 35.91 kg/m  Physical Exam  ED Results / Procedures / Treatments   Labs (all labs ordered are listed, but only abnormal results are displayed) Labs Reviewed  BASIC METABOLIC PANEL - Abnormal; Notable for the following components:      Result Value   CO2 21 (*)    All other components within normal limits  CBC WITH DIFFERENTIAL/PLATELET  TROPONIN I (HIGH SENSITIVITY)  TROPONIN I (HIGH SENSITIVITY)    EKG EKG Interpretation Date/Time:  Tuesday July 13 2023 15:54:02 EST Ventricular Rate:  73 PR Interval:    QRS Duration:  130 QT Interval:  418 QTC Calculation: 460 R Axis:   -7  Text Interpretation: Atrial fibrillation Left ventricular hypertrophy with QRS widening ( R in aVL , Sokolow-Lyon , Cornell product ) Cannot rule out Septal infarct , age undetermined Abnormal ECG When compared with ECG of 09-Jun-2023 15:05, PREVIOUS ECG IS PRESENT No significant change since last tracing Confirmed by Riley [facility] ([id number]) on 07/13/2023 4:16:07 PM  Radiology  DG Chest 2 View Result Date: 07/13/2023 CLINICAL DATA:  Shortness of breath EXAM: CHEST - 2 VIEW COMPARISON:  12/29/2022 FINDINGS: The heart size and mediastinal contours are within normal limits. Both lungs are clear. The visualized skeletal structures are unremarkable. IMPRESSION: No active cardiopulmonary disease. Electronically Signed   By: Riley [facility] M.D.   On: 07/13/2023 17:39   DG Knee Complete 4 Views Left Result Date: 07/13/2023 CLINICAL DATA:  Fall.  Left knee pain and swelling. EXAM: LEFT KNEE - COMPLETE 4+ VIEW COMPARISON:  10/21/2021 FINDINGS: The distal end of an antegrade femoral intramedullary nail again noted. No evidence for an acute fracture involving the bony anatomy of the knee. No dislocation. Loss of  joint space noted medial compartment with mild hypertrophic spurring in all 3 compartments. Meniscal calcification evident. Riley [facility] to moderate joint effusion IMPRESSION: 1. No acute bony findings. 2. Riley [facility] to moderate joint effusion. 3. Tricompartmental degenerative changes. Electronically Signed   By: Riley [facility] M.D.   On: 07/13/2023 14:18    Procedures Procedures  {Document cardiac monitor, telemetry assessment procedure when appropriate:1}  Medications Ordered in ED Medications - No data to display  ED Course/ Medical Decision Making/ A&P   {   Click here for ABCD2, HEART and other calculatorsREFRESH Note before signing :1}                              Medical Decision Making Amount and/or Complexity of Data Reviewed Labs: ordered. Radiology: ordered.   ***  {Document critical care time when appropriate:1} {Document review of labs and clinical decision tools ie heart score, Chads2Vasc2 etc:1}  {Document your independent review of radiology images, and any outside records:1} {Document your discussion with family members, caretakers, and with consultants:1} {Document social determinants of health affecting pt's care:1} {Document your decision making why or why not admission, treatments were needed:1} Final Clinical Impression(s) / ED Diagnoses Final diagnoses:  None    Rx / DC Orders ED Discharge Orders     None

## 2023-07-13 NOTE — Progress Notes (Signed)
Orthopedic Tech Progress Note Patient Details:  Riley [facility] [birth year] [id number]  Ortho Devices Type of Ortho Device: Crutches, Knee Immobilizer Ortho Device/Splint Location: LLE Ortho Device/Splint Interventions: Ordered, Application, Adjustment   Post Interventions Patient Tolerated: Well Instructions Provided: Care of device, Adjustment of device, Poper ambulation with device  [name] 07/13/2023, 10:49 PM

## 2023-07-13 NOTE — ED Notes (Signed)
Ice pack placed on rt knee

## 2023-07-13 NOTE — ED Notes (Signed)
Pts vitals were taken and EMT noticed pt seemed SOB and HR was fluctuating. EMT advised triage RN, Morrie Sheldon.

## 2023-07-13 NOTE — ED Triage Notes (Signed)
Pt. Was at his cousins and dropped onto the floor with his knees.  Lt knee is swollen, painful and decrease mobility.  Pt. Has a knee replacement in the lt.knee

## 2023-07-13 NOTE — ED Notes (Signed)
Unable to collect labs at rn aware

## 2023-07-13 NOTE — ED Notes (Signed)
Pt stating that he had ShOB last night and this morning, now states "I am alright". Brought back to triage for reevaluation, pt stating "I just need my knee looked at". Explained need for EKG and lab work due to now having a new complaint. Pt states understanding for need for testing.

## 2023-08-01 NOTE — Progress Notes (Signed)
 Cardiology Clinic Note   Patient Name: Riley [facility] Date of Encounter: 08/04/2023  Primary Care Provider:  System, Provider Not In Primary Cardiologist:  Arun K Thukkani, MD  Patient Profile    Riley [facility] 63 year old male presents the clinic today for follow-up evaluation of his chronic combined systolic and diastolic CHF.  Past Medical History    Past Medical History:  Diagnosis Date   Atrial fibrillation (HCC)    Hypertension    History reviewed. No pertinent surgical history.  Allergies  No Known Allergies  History of Present Illness    Riley [facility] is a PMH of combined systolic and diastolic CHF, paroxysmal atrial fibrillation, CKD stage II, elevated BMI, and snoring.  Echocardiogram 2020) at [place]  showed an EF of 40-45% echocardiogram 2023 showed an EF of 40-45%, cardiac PET CT 2022 in [facility] [place]  was negative.  He was admitted with CHF exacerbation in November.  His troponins and BNP were normal.  He was started on diuresis and discharge.  He presented for follow-up in the outpatient clinic in November 2023.  He was noted to have dyspnea.  He was not able to start Farxiga or Entresto .  Social work consult was placed.  He followed up 12/23 and was noted to be disoriented.  He reported breathing difficulty.  A plan was made to start Jardiance  and furosemide .  Follow-up lab work was also planned.  He reported snoring and endorsed daytime somnolence.  Sleep study was also planned.  He was seen in follow-up by Dr. Wendel on 06/09/2023.  He had been off of his Jardiance  and Entresto .  He reported that he never presented to perform sleep study.  He had been seen in the emergency department in June 2024 with nosebleed.  This was a result of his high blood pressure and being on apixaban .  ENT was consulted and managed.  He was discharged with plan to restart his apixaban .  He did not restart his apixaban .  He reported having trouble getting his medications  including his Jardiance  and Entresto .  He was only taking furosemide  and metoprolol .  He did note some shortness of breath.  This was intermittent.  His weight was stable.  He denied orthopnea.  Patient assistance referral was made.  He was restarted on losartan  25 mg daily.  His furosemide  was increased to 40 mg twice daily.  BMP in 1 week was ordered.  His Eliquis  was also restarted.  Follow-up was planned for 1-2 weeks.  He presents to the clinic today for follow-up evaluation and states he has not been work since October last year.  He reports that he has not been taking his apixaban  or his Jardiance .  His weight today is improved at 283 pounds.  This is down from 285 pounds.  His blood pressure initially today is 156/98 and on recheck it is 112/56.  He is very sedentary.  We reviewed the importance of medication compliance.  I will put in a social work consult.  I am limited in GDMT due to blood pressure.  I will repeat a BMP today and plan follow-up in 2 to 3 months.  I will also order home sleep study/WatchPAT.  Today he denies chest pain, increased shortness of breath, lower extremity edema, fatigue, palpitations, melena, hematuria, hemoptysis, diaphoresis, weakness, presyncope, syncope, orthopnea, and PND.   Home Medications    Prior to Admission medications   Medication Sig Start Date End Date Taking? Authorizing Provider  acetaminophen  (TYLENOL ) 325 MG tablet Take 2  tablets (650 mg total) by mouth every 6 (six) hours as needed for mild pain (or Fever >/= 101). 06/10/22   Sheikh, Alejandro Latif, DO  apixaban  (ELIQUIS ) 5 MG TABS tablet Take 1 tablet (5 mg total) by mouth 2 (two) times daily. 06/09/23   Thukkani, Arun K, MD  empagliflozin  (JARDIANCE ) 10 MG TABS tablet Take 1 tablet (10 mg total) by mouth daily. Patient not taking: Reported on 06/09/2023 12/29/22   Elgergawy, Brayton RAMAN, MD  furosemide  (LASIX ) 40 MG tablet Take 1 tablet (40 mg total) by mouth 2 (two) times daily. 06/09/23   Thukkani,  Arun K, MD  latanoprost  (XALATAN ) 0.005 % ophthalmic solution Place 1 drop into both eyes at bedtime. 12/19/22   [provider]  losartan  (COZAAR ) 25 MG tablet Take 1 tablet (25 mg total) by mouth daily. 06/09/23   Thukkani, Arun K, MD  metoprolol  succinate (TOPROL -XL) 25 MG 24 hr tablet Take 0.5 tablets (12.5 mg total) by mouth daily. 01/01/23   Elgergawy, Brayton RAMAN, MD  oxyCODONE  (ROXICODONE ) 5 MG immediate release tablet Take 1 tablet (5 mg total) by mouth every 4 (four) hours as needed for severe pain. 04/07/23   Yolande Lamar BROCKS, MD    Family History    Family History  Problem Relation Age of Onset   Stroke Father    He indicated that his father is deceased.  Social History    Social History   Socioeconomic History   Marital status: Single    Spouse name: Not on file   Number of children: Not on file   Years of education: Not on file   Highest education level: Not on file  Occupational History   Not on file  Tobacco Use   Smoking status: Former    Current packs/day: 0.00    Average packs/day: 1 pack/day for 20.0 years (20.0 ttl pk-yrs)    Types: Cigarettes    Start date: 09/21/1981    Quit date: 09/21/2001    Years since quitting: 21.8   Smokeless tobacco: Never  Vaping Use   Vaping status: Never Used  Substance and Sexual Activity   Alcohol use: Never   Drug use: Never   Sexual activity: Not on file  Other Topics Concern   Not on file  Social History Narrative   Not on file   Social Drivers of Health   Financial Resource Strain: Not on file  Food Insecurity: Not on file  Transportation Needs: Not on file  Physical Activity: Not on file  Stress: Not on file  Social Connections: Not on file  Intimate Partner Violence: Not on file     Review of Systems    General:  No chills, fever, night sweats or weight changes.  Cardiovascular:  No chest pain, dyspnea on exertion, edema, orthopnea, palpitations, paroxysmal nocturnal dyspnea. Dermatological: No  rash, lesions/masses Respiratory: No cough, dyspnea Urologic: No hematuria, dysuria Abdominal:   No nausea, vomiting, diarrhea, bright red blood per rectum, melena, or hematemesis Neurologic:  No visual changes, wkns, changes in mental status. All other systems reviewed and are otherwise negative except as noted above.  Physical Exam    VS:  BP (!) 112/56   Pulse 61   Ht 6' 4 (1.93 m)   Wt 283 lb 9.6 oz (128.6 kg)   SpO2 98%   BMI 34.52 kg/m  , BMI Body mass index is 34.52 kg/m. GEN: Well nourished, well developed, in no acute distress. HEENT: normal. Neck: Supple, no JVD, carotid bruits,  or masses. Cardiac: RRR, no murmurs, rubs, or gallops. No clubbing, cyanosis, edema.  Radials/DP/PT 2+ and equal bilaterally.  Respiratory:  Respirations regular and unlabored, clear to auscultation bilaterally. GI: Soft, nontender, nondistended, BS + x 4. MS: no deformity or atrophy. Skin: warm and dry, no rash. Neuro:  Strength and sensation are intact. Psych: Normal affect.  Accessory Clinical Findings    Recent Labs: 12/27/2022: ALT 21 07/13/2023: BUN 16; Creatinine, Ser 0.86; Hemoglobin 15.4; Platelets 213; Potassium 4.2; Sodium 135   Recent Lipid Panel    Component Value Date/Time   CHOL 150 12/16/2022 1700   TRIG 62 12/16/2022 1700   HDL 55 12/16/2022 1700   CHOLHDL 2.7 12/16/2022 1700   LDLCALC 82 12/16/2022 1700         ECG personally reviewed by me today- none today.      Echocardiogram 06/10/2022  IMPRESSIONS     1. Left ventricular ejection fraction, by estimation, is 40 to 45%. The  left ventricle has mildly decreased function. The left ventricle  demonstrates global hypokinesis. There is moderate left ventricular  hypertrophy. Left ventricular diastolic  parameters are indeterminate.   2. Right ventricular systolic function is normal. The right ventricular  size is normal. There is normal pulmonary artery systolic pressure. The  estimated right ventricular  systolic pressure is 23.2 mmHg.   3. Left atrial size was moderately dilated.   4. The mitral valve is normal in structure. No evidence of mitral valve  regurgitation. No evidence of mitral stenosis.   5. The aortic valve is tricuspid. Aortic valve regurgitation is not  visualized. No aortic stenosis is present.   6. The inferior vena cava is normal in size with greater than 50%  respiratory variability, suggesting right atrial pressure of 3 mmHg.   FINDINGS   Left Ventricle: Left ventricular ejection fraction, by estimation, is 40  to 45%. The left ventricle has mildly decreased function. The left  ventricle demonstrates global hypokinesis. Definity  contrast agent was  given IV to delineate the left ventricular   endocardial borders. The left ventricular internal cavity size was normal  in size. There is moderate left ventricular hypertrophy. Left ventricular  diastolic parameters are indeterminate.   Right Ventricle: The right ventricular size is normal. No increase in  right ventricular wall thickness. Right ventricular systolic function is  normal. There is normal pulmonary artery systolic pressure. The tricuspid  regurgitant velocity is 1.95 m/s, and   with an assumed right atrial pressure of 8 mmHg, the estimated right  ventricular systolic pressure is 23.2 mmHg.   Left Atrium: Left atrial size was moderately dilated.   Right Atrium: Right atrial size was normal in size.   Pericardium: There is no evidence of pericardial effusion.   Mitral Valve: The mitral valve is normal in structure. No evidence of  mitral valve regurgitation. No evidence of mitral valve stenosis.   Tricuspid Valve: The tricuspid valve is normal in structure. Tricuspid  valve regurgitation is trivial. No evidence of tricuspid stenosis.   Aortic Valve: The aortic valve is tricuspid. Aortic valve regurgitation is  not visualized. No aortic stenosis is present.   Pulmonic Valve: The pulmonic valve was  normal in structure. Pulmonic valve  regurgitation is not visualized. No evidence of pulmonic stenosis.   Aorta: The aortic root is normal in size and structure.   Venous: The inferior vena cava is normal in size with greater than 50%  respiratory variability, suggesting right atrial pressure of 3 mmHg.  IAS/Shunts: No atrial level shunt detected by color flow Doppler.       Assessment & Plan   1.  Chronic combined systolic and diastolic CHF-euvolemic.  Weight today 283.6.  Breathing somewhat stable.  Seen in follow-up by Dr.Thukkani 06/09/2023.  His furosemide  had been increased to 40 mg twice daily.  He had been off of his Jardiance  and Entresto  for several months.  He was started on losartan  25 mg daily.  Today he continues to report noncompliance with Jardiance  and apixaban  Continue losartan  to 25 mg daily Continue furosemide , metoprolol  Heart healthy low-sodium diet Increase physical activity as tolerated-recommended initiating walking regularly through the day. Daily weights-maintain weight log Lower extremity support stockings Order BMP  Paroxysmal atrial fibrillation-heart rate today 61.  Reports none compliance with apixaban .  Denies bleeding issues.  Will attempt to give samples. Avoid triggers caffeine, chocolate, EtOH, dehydration etc. Continue apixaban , metoprolol  Social consult for medication affordability  Daytime somnolence-order home sleep study STOP-BANG score 7. Avoid supine sleeping Continue weight loss Home sleep study.  CKD stage II-creatinine 0.82 on 12/28/2022.  Creatinine elevated to 1.42 on 10/13/2022. Maintain p.o. hydration Order BMP  SBH- Has not worked since Oct 2024.  Social work consult  Disposition: Follow-up with Dr. Wendel or APP in 2 months.   Josefa HERO. Rainy Rothman NP-C     08/04/2023, 9:01 AM [facility] [address] Office [phone] Fax [phone]    I spent 15 minutes examining this  patient, reviewing medications, and using patient centered shared decision making involving her cardiac care.   I spent greater than 20 minutes reviewing her past medical history,  medications, and prior cardiac tests.

## 2023-08-04 ENCOUNTER — Encounter: Payer: Self-pay | Admitting: General Practice

## 2023-08-04 ENCOUNTER — Ambulatory Visit: Payer: Medicaid Other | Attending: General Practice | Admitting: General Practice

## 2023-08-04 VITALS — BP 112/56 | HR 61 | Ht 76.0 in | Wt 283.6 lb

## 2023-08-04 DIAGNOSIS — R400 Somnolence: Secondary | ICD-10-CM | POA: Diagnosis not present

## 2023-08-04 DIAGNOSIS — I480 Paroxysmal atrial fibrillation: Secondary | ICD-10-CM | POA: Diagnosis not present

## 2023-08-04 DIAGNOSIS — Z79899 Other long term (current) drug therapy: Secondary | ICD-10-CM

## 2023-08-04 DIAGNOSIS — I5042 Chronic combined systolic (congestive) and diastolic (congestive) heart failure: Secondary | ICD-10-CM | POA: Diagnosis not present

## 2023-08-04 DIAGNOSIS — N182 Chronic kidney disease, stage 2 (mild): Secondary | ICD-10-CM | POA: Diagnosis not present

## 2023-08-04 NOTE — Patient Instructions (Signed)
 Medication Instructions:  The current medical regimen is effective;  continue present plan and medications as directed. Please refer to the Current Medication list given to you today.  *If you need a refill on your cardiac medications before your next appointment, please call your pharmacy*  Lab Work: BMET TODAY If you have labs (blood work) drawn today and your tests are completely normal, you will receive your results only by:  MyChart Message (if you have MyChart) OR  A paper copy in the mail If you have any lab test that is abnormal or we need to change your treatment, we will call you to review the results.  Other Instructions GO TO MEDICARE.GOV TO SIGN UP FOR MEDICATION ASSISTANCE MAKE SURE TO WALK 2-3 MINUTES THREE TIMES DAILY PLEASE READ AND FOLLOW ATTACHED LOW SODIUM DIET PLEASE PURCHASE AND WEAR COMPRESSION STOCKINGS   Follow-Up: At [facility], you and your health needs are our priority.  As part of our continuing mission to provide you with exceptional heart care, we have created designated Provider Care Teams.  These Care Teams include your primary Cardiologist (physician) and Advanced Practice Providers (APPs -  Physician Assistants and Nurse Practitioners) who all work together to provide you with the care you need, when you need it.  Your next appointment:   2 month(s)  Provider:   Josefa Beauvais, FNP-C     Cooking With Less Salt Cooking with less salt is one way to reduce the amount of salt (sodium) you get from food. Most people should have less than 2,300 milligrams (mg) of sodium each day. If you have high blood pressure (hypertension), you may need to limit your sodium to 1,500 mg each day. Follow the tips below to help reduce your sodium intake. What are tips for eating less sodium? Reading food labels  Check the food label before buying or using packaged ingredients. Always check the label for the serving size and sodium content. Choose products with  less than 140 mg of sodium per serving. Check the % Daily Value column to see what percent of the daily recommended amount of sodium is in one serving of the product. Foods with 5% or less are low in sodium. Foods with 20% or more are high in sodium. Do not choose foods that have salt as one of the first three ingredients on the ingredients list. Always check how much sodium is in a product, even if the label says unsalted or no salt added. Shopping Buy sodium-free or low-sodium products. Look for these words: Low-sodium. Sodium-free. Reduced-sodium. No salt added. Unsalted. Buy fresh or frozen foods without sauces or additives. Cooking Instead of salt, use herbs, seasonings without salt, and spices. Use sodium-free baking soda. Grill, braise, or roast foods to add flavor with less salt. Do not add salt to pasta, rice, or hot cereals. Drain and rinse canned vegetables, beans, and meat before use. Do not add salt when cooking sweets and desserts. Cook with low-sodium ingredients. Meal planning The sodium in bread can add up. Try to plan meals with other grains. These may include whole oats, quinoa, whole wheat pasta, and other whole grains that do not have sodium added to them. What foods are high in sodium? Vegetables Regular canned vegetables, except low-sodium or reduced-sodium items. Sauerkraut, pickled vegetables, and relishes. Olives. French fries. Onion rings. Regular canned tomato sauce and paste. Regular tomato and vegetable juice. Frozen vegetables in sauces. Grains Instant hot cereals. Bread stuffing, pancake, and biscuit mixes. Croutons. Seasoned rice  or pasta mixes. Noodle soup cups. Boxed or frozen macaroni and cheese. Regular salted crackers. Self-rising flour. Rolls. Bagels. Flour tortillas and wraps. Meats and other proteins Meat or fish that is salted, canned, smoked, cured, spiced, or pickled. Precooked or cured meat, such as sausages or meat loaves. [place]. Ham.  Pepperoni. Hot dogs. Corned beef. Chipped beef. Salt pork. Jerky. Pickled herring, anchovies, and sardines. Regular canned tuna. Salted nuts. Dairy Processed cheese and cheese spreads. Hard cheeses. Cheese curds. Blue cheese. Feta cheese. String cheese. Regular cottage cheese. Buttermilk. Canned milk. The items listed above may not be a full list of foods high in sodium. Talk to a dietitian to learn more. What foods are low in sodium? Fruits Fresh, frozen, or canned fruit with no sauce added. Fruit juice. Vegetables Fresh or frozen vegetables with no sauce added. No salt added canned vegetables. No salt added tomato sauce and paste. Low-sodium or reduced-sodium tomato and vegetable juice. Grains Noodles, pasta, quinoa, rice. Shredded or puffed wheat or puffed rice. Regular or quick oats (not instant). Low-sodium crackers. Low-sodium bread. Whole grain bread and whole grain pasta. Unsalted popcorn. Meats and other proteins Fresh or frozen whole meats, poultry that has not been injected with sodium, and fish with no sauce added. Unsalted nuts. Dried peas, beans, and lentils without added salt. Unsalted canned beans. Eggs. Unsalted nut butters. Low-sodium canned tuna or chicken. Dairy Milk. Soy milk. Yogurt. Low-sodium cheeses, such as Swiss, Monterey Jack, [place], and ricotta. Sherbet or ice cream (keep to  cup per serving). Cream cheese. Fats and oils Unsalted butter or margarine. Other foods Homemade pudding. Sodium-free baking soda and baking powder. Herbs and spices. Low-sodium seasoning mixes. Beverages Coffee and tea. Carbonated beverages. The items listed above may not be a full list of foods low in sodium. Talk to a dietitian to learn more. What are some salt alternatives when cooking? Herbs, seasonings, and spices can be used instead of salt to flavor your food. Herbs should be fresh or dried. Do not choose packaged mixes. Next to the name of the herb, spice, or seasoning below  are some foods you can pair it with. Herbs Bay leaves - Soups, meat and vegetable dishes, and spaghetti sauce. Basil - [employer], soups, pasta, and fish dishes. Cilantro - Meat, poultry, and vegetable dishes. Chili powder - Marinades and Mexican dishes. Chives - Salad dressings and potato dishes. Cumin - Mexican dishes, couscous, and meat dishes. Dill - Fish dishes, sauces, and salads. Fennel - Meat and vegetable dishes, breads, and cookies. Garlic (do not use garlic salt) - Italian dishes, meat dishes, salad dressings, and sauces. Marjoram - Soups, potato dishes, and meat dishes. Oregano - Pizza and spaghetti sauce. Parsley - Salads, soups, pasta, and meat dishes. Rosemary - Italian dishes, salad dressings, soups, and red meats. Saffron - Fish dishes, pasta, and some poultry dishes. Sage - Stuffings and sauces. Tarragon - Fish and whole foods. Thyme - Stuffing, meat, and fish dishes. Seasonings Lemon juice - Fish dishes, poultry dishes, vegetables, and salads. Vinegar - Salad dressings, vegetables, and fish dishes. Spices Cinnamon - Sweet dishes, such as cakes, cookies, and puddings. Cloves - Gingerbread, puddings, and marinades for meats. Curry - Vegetable dishes, fish and poultry dishes, and stir-fry dishes. Ginger - Vegetable dishes, fish dishes, and stir-fry dishes. Nutmeg - Pasta, vegetables, poultry, fish dishes, and custard. This information is not intended to replace advice given to you by your health care provider. Make sure you discuss any questions you have with  your health care provider. Document Revised: 08/06/2022 Document Reviewed: 07/30/2022 Elsevier Patient Education  2024 [employer].

## 2023-08-05 LAB — BASIC METABOLIC PANEL
BUN/Creatinine Ratio: 13 (ref 10–24)
BUN: 13 mg/dL (ref 8–27)
CO2: 22 mmol/L (ref 20–29)
Calcium: 9.4 mg/dL (ref 8.6–10.2)
Chloride: 104 mmol/L (ref 96–106)
Creatinine, Ser: 0.97 mg/dL (ref 0.76–1.27)
Glucose: 90 mg/dL (ref 70–99)
Potassium: 4.5 mmol/L (ref 3.5–5.2)
Sodium: 142 mmol/L (ref 134–144)
eGFR: 87 mL/min/{1.73_m2} (ref >59)

## 2023-09-29 NOTE — Progress Notes (Signed)
 Cardiology Clinic Note   Patient Name: Kreed Kauffman Date of Encounter: 10/01/2023  Primary Care Provider:  System, Provider Not In Primary Cardiologist:  Orbie Pyo, MD  Patient Profile    Cohl Behrens 63 year old male presents the clinic today for follow-up evaluation of his chronic combined systolic and diastolic CHF.  Past Medical History    Past Medical History:  Diagnosis Date   Atrial fibrillation (HCC)    Hypertension    History reviewed. No pertinent surgical history.  Allergies  No Known Allergies  History of Present Illness    Chevelle Durr is a PMH of combined systolic and diastolic CHF, paroxysmal atrial fibrillation, CKD stage II, elevated BMI, and snoring.  Echocardiogram 2020) at [place] showed an EF of 40-45% echocardiogram 2023 showed an EF of 40-45%, cardiac PET CT 2022 in [place] was negative.  He was admitted with CHF exacerbation in November.  His troponins and BNP were normal.  He was started on diuresis and discharge.  He presented for follow-up in the outpatient clinic in November 2023.  He was noted to have dyspnea.  He was not able to start [place] or Entresto.  Social work consult was placed.  He followed up 12/23 and was noted to be disoriented.  He reported breathing difficulty.  A plan was made to start Jardiance and furosemide.  Follow-up lab work was also planned.  He reported snoring and endorsed daytime somnolence.  Sleep study was also planned.  He was seen in follow-up by Dr. Lynnette Caffey on 06/09/2023.  He had been off of his [place].  He reported that he never presented to perform sleep study.  He had been seen in the emergency department in June 2024 with nosebleed.  This was a result of his high blood pressure and being on apixaban.  ENT was consulted and managed.  He was discharged with plan to restart his apixaban.  He did not restart his apixaban.  He reported having trouble getting his medications  including his [place] and Entresto.  He was only taking furosemide and metoprolol.  He did note some shortness of breath.  This was intermittent.  His weight was stable.  He denied orthopnea.  Patient assistance referral was made.  He was restarted on losartan 25 mg daily.  His furosemide was increased to 40 mg twice daily.  BMP in 1 week was ordered.  His Eliquis was also restarted.  Follow-up was planned for 1-2 weeks.  He presented to the clinic 08/04/23 for follow-up evaluation and stated he had not been working since October last year.  He reported that he had not been taking his apixaban or his Jardiance.  His weight was improved at 283 pounds.  This was down from 285 pounds.  His blood pressure initially was 156/98 and on recheck it was 112/56.  He was very sedentary.  We reviewed the importance of medication compliance.  I  put in a social work consult.  I was unable to uptitrate GDMT due to blood pressure.  I planned repeat  BMP  and planned  follow-up in 2 to 3 months.  I will also order home sleep study/WatchPAT.  Sleep study was not completed.  BMP showed stable renal function and electrolytes.  He presents to the clinic today for follow-up evaluation and states he is worried about his atrial fibrillation.  He is somnolent in exam room today.  He is easily aroused and answers questions appropriately.  We reviewed his atrial  fibrillation.  He is rate controlled today in the 80s.  His blood pressure initially is 145/90 and on recheck is 134/72.  He reports intermittent compliance with his medication.  He notes that he took his metoprolol and losartan today.  He reports that he has run out of Eliquis.  We reviewed the social work consult that was previously put in.  He denies contact with social work.  I will give him samples of Eliquis, reorder social work consult, order CBC, BMP, BNP, and encouraged medication compliance.  He expressed understanding.  I will plan follow-up in 2 to 3 months.  Today  he denies chest pain, increased shortness of breath, lower extremity edema, fatigue, palpitations, melena, hematuria, hemoptysis, diaphoresis, weakness, presyncope, syncope, orthopnea, and PND.     Home Medications    Prior to Admission medications   Medication Sig Start Date End Date Taking? Authorizing Provider  acetaminophen (TYLENOL) 325 MG tablet Take 2 tablets (650 mg total) by mouth every 6 (six) hours as needed for mild pain (or Fever >/= 101). 06/10/22   Marguerita Merles Latif, DO  apixaban (ELIQUIS) 5 MG TABS tablet Take 1 tablet (5 mg total) by mouth 2 (two) times daily. 06/09/23   Orbie Pyo, MD  empagliflozin (JARDIANCE) 10 MG TABS tablet Take 1 tablet (10 mg total) by mouth daily. Patient not taking: Reported on 06/09/2023 12/29/22   Elgergawy, Leana Roe, MD  furosemide (LASIX) 40 MG tablet Take 1 tablet (40 mg total) by mouth 2 (two) times daily. 06/09/23   Orbie Pyo, MD  latanoprost (XALATAN) 0.005 % ophthalmic solution Place 1 drop into both eyes at bedtime. 12/19/22   [provider]  losartan (COZAAR) 25 MG tablet Take 1 tablet (25 mg total) by mouth daily. 06/09/23   Orbie Pyo, MD  metoprolol succinate (TOPROL-XL) 25 MG 24 hr tablet Take 0.5 tablets (12.5 mg total) by mouth daily. 01/01/23   Elgergawy, Leana Roe, MD  oxyCODONE (ROXICODONE) 5 MG immediate release tablet Take 1 tablet (5 mg total) by mouth every 4 (four) hours as needed for severe pain. 04/07/23   Rondel Baton, MD    Family History    Family History  Problem Relation Age of Onset   Stroke Father    He indicated that his father is deceased.  Social History    Social History   Socioeconomic History   Marital status: Single    Spouse name: Not on file   Number of children: Not on file   Years of education: Not on file   Highest education level: Not on file  Occupational History   Not on file  Tobacco Use   Smoking status: Former    Current packs/day: 0.00    Average  packs/day: 1 pack/day for 20.0 years (20.0 ttl pk-yrs)    Types: Cigarettes    Start date: 09/21/1981    Quit date: 09/21/2001    Years since quitting: 22.0   Smokeless tobacco: Never  Vaping Use   Vaping status: Never Used  Substance and Sexual Activity   Alcohol use: Never   Drug use: Never   Sexual activity: Not Currently    Partners: Female    Comment: Single  Other Topics Concern   Not on file  Social History Narrative   Not on file   Social Drivers of Health   Financial Resource Strain: Not on file  Food Insecurity: Not on file  Transportation Needs: Not on file  Physical Activity: Not  on file  Stress: Not on file  Social Connections: Not on file  Intimate Partner Violence: Not on file     Review of Systems    General:  No chills, fever, night sweats or weight changes.  Cardiovascular:  No chest pain, dyspnea on exertion, edema, orthopnea, palpitations, paroxysmal nocturnal dyspnea. Dermatological: No rash, lesions/masses Respiratory: No cough, dyspnea Urologic: No hematuria, dysuria Abdominal:   No nausea, vomiting, diarrhea, bright red blood per rectum, melena, or hematemesis Neurologic:  No visual changes, wkns, changes in mental status. All other systems reviewed and are otherwise negative except as noted above.  Physical Exam    VS:  BP 134/72   Pulse 82   Ht 6\' 4"  (1.93 m)   Wt 297 lb 6.4 oz (134.9 kg)   SpO2 96%   BMI 36.20 kg/m  , BMI Body mass index is 36.2 kg/m. GEN: Well nourished, well developed, in no acute distress. HEENT: normal. Neck: Supple, no JVD, carotid bruits, or masses. Cardiac: RRR, no murmurs, rubs, or gallops. No clubbing, cyanosis, edema.  Radials/DP/PT 2+ and equal bilaterally.  Respiratory:  Respirations regular and unlabored, clear to auscultation bilaterally. GI: Soft, nontender, nondistended, BS + x 4. MS: no deformity or atrophy. Skin: warm and dry, no rash. Neuro:  Strength and sensation are intact.  Somnolent in exam  room easily arousable.  Answers questions appropriately. Psych: Normal affect.  Accessory Clinical Findings    Recent Labs: 12/27/2022: ALT 21 07/13/2023: Hemoglobin 15.4; Platelets 213 08/04/2023: BUN 13; Creatinine, Ser 0.97; Potassium 4.5; Sodium 142   Recent Lipid Panel    Component Value Date/Time   CHOL 150 12/16/2022 1700   TRIG 62 12/16/2022 1700   HDL 55 12/16/2022 1700   CHOLHDL 2.7 12/16/2022 1700   LDLCALC 82 12/16/2022 1700         ECG personally reviewed by me today- none today. EKG Interpretation Date/Time:  Friday October 01 2023 15:30:47 EST Ventricular Rate:  82 PR Interval:    QRS Duration:  128 QT Interval:  392 QTC Calculation: 457 R Axis:   -20  Text Interpretation: Atrial fibrillation Left ventricular hypertrophy with QRS widening ( R in aVL , Cornell product ) Cannot rule out Septal infarct (cited on or before 09-Jun-2023) When compared with ECG of 13-Jul-2023 15:54, Questionable change in initial forces of Septal leads Confirmed by Edd Fabian [phone]) on 10/01/2023 3:31:36 PM    Echocardiogram 06/10/2022  IMPRESSIONS     1. Left ventricular ejection fraction, by estimation, is 40 to 45%. The  left ventricle has mildly decreased function. The left ventricle  demonstrates global hypokinesis. There is moderate left ventricular  hypertrophy. Left ventricular diastolic  parameters are indeterminate.   2. Right ventricular systolic function is normal. The right ventricular  size is normal. There is normal pulmonary artery systolic pressure. The  estimated right ventricular systolic pressure is 23.2 mmHg.   3. Left atrial size was moderately dilated.   4. The mitral valve is normal in structure. No evidence of mitral valve  regurgitation. No evidence of mitral stenosis.   5. The aortic valve is tricuspid. Aortic valve regurgitation is not  visualized. No aortic stenosis is present.   6. The inferior vena cava is normal in size with greater than 50%   respiratory variability, suggesting right atrial pressure of 3 mmHg.   FINDINGS   Left Ventricle: Left ventricular ejection fraction, by estimation, is 40  to 45%. The left ventricle has mildly decreased function. The left  ventricle demonstrates global hypokinesis. Definity contrast agent was  given IV to delineate the left ventricular   endocardial borders. The left ventricular internal cavity size was normal  in size. There is moderate left ventricular hypertrophy. Left ventricular  diastolic parameters are indeterminate.   Right Ventricle: The right ventricular size is normal. No increase in  right ventricular wall thickness. Right ventricular systolic function is  normal. There is normal pulmonary artery systolic pressure. The tricuspid  regurgitant velocity is 1.95 m/s, and   with an assumed right atrial pressure of 8 mmHg, the estimated right  ventricular systolic pressure is 23.2 mmHg.   Left Atrium: Left atrial size was moderately dilated.   Right Atrium: Right atrial size was normal in size.   Pericardium: There is no evidence of pericardial effusion.   Mitral Valve: The mitral valve is normal in structure. No evidence of  mitral valve regurgitation. No evidence of mitral valve stenosis.   Tricuspid Valve: The tricuspid valve is normal in structure. Tricuspid  valve regurgitation is trivial. No evidence of tricuspid stenosis.   Aortic Valve: The aortic valve is tricuspid. Aortic valve regurgitation is  not visualized. No aortic stenosis is present.   Pulmonic Valve: The pulmonic valve was normal in structure. Pulmonic valve  regurgitation is not visualized. No evidence of pulmonic stenosis.   Aorta: The aortic root is normal in size and structure.   Venous: The inferior vena cava is normal in size with greater than 50%  respiratory variability, suggesting right atrial pressure of 3 mmHg.   IAS/Shunts: No atrial level shunt detected by color flow Doppler.        Assessment & Plan   1.  Chronic combined systolic and diastolic CHF-  Weight today 297.4.  Saturating 96% on room air. Continue losartan to 25 mg daily, furosemide, metoprolol Heart healthy low-sodium diet Continue increase physical activity Daily weights-maintain weight log Lower extremity support stockings CBC, BMP, BNP  Paroxysmal atrial fibrillation-heart rate today 82 bpm.  Reports compliance with apixaban.  Denies bleeding issues.  Working with patient assistance.   Avoid triggers caffeine, chocolate, EtOH, dehydration etc. Continue apixaban, metoprolol Reorder social work  Daytime somnolence-order home sleep study STOP-BANG score 7.  Appears somewhat sleepy in office today.  Easily aroused and answers questions appropriately.  Pupils pinpoint and fixed.  Denies illicit drug use. Avoid supine sleeping Continue weight loss Home sleep study-reorder  CKD stage II-creatinine 0. 9 7 on 08/04/2023.   Maintain p.o. hydration  Social determinants of health-now working with social work.  Disposition: Follow-up with Dr. Lynnette Caffey or APP in 2-3 months.  Thomasene Ripple. Tyja Gortney NP-C     10/01/2023, 3:52 PM [facility] [address] Office [phone] Fax [phone]    I spent 14 minutes examining this patient, reviewing medications, and using patient centered shared decision making involving her cardiac care.   I spent greater than 20 minutes reviewing her past medical history,  medications, and prior cardiac tests.

## 2023-10-01 ENCOUNTER — Ambulatory Visit: Payer: Medicare Other | Attending: General Practice | Admitting: General Practice

## 2023-10-01 ENCOUNTER — Encounter: Payer: Self-pay | Admitting: General Practice

## 2023-10-01 VITALS — BP 134/72 | HR 82 | Ht 76.0 in | Wt 297.4 lb

## 2023-10-01 DIAGNOSIS — N182 Chronic kidney disease, stage 2 (mild): Secondary | ICD-10-CM | POA: Diagnosis not present

## 2023-10-01 DIAGNOSIS — I5042 Chronic combined systolic (congestive) and diastolic (congestive) heart failure: Secondary | ICD-10-CM

## 2023-10-01 DIAGNOSIS — R400 Somnolence: Secondary | ICD-10-CM | POA: Diagnosis not present

## 2023-10-01 DIAGNOSIS — I480 Paroxysmal atrial fibrillation: Secondary | ICD-10-CM | POA: Diagnosis not present

## 2023-10-01 MED ORDER — APIXABAN 5 MG PO TABS: 5.0000 mg | ORAL_TABLET | Freq: Two times a day (BID) | ORAL | Status: DC

## 2023-10-01 NOTE — Patient Instructions (Signed)
 Medication Instructions:  The current medical regimen is effective;  continue present plan and medications as directed. Please refer to the Current Medication list given to you today.  *If you need a refill on your cardiac medications before your next appointment, please call your pharmacy*  Lab Work: CBC BMET AND BNP TODAY If you have labs (blood work) drawn today and your tests are completely normal, you will receive your results only by:  MyChart Message (if you have MyChart) OR A paper copy in the mail If you have any lab test that is abnormal or we need to change your treatment, we will call you to review the results.  Testing/Procedures: SEE BELOW FOR SLEEP STUDY SOCIAL WORK CONSULT-SOMEONE WILL BE CALLING TO DISCUSS  Follow-Up: At [facility], you and your health needs are our priority.  As part of our continuing mission to provide you with exceptional heart care, we have created designated Provider Care Teams.  These Care Teams include your primary Cardiologist (physician) and Advanced Practice Providers (APPs -  Physician Assistants and Nurse Practitioners) who all work together to provide you with the care you need, when you need it.  Your next appointment:   2-3 month(s)  Provider:   Orbie Pyo, MD  or Edd Fabian, FNP        Other Instructions WatchPAT?  Is a FDA cleared portable home sleep study test that uses a watch and 3 points of contact to monitor 7 different channels, including your heart rate, oxygen saturations, body position, snoring, and chest motion.  The study is easy to use from the comfort of your own home and accurately detect sleep apnea.  Before bed, you attach the chest sensor, attached the sleep apnea bracelet to your nondominant hand, and attach the finger probe.  After the study, the raw data is downloaded from the watch and scored for apnea events.   For more information: [email or web address]  Patient Testing  Instructions:  Do not put battery into the device until bedtime when you are ready to begin the test. Please call the support number if you need assistance after following the instructions below: 24 hour support line- [phone] or ITAMAR support at [phone] (option 2)  Download the IntelWatchPAT One" app through the google play store or App Store  Be sure to turn on or enable access to bluetooth in settlings on your smartphone/ device  Make sure no other bluetooth devices are on and within the vicinity of your smartphone/ device and WatchPAT watch during testing.  Make sure to leave your smart phone/ device plugged in and charging all night.  When ready for bed:  Follow the instructions step by step in the WatchPAT One App to activate the testing device. For additional instructions, including video instruction, visit the WatchPAT One video on Youtube. You can search for WatchPat One within Youtube (video is 4 minutes and 18 seconds) or enter: https://youtube/watch?v=BCce_vbiwxE Please note: You will be prompted to enter a Pin to connect via bluetooth when starting the test. The PIN will be assigned to you when you receive the test.  The device is disposable, but it recommended that you retain the device until you receive a call letting you know the study has been received and the results have been interpreted.  We will let you know if the study did not transmit to [place] properly after the test is completed. You do not need to call us to confirm the receipt of the test.  Please  complete the test within 48 hours of receiving PIN.   Frequently Asked Questions:  What is Watch Dennie Bible one?  A single use fully disposable home sleep apnea testing device and will not need to be returned after completion.  What are the requirements to use WatchPAT one?  The be able to have a successful watchpat one sleep study, you should have your Watch pat one device, your smart phone, watch pat one app, your PIN  number and Internet access What type of phone do I need?  You should have a smart phone that uses Android 5.1 and above or any Iphone with IOS 10 and above How can I download the WatchPAT one app?  Based on your device type search for WatchPAT one app either in google play for android devices or APP store for Iphone's Where will I get my PIN for the study?  Your PIN will be provided by your physician's office. It is used for authentication and if you lose/forget your PIN, please reach out to your providers office.  I do not have Internet at home. Can I do WatchPAT one study?  WatchPAT One needs Internet connection throughout the night to be able to transmit the sleep data. You can use your home/local internet or your cellular's data package. However, it is always recommended to use home/local Internet. It is estimated that between 20MB-30MB will be used with each study.However, the application will be looking for space in the phone to start the study.  What happens if I lose internet or bluetooth connection?  During the internet disconnection, your phone will not be able to transmit the sleep data. All the data, will be stored in your phone. As soon as the internet connection is back on, the phone will being sending the sleep data. During the bluetooth disconnection, WatchPAT one will not be able to to send the sleep data to your phone. Data will be kept in the [facility] one until two devices have bluetooth connection back on. As soon as the connection is back on, WatchPAT one will send the sleep data to the phone.  How long do I need to wear the WatchPAT one?  After you start the study, you should wear the device at least 6 hours.  How far should I keep my phone from the device?  During the night, your phone should be within 15 feet.  What happens if I leave the room for restroom or other reasons?  Leaving the room for any reason will not cause any problem. As soon as your get back to the room,  both devices will reconnect and will continue to send the sleep data. Can I use my phone during the sleep study?  Yes, you can use your phone as usual during the study. But it is recommended to put your watchpat one on when you are ready to go to bed.  How will I get my study results?  A soon as you completed your study, your sleep data will be sent to the provider. They will then share the results with you when they are ready.        s

## 2023-10-04 ENCOUNTER — Other Ambulatory Visit (HOSPITAL_COMMUNITY): Payer: Self-pay

## 2023-10-04 ENCOUNTER — Telehealth (HOSPITAL_COMMUNITY): Payer: Self-pay | Admitting: Licensed Clinical Social Worker

## 2023-10-04 ENCOUNTER — Other Ambulatory Visit: Payer: Self-pay

## 2023-10-04 MED ORDER — METOPROLOL SUCCINATE ER 25 MG PO TB24
12.5000 mg | ORAL_TABLET | Freq: Every day | ORAL | 3 refills | Status: DC
  Filled 2023-10-04: qty 45, 90d supply, fill #0

## 2023-10-04 MED ORDER — APIXABAN 5 MG PO TABS
5.0000 mg | ORAL_TABLET | Freq: Two times a day (BID) | ORAL | 0 refills | Status: DC
  Filled 2023-10-04: qty 60, 30d supply, fill #0

## 2023-10-04 MED ORDER — LOSARTAN POTASSIUM 25 MG PO TABS
25.0000 mg | ORAL_TABLET | Freq: Every day | ORAL | 3 refills | Status: DC
  Filled 2023-10-04: qty 90, 90d supply, fill #0

## 2023-10-04 MED ORDER — EMPAGLIFLOZIN 10 MG PO TABS
10.0000 mg | ORAL_TABLET | Freq: Every day | ORAL | 3 refills | Status: DC
  Filled 2023-10-04: qty 90, 90d supply, fill #0

## 2023-10-04 MED ORDER — FUROSEMIDE 40 MG PO TABS
40.0000 mg | ORAL_TABLET | Freq: Two times a day (BID) | ORAL | 3 refills | Status: DC
  Filled 2023-10-04: qty 180, 90d supply, fill #0

## 2023-10-04 NOTE — Telephone Encounter (Signed)
 H&V Care Navigation CSW Progress Note  Clinical Social Worker received referral to assist pt with medication cost concerns for [place].  CSW called pt to discuss concerns.  Patient reports he has not asked his pharmacy about his medications for about 6 months and is only taking Eliquis and Metoprolol at this time.  Patient not very clear over the phone but cost of medications has been prohibiting factor in obtaining.    CSW messaged [facility] patient advocate who ran test claim and found Eliquis was $12.15 and so was [place].  This would still be cost prohibitive for patient has he only gets $850/month in retirement benefits- was still working up until Oct of 2024 but has been without work since.    CSW assisted pt in applying for LIS program as he should qualify given income and assets.  Also requesting that Medicaid workers call him regarding phone application for Medicaid- he reports he had it before but pharmacy advocate unable to find active coverage at this time.  Patient agreeable to having meds moved to [facility] so that he can have meds mailed and have AR account set up so he doesn't have to pay everything up front.  CSW assisting in this transfer- messaged triage staff to assist in sending medications to [facility].  Patient advocate put in preference for mail order and AR account.    SDOH Screenings   Tobacco Use: Medium Risk (10/01/2023)    Burna Sis, LCSW Clinical Social Worker [facility] Desk#: [phone] Cell#: [phone]

## 2023-10-04 NOTE — Telephone Encounter (Signed)
 Received message from [facility] requesting refill of call cardiology prescriptions  Refill sent for:   -Eliquis   -Jardiance   -Lasix   -Losartan   -Metoprolol  All refills sent to [facility]

## 2023-10-05 ENCOUNTER — Other Ambulatory Visit: Payer: Self-pay

## 2023-10-06 ENCOUNTER — Telehealth (HOSPITAL_COMMUNITY): Payer: Self-pay | Admitting: Licensed Clinical Social Worker

## 2023-10-06 ENCOUNTER — Other Ambulatory Visit (HOSPITAL_COMMUNITY): Payer: Self-pay

## 2023-10-06 NOTE — Telephone Encounter (Signed)
 H&V Care Navigation CSW Progress Note  Clinical Social Worker contacted patient by phone to f/u on my colleagues assistance. Note Jenna, LCSW, had also called this morning after I called pt x2 and left voicemail. No additional needs noted at this time but should pt return my call will clarify information given this morning again as needed.  Patient is participating in a Managed Medicaid Plan:  No, UHC Medicare, pending Medicaid.   SDOH Screenings   Tobacco Use: Medium Risk (10/01/2023)     Octavio Graves, MSW, LCSW Clinical Social Worker [facility] Heart/Vascular Care Navigation  [phone]- work cell phone (preferred) [phone]- desk phone

## 2023-10-06 NOTE — Telephone Encounter (Signed)
 H&V Care Navigation CSW Progress Note  Clinical Social Worker had patient advocate confirm medications have been shipped successfully to patient.  CSW called pt to inform.  CSW provided pt with phone number to [facility] and instructed to call them when he ran low on future medications to request a refill.   SDOH Screenings   Tobacco Use: Medium Risk (10/01/2023)   Burna Sis, LCSW Clinical Social Worker [facility] Desk#: [phone] Cell#: [phone]

## 2023-10-08 ENCOUNTER — Telehealth (HOSPITAL_COMMUNITY): Payer: Self-pay | Admitting: Licensed Clinical Social Worker

## 2023-10-08 NOTE — Telephone Encounter (Addendum)
 Entered in error

## 2023-10-18 ENCOUNTER — Telehealth: Payer: Self-pay | Admitting: Internal Medicine

## 2023-10-18 NOTE — Telephone Encounter (Signed)
 Pt calling back to state that his insurance need documentation stating that he does indeed have heart issues. Please advise

## 2023-10-18 NOTE — Telephone Encounter (Signed)
 Patient needs records sent to his insurance company with his doctors name and diagnoses.

## 2023-10-18 NOTE — Telephone Encounter (Signed)
 Pt requesting cb to discuss documentation for insurance co stating that he has afib?????

## 2023-10-18 NOTE — Telephone Encounter (Signed)
 Patient reports that [facility] needs to know the name of his primary cardiologist. Advised that he sees Dr. Lynnette Caffey. He is not sure exactly what information that they need. He will find out and let us know if there is anything that we need to do on our end.

## 2023-10-19 ENCOUNTER — Telehealth: Payer: Self-pay

## 2023-10-19 DIAGNOSIS — H40053 Ocular hypertension, bilateral: Secondary | ICD-10-CM | POA: Diagnosis not present

## 2023-10-19 DIAGNOSIS — H4720 Unspecified optic atrophy: Secondary | ICD-10-CM | POA: Diagnosis not present

## 2023-10-19 NOTE — Telephone Encounter (Addendum)
**Note De-Identified Anvita Hirata Obfuscation** Ordering provider: Edd Fabian, NP Associated diagnoses: Somnolence-R40.0 and CHF- I50.42  WatchPAT PA obtained on 10/19/2023 by Jalena Vanderlinden, Lorelle Formosa, LPN. Authorization: Per the [facility] Provider Portal: [id number] Description Sleep study, unattended, simultaneous recording; heart rate, oxygen saturation, respiratory analysis (eg, by airflow or peripheral arterial tone), and sleep time Inquiry summary Notification/Prior Authorization not required for this service.  I have left a WatchPAT One-HST Device in the front office for the pt to pick up. Serial #: [id number]  Patient notified of PIN (1234) on 10/19/2023 Chrystle Murillo Notification Method: phone. The pt was not given a WatchPAT One-HST device yet. He states that he will come by the [facility] office today to pick one up.  Phone note routed to covering staff for follow-up.

## 2023-10-19 NOTE — Telephone Encounter (Signed)
 Spoke to patient to confirm what info was needed,he was unsure, Called to [facility] spoke with Hunt who advised they had all the info need, no additional info is needed. 3/25/25fbg

## 2023-11-24 ENCOUNTER — Encounter (HOSPITAL_COMMUNITY): Payer: Self-pay | Admitting: Emergency Medicine

## 2023-11-24 ENCOUNTER — Emergency Department (HOSPITAL_COMMUNITY)
Admission: EM | Admit: 2023-11-24 | Discharge: 2023-11-24 | Disposition: A | Discharge status: Home / Self Care | Attending: Emergency Medicine | Admitting: Emergency Medicine

## 2023-11-24 ENCOUNTER — Other Ambulatory Visit: Payer: Self-pay

## 2023-11-24 ENCOUNTER — Emergency Department (HOSPITAL_COMMUNITY)

## 2023-11-24 DIAGNOSIS — Z7901 Long term (current) use of anticoagulants: Secondary | ICD-10-CM | POA: Diagnosis not present

## 2023-11-24 DIAGNOSIS — Z859 Personal history of malignant neoplasm, unspecified: Secondary | ICD-10-CM | POA: Insufficient documentation

## 2023-11-24 DIAGNOSIS — Z79899 Other long term (current) drug therapy: Secondary | ICD-10-CM | POA: Diagnosis not present

## 2023-11-24 DIAGNOSIS — Z743 Need for continuous supervision: Secondary | ICD-10-CM | POA: Diagnosis not present

## 2023-11-24 DIAGNOSIS — R0602 Shortness of breath: Secondary | ICD-10-CM | POA: Insufficient documentation

## 2023-11-24 DIAGNOSIS — R0789 Other chest pain: Secondary | ICD-10-CM | POA: Insufficient documentation

## 2023-11-24 DIAGNOSIS — I499 Cardiac arrhythmia, unspecified: Secondary | ICD-10-CM | POA: Diagnosis not present

## 2023-11-24 DIAGNOSIS — I10 Essential (primary) hypertension: Secondary | ICD-10-CM | POA: Diagnosis not present

## 2023-11-24 DIAGNOSIS — R079 Chest pain, unspecified: Secondary | ICD-10-CM | POA: Diagnosis not present

## 2023-11-24 LAB — COMPREHENSIVE METABOLIC PANEL WITH GFR
ALT: 22 U/L (ref 0–44)
AST: 25 U/L (ref 15–41)
Albumin: 3.6 g/dL (ref 3.5–5.0)
Alkaline Phosphatase: 76 U/L (ref 38–126)
Anion gap: 13 (ref 5–15)
BUN: 16 mg/dL (ref 8–23)
CO2: 21 mmol/L — ABNORMAL LOW (ref 22–32)
Calcium: 9.7 mg/dL (ref 8.9–10.3)
Chloride: 104 mmol/L (ref 98–111)
Creatinine, Ser: 0.99 mg/dL (ref 0.61–1.24)
GFR, Estimated: >60 mL/min (ref >60)
Glucose, Bld: 88 mg/dL (ref 70–99)
Potassium: 3.9 mmol/L (ref 3.5–5.1)
Sodium: 138 mmol/L (ref 135–145)
Total Bilirubin: 0.7 mg/dL (ref 0.0–1.2)
Total Protein: 7.6 g/dL (ref 6.5–8.1)

## 2023-11-24 LAB — CBC
HCT: 45.8 % (ref 39.0–52.0)
Hemoglobin: 15.2 g/dL (ref 13.0–17.0)
MCH: 27.9 pg (ref 26.0–34.0)
MCHC: 33.2 g/dL (ref 30.0–36.0)
MCV: 84.2 fL (ref 80.0–100.0)
Platelets: 202 10*3/uL (ref 150–400)
RBC: 5.44 MIL/uL (ref 4.22–5.81)
RDW: 15.9 % — ABNORMAL HIGH (ref 11.5–15.5)
WBC: 5.6 10*3/uL (ref 4.0–10.5)
nRBC: 0 % (ref 0.0–0.2)

## 2023-11-24 LAB — BRAIN NATRIURETIC PEPTIDE: B Natriuretic Peptide: 74.6 pg/mL (ref 0.0–100.0)

## 2023-11-24 LAB — TROPONIN I (HIGH SENSITIVITY): Troponin I (High Sensitivity): 14 ng/L (ref <18)

## 2023-11-24 LAB — RESP PANEL BY RT-PCR (RSV, FLU A&B, COVID)  RVPGX2
Influenza A by PCR: NEGATIVE
Influenza B by PCR: NEGATIVE
Resp Syncytial Virus by PCR: NEGATIVE
SARS Coronavirus 2 by RT PCR: NEGATIVE

## 2023-11-24 NOTE — ED Triage Notes (Signed)
 Pt BIB by EMS for [facility] since this morning. Endorses symptoms worsens with exertion. Afib on EKG, unsure of prior hx. Denies any current CP.

## 2023-11-24 NOTE — Discharge Instructions (Addendum)
 It was a pleasure caring for you today.  Your workup today was reassuring. As discussed, please follow-up with your cardiology appointment next week.  Seek emergency care if experiencing any new or worsening symptoms.

## 2023-11-24 NOTE — ED Provider Notes (Signed)
 [place] EMERGENCY DEPARTMENT AT [facility] Provider Note   CSN: [id number] Arrival date & time: 11/24/23  1812     History  Chief Complaint  Patient presents with   Shortness of Breath    Riley Jefferson is a 63 y.o. male with PMHx afib on Eliquis , HTN who presents to ED concerned for intermittent DOE for weeks. Patient then relating today's episode to the hot weather today. Patient also endorsing vague weakness yesterday which caused him to fall. Patient also endorses intermittent episodes of chest pain with the last episode this morning. Patient stating that the episodes of chest pain are very short lived. Patient also endorsing mild coughing this past week. Patient stating that these symptoms have been happening for a while so he figured that he would come in to get checked out today. Patient then immediately starts asking for food.  Patient endorsing follow up cardiology appointment next week.  Denies fever, nausea, vomiting, diarrhea, dysuria, hematuria, hematochezia. Denies recent surgery/immobilization, hx DVT/PE, hemoptysis, hx cancer in the past 6 months, calf swelling/tenderness. Denies Tob use. Denies weight gain/LE swelling.       Shortness of Breath      Home Medications Prior to Admission medications   Medication Sig Start Date End Date Taking? Authorizing Provider  acetaminophen  (TYLENOL ) 325 MG tablet Take 2 tablets (650 mg total) by mouth every 6 (six) hours as needed for mild pain (or Fever >/= 101). 06/10/22   Aura Leeds Latif, DO  apixaban  (ELIQUIS ) 5 MG TABS tablet Take 1 tablet (5 mg total) by mouth 2 (two) times daily. 10/04/23   Thukkani, Arun K, MD  empagliflozin  (JARDIANCE ) 10 MG TABS tablet Take 1 tablet (10 mg total) by mouth daily. 10/04/23   Thukkani, Arun K, MD  furosemide  (LASIX ) 40 MG tablet Take 1 tablet (40 mg total) by mouth 2 (two) times daily. 10/04/23   Thukkani, Arun K, MD  latanoprost  (XALATAN ) 0.005 % ophthalmic solution  Place 1 drop into both eyes at bedtime. 12/19/22   [provider]  losartan  (COZAAR ) 25 MG tablet Take 1 tablet (25 mg total) by mouth daily. 10/04/23   Thukkani, Arun K, MD  metoprolol  succinate (TOPROL -XL) 25 MG 24 hr tablet Take 0.5 tablets (12.5 mg total) by mouth daily. 10/04/23   Thukkani, Arun K, MD  oxyCODONE  (ROXICODONE ) 5 MG immediate release tablet Take 1 tablet (5 mg total) by mouth every 4 (four) hours as needed for severe pain. 04/07/23   Ninetta Basket, MD      Allergies    Patient has no known allergies.    Review of Systems   Review of Systems  Respiratory:  Positive for shortness of breath.     Physical Exam Updated Vital Signs BP (!) 161/65   Pulse 69   Temp 98 F (36.7 C) (Oral)   Resp 16   SpO2 100%  Physical Exam Vitals and nursing note reviewed.  Constitutional:      General: He is not in acute distress.    Appearance: He is not ill-appearing or toxic-appearing.  HENT:     Head: Normocephalic and atraumatic.     Mouth/Throat:     Mouth: Mucous membranes are moist.     Pharynx: No posterior oropharyngeal erythema.  Eyes:     General: No scleral icterus.       Right eye: No discharge.        Left eye: No discharge.     Conjunctiva/sclera: Conjunctivae normal.  Cardiovascular:  Rate and Rhythm: Normal rate and regular rhythm.     Pulses: Normal pulses.     Heart sounds: No murmur heard. Pulmonary:     Effort: Pulmonary effort is normal. No respiratory distress.     Breath sounds: Normal breath sounds. No wheezing, rhonchi or rales.  Chest:     Chest wall: No tenderness.  Abdominal:     General: Bowel sounds are normal.     Palpations: Abdomen is soft. There is no mass.     Tenderness: There is no abdominal tenderness.  Musculoskeletal:     Right lower leg: No edema.     Left lower leg: No edema.  Skin:    General: Skin is warm and dry.     Findings: No rash.  Neurological:     General: No focal deficit present.     Mental  Status: He is alert and oriented to person, place, and time. Mental status is at baseline.     Comments: Patient ambulatory in ED and yelling at staff to get him food.  Psychiatric:        Mood and Affect: Mood normal.     ED Results / Procedures / Treatments   Labs (all labs ordered are listed, but only abnormal results are displayed) Labs Reviewed  COMPREHENSIVE METABOLIC PANEL WITH GFR - Abnormal; Notable for the following components:      Result Value   CO2 21 (*)    All other components within normal limits  CBC - Abnormal; Notable for the following components:   RDW 15.9 (*)    All other components within normal limits  RESP PANEL BY RT-PCR (RSV, FLU A&B, COVID)  RVPGX2  BRAIN NATRIURETIC PEPTIDE  TROPONIN I (HIGH SENSITIVITY)    EKG EKG Interpretation Date/Time:  Wednesday November 24 2023 20:29:13 EDT Ventricular Rate:  54 PR Interval:    QRS Duration:  124 QT Interval:  440 QTC Calculation: 417 R Axis:   -7  Text Interpretation: Atrial fibrillation Left ventricular hypertrophy Anterior Q waves, possibly due to LVH Nonspecific T abnormalities, inferior leads Confirmed by Hershel Los [phone]) on 11/24/2023 9:58:22 PM  Radiology DG Chest Port 1 View Result Date: 11/24/2023 CLINICAL DATA:  Shortness of breath since this morning EXAM: PORTABLE CHEST 1 VIEW COMPARISON:  07/13/2023 FINDINGS: The heart size and mediastinal contours are within normal limits. Both lungs are clear. The visualized skeletal structures are unremarkable. IMPRESSION: No active disease. Electronically Signed   By: Rozell Cornet M.D.   On: 11/24/2023 19:35    Procedures Procedures    Medications Ordered in ED Medications - No data to display  ED Course/ Medical Decision Making/ A&P                                 Medical Decision Making Amount and/or Complexity of Data Reviewed Labs: ordered. Radiology: ordered.   This patient presents to the ED for concern of shortness of breath, this  involves an extensive number of treatment options, and is a complaint that carries with it a high risk of complications and morbidity.  The differential diagnosis includes Anxiety, Anaphylaxis/Angioedema, Aspirated FB, Arrhythmia, CHF, Asthma, COPD, PNA, COVID/Flu/RSV, STEMI, Tamponade, TPNX, Sepsis   Co morbidities that complicate the patient evaluation  Afib, HTN   Additional history obtained:  05/2022 ECHO: 40-45% EF   Problem List / ED Course / Critical interventions / Medication management  Patient presents to ED  concern for multiple chronic and vague symptoms.  Endorses intermittent and short-lived episodes of chest pain and DOE.  Last episode of chest pain was >7 hours ago.  Also endorses recent mild cough.  Physical exam reassuring.  Patient afebrile with stable vitals.Patient ambulated with O2 96-98% on RA I Ordered, and personally interpreted labs.  CMP reassuring.  Respiratory panel negative.  BNP within normal limits.  CBC without leukocytosis or anemia.  Troponin within normal limits. The patient was maintained on a cardiac monitor.  I personally viewed and interpreted the cardiac monitored which showed an underlying rhythm of: afib I ordered imaging studies including chest xray to assess for process contributing to patient's symptoms. I independently visualized and interpreted imaging which showed no acute cardiopulmonary disease. I agree with the radiologist interpretation Shared all results with patient.  Answered all questions.  Stressed the patient the importance of following up with his cardiology appointment next week verbalized understanding of plan. Staffed with Dr. Nolia Baumgartner I have reviewed the patients home medicines and have made adjustments as needed The patient has been appropriately medically screened and/or stabilized in the ED. I have low suspicion for any other emergent medical condition which would require further screening, evaluation or treatment in the ED or  require inpatient management. At time of discharge the patient is hemodynamically stable and in no acute distress. I have discussed work-up results and diagnosis with patient and answered all questions. Patient is agreeable with discharge plan. We discussed strict return precautions for returning to the emergency department and they verbalized understanding.    DDx: These are considered less likely due to history of present illness and physical exam findings -Arrhythmia/STEMI: EKG reassuring -Asthma/COPD/PNA/COVID/Flu/RSV: Lungs clear to auscultation bilaterally and O2 sat 100% on RA -Tamponade/CHF: no physical exam findings  Social Determinants of Health:  none          Final Clinical Impression(s) / ED Diagnoses Final diagnoses:  Atypical chest pain  SOB (shortness of breath)    Rx / DC Orders ED Discharge Orders     None          [place], [place] 11/24/23 2240    Hershel Los, MD 11/24/23 [phone]

## 2023-11-24 NOTE — ED Notes (Signed)
 Phlebotomy attempted to collect second troponin. Patient stated "y'all wont get no blood until I get more food"

## 2023-11-29 NOTE — Progress Notes (Unsigned)
 Cardiology Clinic Note   Patient Name: Riley Jefferson Date of Encounter: 12/01/2023  Primary Care Provider:  System, Provider Not In Primary Cardiologist:  Arun K Thukkani, MD  Patient Profile    Jourdain Vielman 63 year old male presents the clinic today for follow-up evaluation of his chronic combined systolic and diastolic CHF.  Past Medical History    Past Medical History:  Diagnosis Date   Atrial fibrillation (HCC)    Hypertension    History reviewed. No pertinent surgical history.  Allergies  No Known Allergies  History of Present Illness    Frak Paolini is a PMH of combined systolic and diastolic CHF, paroxysmal atrial fibrillation, CKD stage II, elevated BMI, and snoring.  Echocardiogram 2020) at [place]  showed an EF of 40-45% echocardiogram 2023 showed an EF of 40-45%, cardiac PET CT 2022 in [facility] [place]  was negative.  He was admitted with CHF exacerbation in November.  His troponins and BNP were normal.  He was started on diuresis and discharge.  He presented for follow-up in the outpatient clinic in November 2023.  He was noted to have dyspnea.  He was not able to start Farxiga or Entresto .  Social work consult was placed.  He followed up 12/23 and was noted to be disoriented.  He reported breathing difficulty.  A plan was made to start Jardiance  and furosemide .  Follow-up lab work was also planned.  He reported snoring and endorsed daytime somnolence.  Sleep study was also planned.  He was seen in follow-up by Dr. Lorie Rook on 06/09/2023.  He had been off of his Jardiance  and Entresto .  He reported that he never presented to perform sleep study.  He had been seen in the emergency department in June 2024 with nosebleed.  This was a result of his high blood pressure and being on apixaban .  ENT was consulted and managed.  He was discharged with plan to restart his apixaban .  He did not restart his apixaban .  He reported having trouble getting his medications  including his Jardiance  and Entresto .  He was only taking furosemide  and metoprolol .  He did note some shortness of breath.  This was intermittent.  His weight was stable.  He denied orthopnea.  Patient assistance referral was made.  He was restarted on losartan  25 mg daily.  His furosemide  was increased to 40 mg twice daily.  BMP in 1 week was ordered.  His Eliquis  was also restarted.  Follow-up was planned for 1-2 weeks.  He presented to the clinic 08/04/23 for follow-up evaluation and stated he had not been working since October last year.  He reported that he had not been taking his apixaban  or his Jardiance .  His weight was improved at 283 pounds.  This was down from 285 pounds.  His blood pressure initially was 156/98 and on recheck it was 112/56.  He was very sedentary.  We reviewed the importance of medication compliance.  I  put in a social work consult.  I was unable to uptitrate GDMT due to blood pressure.  I planned repeat  BMP  and planned  follow-up in 2 to 3 months.  I will also order home sleep study/WatchPAT.  Sleep study was not completed.  BMP showed stable renal function and electrolytes.  He presented to the clinic 10/01/23 for follow-up evaluation and stated he was worried about his atrial fibrillation.  He was somnolent in exam room today.  He was easily aroused and answered questions appropriately.  We reviewed his atrial  fibrillation.  He was rate controlled today in the 80s.  His blood pressure initially was 145/90 and on recheck was 134/72.  He reported intermittent compliance with his medication.  He noted that he took his metoprolol  and losartan .  He reported that he had run out of Eliquis .  We reviewed the social work consult that was previously put in.  He denied contact with social work.  I gave him samples of Eliquis , reordered social work consult, ordered CBC, BMP, BNP, and encouraged medication compliance.  He expressed understanding.  I  planned follow-up in 2 to 3 months.  He  was seen in the emergency department on 11/24/2023.  He reported intermittent DOE for weeks.  He attributed his symptoms to the hot weather.  He noted vague weakness the prior day.  This is caused him to fall.  He noted intermittent episodes of chest discomfort that were short-lived.  He also noted mild cough.  He reported that the symptoms have been happening for quite some time so he felt he should present to the emergency department to get evaluated.  At the time of evaluation he immediately requested food and proceeded to yell at staff to bring him food.  His blood pressure was noted to be elevated at 161/65.  His respiratory virus panel was negative.  CMP was reassuring.  BNP was within normal limits.  CBC reassuring.  Troponins were normal.  He was instructed to follow-up with cardiology.  He presents to the clinic today for follow-up evaluation and states he is feeling much better.  He reports that he has been compliant with his medications.  He has occasional episodes of chest discomfort that sound precordial in nature.  They last for seconds and dissipate without intervention.  We reviewed his medication list.  He does note that he needs a refill of his Jardiance .  He is having some trouble with work right now.  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  He does stay somewhat physically active helping his sister with yard work.  We reviewed the importance of heart healthy low-sodium diet.  I will give him the salty 6 diet sheet, refill his Jardiance  and plan follow-up in 4 months.  Today he denies chest pain, increased shortness of breath, lower extremity edema, fatigue, palpitations, melena, hematuria, hemoptysis, diaphoresis, weakness, presyncope, syncope, orthopnea, and PND.     Home Medications    Prior to Admission medications   Medication Sig Start Date End Date Taking? Authorizing Provider  acetaminophen   (TYLENOL ) 325 MG tablet Take 2 tablets (650 mg total) by mouth every 6 (six) hours as needed for mild pain (or Fever >/= 101). 06/10/22   Sheikh, Jonel Nephew Latif, DO  apixaban  (ELIQUIS ) 5 MG TABS tablet Take 1 tablet (5 mg total) by mouth 2 (two) times daily. 06/09/23   Thukkani, Arun K, MD  empagliflozin  (JARDIANCE ) 10 MG TABS tablet Take 1 tablet (10 mg total) by mouth daily. Patient not taking: Reported on 06/09/2023 12/29/22   Elgergawy, Ardia Kraft, MD  furosemide  (LASIX ) 40 MG tablet Take 1 tablet (40 mg total) by mouth 2 (two) times daily. 06/09/23   Thukkani, Arun K, MD  latanoprost  (XALATAN ) 0.005 % ophthalmic solution Place 1 drop into both eyes at bedtime. 12/19/22   [provider]  losartan  (COZAAR ) 25 MG tablet Take 1 tablet (25 mg total) by mouth daily. 06/09/23   Thukkani, Arun K,  MD  metoprolol  succinate (TOPROL -XL) 25 MG 24 hr tablet Take 0.5 tablets (12.5 mg total) by mouth daily. 01/01/23   Elgergawy, Ardia Kraft, MD  oxyCODONE  (ROXICODONE ) 5 MG immediate release tablet Take 1 tablet (5 mg total) by mouth every 4 (four) hours as needed for severe pain. 04/07/23   Ninetta Basket, MD    Family History    Family History  Problem Relation Age of Onset   Stroke Father    He indicated that his father is deceased.  Social History    Social History   Socioeconomic History   Marital status: Single    Spouse name: Not on file   Number of children: Not on file   Years of education: Not on file   Highest education level: Not on file  Occupational History   Not on file  Tobacco Use   Smoking status: Former    Current packs/day: 0.00    Average packs/day: 1 pack/day for 20.0 years (20.0 ttl pk-yrs)    Types: Cigarettes    Start date: 09/21/1981    Quit date: 09/21/2001    Years since quitting: 22.2   Smokeless tobacco: Never  Vaping Use   Vaping status: Never Used  Substance and Sexual Activity   Alcohol use: Never   Drug use: Never   Sexual activity: Not Currently     Partners: Female    Comment: Single  Other Topics Concern   Not on file  Social History Narrative   Not on file   Social Drivers of Health   Financial Resource Strain: Not on file  Food Insecurity: Not on file  Transportation Needs: Not on file  Physical Activity: Not on file  Stress: Not on file  Social Connections: Not on file  Intimate Partner Violence: Not on file     Review of Systems    General:  No chills, fever, night sweats or weight changes.  Cardiovascular:  No chest pain, dyspnea on exertion, edema, orthopnea, palpitations, paroxysmal nocturnal dyspnea. Dermatological: No rash, lesions/masses Respiratory: No cough, dyspnea Urologic: No hematuria, dysuria Abdominal:   No nausea, vomiting, diarrhea, bright red blood per rectum, melena, or hematemesis Neurologic:  No visual changes, wkns, changes in mental status. All other systems reviewed and are otherwise negative except as noted above.  Physical Exam    VS:  BP 122/74 (BP Location: Left Arm, Patient Position: Sitting, Cuff Size: Large)   Pulse (!) 56   Ht 6\' 4"  (1.93 m)   Wt 296 lb (134.3 kg)   SpO2 93%   BMI 36.03 kg/m  , BMI Body mass index is 36.03 kg/m. GEN: Well nourished, well developed, in no acute distress. HEENT: normal. Neck: Supple, no JVD, carotid bruits, or masses. Cardiac: RRR, no murmurs, rubs, or gallops. No clubbing, cyanosis, edema.  Radials/DP/PT 2+ and equal bilaterally.  Respiratory:  Respirations regular and unlabored, clear to auscultation bilaterally. GI: Soft, nontender, nondistended, BS + x 4. MS: no deformity or atrophy. Skin: warm and dry, no rash. Neuro:  Strength and sensation are intact.   Psych: Normal affect.  Accessory Clinical Findings    Recent Labs: 11/24/2023: ALT 22; B Natriuretic Peptide 74.6; BUN 16; Creatinine, Ser 0.99; Hemoglobin 15.2; Platelets 202; Potassium 3.9; Sodium 138   Recent Lipid Panel    Component Value Date/Time   CHOL 150 12/16/2022 1700    TRIG 62 12/16/2022 1700   HDL 55 12/16/2022 1700   CHOLHDL 2.7 12/16/2022 1700   LDLCALC 82 12/16/2022 1700  ECG personally reviewed by me today- none today.      Echocardiogram 06/10/2022  IMPRESSIONS     1. Left ventricular ejection fraction, by estimation, is 40 to 45%. The  left ventricle has mildly decreased function. The left ventricle  demonstrates global hypokinesis. There is moderate left ventricular  hypertrophy. Left ventricular diastolic  parameters are indeterminate.   2. Right ventricular systolic function is normal. The right ventricular  size is normal. There is normal pulmonary artery systolic pressure. The  estimated right ventricular systolic pressure is 23.2 mmHg.   3. Left atrial size was moderately dilated.   4. The mitral valve is normal in structure. No evidence of mitral valve  regurgitation. No evidence of mitral stenosis.   5. The aortic valve is tricuspid. Aortic valve regurgitation is not  visualized. No aortic stenosis is present.   6. The inferior vena cava is normal in size with greater than 50%  respiratory variability, suggesting right atrial pressure of 3 mmHg.   FINDINGS   Left Ventricle: Left ventricular ejection fraction, by estimation, is 40  to 45%. The left ventricle has mildly decreased function. The left  ventricle demonstrates global hypokinesis. Definity  contrast agent was  given IV to delineate the left ventricular   endocardial borders. The left ventricular internal cavity size was normal  in size. There is moderate left ventricular hypertrophy. Left ventricular  diastolic parameters are indeterminate.   Right Ventricle: The right ventricular size is normal. No increase in  right ventricular wall thickness. Right ventricular systolic function is  normal. There is normal pulmonary artery systolic pressure. The tricuspid  regurgitant velocity is 1.95 m/s, and   with an assumed right atrial pressure of 8 mmHg, the  estimated right  ventricular systolic pressure is 23.2 mmHg.   Left Atrium: Left atrial size was moderately dilated.   Right Atrium: Right atrial size was normal in size.   Pericardium: There is no evidence of pericardial effusion.   Mitral Valve: The mitral valve is normal in structure. No evidence of  mitral valve regurgitation. No evidence of mitral valve stenosis.   Tricuspid Valve: The tricuspid valve is normal in structure. Tricuspid  valve regurgitation is trivial. No evidence of tricuspid stenosis.   Aortic Valve: The aortic valve is tricuspid. Aortic valve regurgitation is  not visualized. No aortic stenosis is present.   Pulmonic Valve: The pulmonic valve was normal in structure. Pulmonic valve  regurgitation is not visualized. No evidence of pulmonic stenosis.   Aorta: The aortic root is normal in size and structure.   Venous: The inferior vena cava is normal in size with greater than 50%  respiratory variability, suggesting right atrial pressure of 3 mmHg.   IAS/Shunts: No atrial level shunt detected by color flow Doppler.       Assessment & Plan   1.  Chronic combined systolic and diastolic CHF-  Weight today 296.  Saturating 98% on room air.  No increased DOE or activity intolerance.  Echocardiogram 06/10/2022 showed an LVEF of 40-45%, intermediate diastolic parameters, left atria moderately dilated, and no significant valvular abnormalities.  Recent lab work unremarkable 11/24/2023. Continue losartan  to 25 mg daily, furosemide , metoprolol ,  Refill Jardiance  Heart healthy low-sodium diet-salty 6 diet sheet Continue increase physical activity Daily weights-maintain weight log  Paroxysmal atrial fibrillation-heart rate today 56 bpm.  Reports compliance with apixaban .  Denies bleeding issues.  Working with patient assistance.   Avoid triggers caffeine, chocolate, EtOH, dehydration etc. Continue apixaban , metoprolol   CKD stage  II-creatinine 0.  9 9 on  11/24/2023 Maintain p.o. hydration  Social determinants of health-now working with social work.  Reports medication compliance and is receiving medications in the mail.  Disposition: Follow-up with Dr. Lorie Rook in 4 months.  Chet Cota. Keiandra Sullenger NP-C     12/01/2023, 4:24 PM [facility] [address] Office [phone] Fax [phone]    I spent 14 minutes examining this patient, reviewing medications, and using patient centered shared decision making involving her cardiac care.   I spent greater than 20 minutes reviewing her past medical history,  medications, and prior cardiac tests.

## 2023-12-01 ENCOUNTER — Encounter: Payer: Self-pay | Admitting: General Practice

## 2023-12-01 ENCOUNTER — Ambulatory Visit: Attending: General Practice | Admitting: General Practice

## 2023-12-01 VITALS — BP 122/74 | HR 56 | Ht 76.0 in | Wt 296.0 lb

## 2023-12-01 DIAGNOSIS — Z139 Encounter for screening, unspecified: Secondary | ICD-10-CM | POA: Diagnosis not present

## 2023-12-01 DIAGNOSIS — I480 Paroxysmal atrial fibrillation: Secondary | ICD-10-CM

## 2023-12-01 DIAGNOSIS — N182 Chronic kidney disease, stage 2 (mild): Secondary | ICD-10-CM

## 2023-12-01 DIAGNOSIS — I5042 Chronic combined systolic (congestive) and diastolic (congestive) heart failure: Secondary | ICD-10-CM | POA: Diagnosis not present

## 2023-12-01 MED ORDER — EMPAGLIFLOZIN 10 MG PO TABS: 10.0000 mg | ORAL_TABLET | Freq: Every day | ORAL | 3 refills | Status: DC

## 2023-12-01 NOTE — Patient Instructions (Addendum)
 Medication Instructions:  Your physician recommends that you continue on your current medications as directed. Please refer to the Current Medication list given to you today.  Refill for Jardiance  has been sent to your pharmacy.  *If you need a refill on your cardiac medications before your next appointment, please call your pharmacy*  Lab Work: None ordered today. If you have labs (blood work) drawn today and your tests are completely normal, you will receive your results only by: MyChart Message (if you have MyChart) OR A paper copy in the mail If you have any lab test that is abnormal or we need to change your treatment, we will call you to review the results.  Testing/Procedures: None ordered today.  Follow-Up: At [facility], you and your health needs are our priority.  As part of our continuing mission to provide you with exceptional heart care, our providers are all part of one team.  This team includes your primary Cardiologist (physician) and Advanced Practice Providers or APPs (Physician Assistants and Nurse Practitioners) who all work together to provide you with the care you need, when you need it.  Your next appointment:   4 month(s)  The format for your next appointment:   In Person  Provider:   Arun K Thukkani, MD{  We recommend signing up for the patient portal called "MyChart".  Sign up information is provided on this After Visit Summary.  MyChart is used to connect with patients for Virtual Visits (Telemedicine).  Patients are able to view lab/test results, encounter notes, upcoming appointments, etc.  Non-urgent messages can be sent to your provider as well.   To learn more about what you can do with MyChart, go to [email or web address].   Other Instructions Continue physical activity.

## 2023-12-07 ENCOUNTER — Emergency Department (HOSPITAL_COMMUNITY)

## 2023-12-07 ENCOUNTER — Encounter (HOSPITAL_COMMUNITY): Payer: Self-pay

## 2023-12-07 ENCOUNTER — Other Ambulatory Visit: Payer: Self-pay

## 2023-12-07 ENCOUNTER — Emergency Department (HOSPITAL_COMMUNITY)
Admission: EM | Admit: 2023-12-07 | Discharge: 2023-12-07 | Disposition: A | Discharge status: Home / Self Care | Attending: Emergency Medicine | Admitting: Emergency Medicine

## 2023-12-07 DIAGNOSIS — Z79899 Other long term (current) drug therapy: Secondary | ICD-10-CM | POA: Diagnosis not present

## 2023-12-07 DIAGNOSIS — M1712 Unilateral primary osteoarthritis, left knee: Secondary | ICD-10-CM | POA: Diagnosis not present

## 2023-12-07 DIAGNOSIS — W19XXXA Unspecified fall, initial encounter: Secondary | ICD-10-CM | POA: Insufficient documentation

## 2023-12-07 DIAGNOSIS — S8012XA Contusion of left lower leg, initial encounter: Secondary | ICD-10-CM | POA: Insufficient documentation

## 2023-12-07 DIAGNOSIS — M2580 Other specified joint disorders, unspecified joint: Secondary | ICD-10-CM | POA: Diagnosis not present

## 2023-12-07 DIAGNOSIS — M25562 Pain in left knee: Secondary | ICD-10-CM | POA: Diagnosis not present

## 2023-12-07 DIAGNOSIS — S80912A Unspecified superficial injury of left knee, initial encounter: Secondary | ICD-10-CM | POA: Diagnosis present

## 2023-12-07 DIAGNOSIS — S80919A Unspecified superficial injury of unspecified knee, initial encounter: Secondary | ICD-10-CM | POA: Diagnosis not present

## 2023-12-07 DIAGNOSIS — S79922A Unspecified injury of left thigh, initial encounter: Secondary | ICD-10-CM | POA: Diagnosis not present

## 2023-12-07 DIAGNOSIS — M25862 Other specified joint disorders, left knee: Secondary | ICD-10-CM | POA: Diagnosis not present

## 2023-12-07 MED ORDER — ACETAMINOPHEN 325 MG PO TABS
650.0000 mg | ORAL_TABLET | Freq: Once | ORAL | Status: AC
  Administered 2023-12-07: 650 mg via ORAL
  Filled 2023-12-07: qty 2

## 2023-12-07 NOTE — ED Notes (Signed)
 Ortho paged.

## 2023-12-07 NOTE — ED Notes (Signed)
 PT given a snack bag

## 2023-12-07 NOTE — ED Notes (Signed)
Ortho tech called for knee immobilizer. °
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
## 2023-12-07 NOTE — Discharge Instructions (Addendum)
 Please follow-up closely with orthopedics on an outpatient basis.  Please continue to wear the knee immobilizer when walking until evaluated by orthopedics.  Return to emergency department immediately for any new or worsening symptoms.

## 2023-12-08 ENCOUNTER — Encounter (HOSPITAL_COMMUNITY): Payer: Self-pay

## 2023-12-08 ENCOUNTER — Ambulatory Visit (HOSPITAL_COMMUNITY)
Admission: EM | Admit: 2023-12-08 | Discharge: 2023-12-08 | Disposition: A | Discharge status: Home / Self Care | Attending: Emergency Medicine | Admitting: Emergency Medicine

## 2023-12-08 DIAGNOSIS — H66001 Acute suppurative otitis media without spontaneous rupture of ear drum, right ear: Secondary | ICD-10-CM | POA: Diagnosis not present

## 2023-12-08 MED ORDER — AMOXICILLIN-POT CLAVULANATE 875-125 MG PO TABS: 1.0000 | ORAL_TABLET | Freq: Two times a day (BID) | ORAL | 0 refills | Status: DC
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
## 2023-12-08 NOTE — Discharge Instructions (Addendum)
 Start taking Augmentin twice daily for 7 days for ear infection coverage. You can take 650 mg of Tylenol  every 4-6 hours as needed for pain. Follow-up with primary care or return here if symptoms persist or worsen.

## 2023-12-08 NOTE — ED Triage Notes (Signed)
 Patient states right ear pain and pressure for several months. Denies any other symptoms.

## 2023-12-10 NOTE — Telephone Encounter (Signed)
 No report in [facility] yet per [employer]

## 2024-02-17 ENCOUNTER — Encounter: Payer: Self-pay | Admitting: Emergency Medicine

## 2024-02-17 ENCOUNTER — Ambulatory Visit
Admission: EM | Admit: 2024-02-17 | Discharge: 2024-02-17 | Disposition: A | Discharge status: Home / Self Care | Attending: Internal Medicine | Admitting: Internal Medicine

## 2024-02-17 DIAGNOSIS — H66001 Acute suppurative otitis media without spontaneous rupture of ear drum, right ear: Secondary | ICD-10-CM | POA: Diagnosis not present

## 2024-02-17 DIAGNOSIS — H6121 Impacted cerumen, right ear: Secondary | ICD-10-CM

## 2024-02-17 MED ORDER — CEFDINIR 300 MG PO CAPS: 300.0000 mg | ORAL_CAPSULE | Freq: Two times a day (BID) | ORAL | 0 refills | Status: DC

## 2024-02-17 NOTE — ED Provider Notes (Signed)
 [facility]    CSN: [id number] Arrival date & time: 02/17/24  1246      History   Chief Complaint Chief Complaint  Patient presents with   Otalgia    HPI Riley Jefferson is a 63 y.o. male.   Riley Jefferson is a 63 y.o. male presenting for chief complaint of right ear pain and intermittent dizziness that started last night. He has felt the right ear pain for the last several days, dizziness only started last night when he tried to lay down on his right side. States dizziness only happens if he moves his head to the right and when he changes positions from sitting to laying and vice versa. Denies recent trauma/injuries to the head/ear, visual disturbance, paresthesias, neck pain, fever/chills, nausea, vomiting, headache, and unilateral extremity weakness. No recent cough/congestion or drainage from the ears. Left ear is asymptomatic. He had a right ear infection treated with Augmentin  on Dec 08, 2023, otherwise no recent antibiotic use. Takes Eloquis (Afib). He has not attempted use of any OTC medications to help with symptoms PTA.    Otalgia   Past Medical History:  Diagnosis Date   Atrial fibrillation [facility])    Hypertension     Patient Active Problem List   Diagnosis Date Noted   Epistaxis 12/28/2022   Bleeding from the nose 12/28/2022   Hypokalemia 06/09/2022   Metabolic acidosis 06/09/2022   Hypocalcemia 06/09/2022   Hypoglycemia 06/09/2022   Atrial fibrillation with slow ventricular response (HCC) 06/09/2022   Symptomatic bradycardia 06/09/2022   Chest pain 06/09/2022   Normocytic anemia 06/09/2022   Thrombocytopenia (HCC) 06/09/2022   Hypertension 06/09/2022   Atrial fibrillation (HCC) 06/09/2022    History reviewed. No pertinent surgical history.     Home Medications    Prior to Admission medications   Medication Sig Start Date End Date Taking? Authorizing Provider  cefdinir  (OMNICEF ) 300 MG capsule Take 1 capsule (300 mg total) by mouth 2 (two)  times daily. 02/17/24  Yes Enedelia Dorna HERO, FNP  acetaminophen  (TYLENOL ) 325 MG tablet Take 2 tablets (650 mg total) by mouth every 6 (six) hours as needed for mild pain (or Fever >/= 101). 06/10/22   Sheikh, Alejandro Latif, DO  apixaban  (ELIQUIS ) 5 MG TABS tablet Take 1 tablet (5 mg total) by mouth 2 (two) times daily. 10/04/23   Thukkani, Arun K, MD  empagliflozin  (JARDIANCE ) 10 MG TABS tablet Take 1 tablet (10 mg total) by mouth daily. 12/01/23   Emelia Josefa HERO, NP  furosemide  (LASIX ) 40 MG tablet Take 1 tablet (40 mg total) by mouth 2 (two) times daily. 10/04/23   Thukkani, Arun K, MD  latanoprost  (XALATAN ) 0.005 % ophthalmic solution Place 1 drop into both eyes at bedtime. 12/19/22   [provider]  losartan  (COZAAR ) 25 MG tablet Take 1 tablet (25 mg total) by mouth daily. 10/04/23   Thukkani, Arun K, MD  metoprolol  succinate (TOPROL -XL) 25 MG 24 hr tablet Take 0.5 tablets (12.5 mg total) by mouth daily. 10/04/23   Thukkani, Arun K, MD  oxyCODONE  (ROXICODONE ) 5 MG immediate release tablet Take 1 tablet (5 mg total) by mouth every 4 (four) hours as needed for severe pain. Patient not taking: Reported on 02/17/2024 04/07/23   Yolande Lamar BROCKS, MD    Family History Family History  Problem Relation Age of Onset   Stroke Father     Social History Social History   Tobacco Use   Smoking status: Former    Current packs/day:  0.00    Average packs/day: 1 pack/day for 20.0 years (20.0 ttl pk-yrs)    Types: Cigarettes    Start date: 09/21/1981    Quit date: 09/21/2001    Years since quitting: 22.4   Smokeless tobacco: Never  Vaping Use   Vaping status: Never Used  Substance Use Topics   Alcohol use: Never   Drug use: Never     Allergies   Patient has no known allergies.   Review of Systems Review of Systems  HENT:  Positive for ear pain.      Physical Exam Triage Vital Signs ED Triage Vitals  Encounter Vitals Group     BP 02/17/24 1252 130/89     Girls Systolic BP  Percentile --      Girls Diastolic BP Percentile --      Boys Systolic BP Percentile --      Boys Diastolic BP Percentile --      Pulse Rate 02/17/24 1252 62     Resp 02/17/24 1252 18     Temp 02/17/24 1252 98 F (36.7 C)     Temp Source 02/17/24 1252 Oral     SpO2 02/17/24 1252 97 %     Weight --      Height --      Head Circumference --      Peak Flow --      Pain Score 02/17/24 1255 3     Pain Loc --      Pain Education --      Exclude from Growth Chart --    No data found.  Updated Vital Signs BP 130/89 (BP Location: Left Arm)   Pulse 62   Temp 98 F (36.7 C) (Oral)   Resp 18   SpO2 97%   Visual Acuity Right Eye Distance:   Left Eye Distance:   Bilateral Distance:    Right Eye Near:   Left Eye Near:    Bilateral Near:     Physical Exam Vitals and nursing note reviewed.  Constitutional:      Appearance: He is not ill-appearing or toxic-appearing.  HENT:     Head: Normocephalic and atraumatic.     Right Ear: Hearing and external ear normal. There is impacted cerumen. Tympanic membrane is erythematous and bulging.     Left Ear: Hearing, tympanic membrane, ear canal and external ear normal.     Nose: Nose normal.     Mouth/Throat:     Lips: Pink.     Mouth: Mucous membranes are moist. No injury or oral lesions.     Dentition: Normal dentition.     Tongue: No lesions.     Pharynx: Oropharynx is clear. Uvula midline. No pharyngeal swelling, oropharyngeal exudate, posterior oropharyngeal erythema, uvula swelling or postnasal drip.     Tonsils: No tonsillar exudate.  Eyes:     General: Lids are normal. Vision grossly intact. Gaze aligned appropriately.     Extraocular Movements: Extraocular movements intact.     Conjunctiva/sclera: Conjunctivae normal.  Neck:     Trachea: Trachea and phonation normal.  Pulmonary:     Effort: Pulmonary effort is normal.  Musculoskeletal:     Cervical back: Neck supple.  Lymphadenopathy:     Cervical: Cervical adenopathy  present.  Skin:    General: Skin is warm and dry.     Capillary Refill: Capillary refill takes less than 2 seconds.     Findings: No rash.  Neurological:     General: No focal  deficit present.     Mental Status: He is alert and oriented to person, place, and time. Mental status is at baseline.     GCS: GCS eye subscore is 4. GCS verbal subscore is 5. GCS motor subscore is 6.     Cranial Nerves: Cranial nerves 2-12 are intact. No dysarthria or facial asymmetry.     Sensory: Sensation is intact.     Motor: Motor function is intact. No weakness, tremor, abnormal muscle tone or pronator drift.     Coordination: Coordination is intact. Romberg sign negative. Coordination normal. Finger-Nose-Finger Test normal.     Gait: Gait is intact.     Comments: Strength and sensation intact to bilateral upper and lower extremities (5/5). Moves all 4 extremities with normal coordination voluntarily. Non-focal neuro exam.   Psychiatric:        Mood and Affect: Mood normal.        Speech: Speech normal.        Behavior: Behavior normal.        Thought Content: Thought content normal.        Judgment: Judgment normal.      UC Treatments / Results  Labs (all labs ordered are listed, but only abnormal results are displayed) Labs Reviewed - No data to display  EKG   Radiology No results found.  Procedures Procedures (including critical care time)  Medications Ordered in UC Medications - No data to display  Initial Impression / Assessment and Plan / UC Course  I have reviewed the triage vital signs and the nursing notes.  Pertinent labs & imaging results that were available during my care of the patient were reviewed by me and considered in my medical decision making (see chart for details).   1. Non-recurrent acute suppurative otitis media of right ear without spontaneous rupture, cerumen impaction of right ear Right ear(s) cleaned with ear lavage to remove ear wax impactions bilaterally by  nursing staff.  Reassessment shows right otitis media without rupture of right TM.   He had Augmentin  in May 2025, will treat this time with cefdinir  BID for 7 days. Recommend follow-up with PCP to ensure improving symptoms.  Dizziness fully resolved after ear lavage and removal of ear wax.  Neurovascularly intact to baseline without focal deficit on exam.  Patient may use debrox ear drops at home as needed for wax removal and has been advised to avoid using Q-tips.    Counseled patient on potential for adverse effects with medications prescribed/recommended today, strict ER and return-to-clinic precautions discussed, patient verbalized understanding.    Final Clinical Impressions(s) / UC Diagnoses   Final diagnoses:  Non-recurrent acute suppurative otitis media of right ear without spontaneous rupture of tympanic membrane  Impacted cerumen of right ear     Discharge Instructions      You have an ear infection of the right ear. Take cefdinir  antibiotic as prescribed. Follow-up with PCP if symptoms worsen. Take tylenol  1,000mg  every 6 hours as needed for pain. Place cotton balls into the ears while showering to avoid excessive water buildup inside of the ear canals.   If you develop any new or worsening symptoms or if your symptoms do not start to improve, please return here or follow-up with your primary care provider. If your symptoms are severe, please go to the emergency room.      ED Prescriptions     Medication Sig Dispense Auth. Provider   cefdinir  (OMNICEF ) 300 MG capsule Take 1 capsule (300 mg  total) by mouth 2 (two) times daily. 14 capsule Enedelia Dorna HERO, FNP      PDMP not reviewed this encounter.   Enedelia Dorna HERO, [place] 02/17/24 1332

## 2024-02-17 NOTE — ED Triage Notes (Signed)
 Pt c/o right ear pain and vertigo that began last night.

## 2024-02-17 NOTE — Discharge Instructions (Signed)
 You have an ear infection of the right ear. Take cefdinir  antibiotic as prescribed. Follow-up with PCP if symptoms worsen. Take tylenol  1,000mg  every 6 hours as needed for pain. Place cotton balls into the ears while showering to avoid excessive water buildup inside of the ear canals.   If you develop any new or worsening symptoms or if your symptoms do not start to improve, please return here or follow-up with your primary care provider. If your symptoms are severe, please go to the emergency room.

## 2024-02-21 DIAGNOSIS — H938X1 Other specified disorders of right ear: Secondary | ICD-10-CM | POA: Insufficient documentation

## 2024-02-21 DIAGNOSIS — H918X3 Other specified hearing loss, bilateral: Secondary | ICD-10-CM | POA: Diagnosis not present

## 2024-02-21 DIAGNOSIS — H903 Sensorineural hearing loss, bilateral: Secondary | ICD-10-CM | POA: Diagnosis not present

## 2024-02-25 ENCOUNTER — Emergency Department (HOSPITAL_COMMUNITY)
Admission: EM | Admit: 2024-02-25 | Discharge: 2024-02-25 | Disposition: A | Discharge status: Home / Self Care | Attending: Emergency Medicine | Admitting: Emergency Medicine

## 2024-02-25 ENCOUNTER — Encounter (HOSPITAL_COMMUNITY): Payer: Self-pay | Admitting: Emergency Medicine

## 2024-02-25 ENCOUNTER — Emergency Department (HOSPITAL_COMMUNITY)

## 2024-02-25 ENCOUNTER — Other Ambulatory Visit: Payer: Self-pay

## 2024-02-25 DIAGNOSIS — Z7901 Long term (current) use of anticoagulants: Secondary | ICD-10-CM | POA: Diagnosis not present

## 2024-02-25 DIAGNOSIS — R059 Cough, unspecified: Secondary | ICD-10-CM | POA: Diagnosis not present

## 2024-02-25 DIAGNOSIS — J069 Acute upper respiratory infection, unspecified: Secondary | ICD-10-CM | POA: Insufficient documentation

## 2024-02-25 DIAGNOSIS — B9789 Other viral agents as the cause of diseases classified elsewhere: Secondary | ICD-10-CM | POA: Diagnosis not present

## 2024-02-25 LAB — CBC WITH DIFFERENTIAL/PLATELET
Abs Immature Granulocytes: 0.02 K/uL (ref 0.00–0.07)
Basophils Absolute: 0.1 K/uL (ref 0.0–0.1)
Basophils Relative: 1 %
Eosinophils Absolute: 0.2 K/uL (ref 0.0–0.5)
Eosinophils Relative: 3 %
HCT: 46.3 % (ref 39.0–52.0)
Hemoglobin: 15.5 g/dL (ref 13.0–17.0)
Immature Granulocytes: 0 %
Lymphocytes Relative: 36 %
Lymphs Abs: 2.5 K/uL (ref 0.7–4.0)
MCH: 27.9 pg (ref 26.0–34.0)
MCHC: 33.5 g/dL (ref 30.0–36.0)
MCV: 83.3 fL (ref 80.0–100.0)
Monocytes Absolute: 0.8 K/uL (ref 0.1–1.0)
Monocytes Relative: 12 %
Neutro Abs: 3.4 K/uL (ref 1.7–7.7)
Neutrophils Relative %: 48 %
Platelets: 217 K/uL (ref 150–400)
RBC: 5.56 MIL/uL (ref 4.22–5.81)
RDW: 15.8 % — ABNORMAL HIGH (ref 11.5–15.5)
WBC: 7 K/uL (ref 4.0–10.5)
nRBC: 0 % (ref 0.0–0.2)

## 2024-02-25 LAB — BASIC METABOLIC PANEL WITH GFR
Anion gap: 11 (ref 5–15)
BUN: 8 mg/dL (ref 8–23)
CO2: 21 mmol/L — ABNORMAL LOW (ref 22–32)
Calcium: 9.1 mg/dL (ref 8.9–10.3)
Chloride: 107 mmol/L (ref 98–111)
Creatinine, Ser: 1.07 mg/dL (ref 0.61–1.24)
GFR, Estimated: >60 mL/min (ref >60)
Glucose, Bld: 112 mg/dL — ABNORMAL HIGH (ref 70–99)
Potassium: 4.1 mmol/L (ref 3.5–5.1)
Sodium: 139 mmol/L (ref 135–145)

## 2024-02-25 LAB — RESP PANEL BY RT-PCR (RSV, FLU A&B, COVID)  RVPGX2
Influenza A by PCR: NEGATIVE
Influenza B by PCR: NEGATIVE
Resp Syncytial Virus by PCR: NEGATIVE
SARS Coronavirus 2 by RT PCR: NEGATIVE

## 2024-02-25 MED ORDER — ALBUTEROL SULFATE HFA 108 (90 BASE) MCG/ACT IN AERS: 2.0000 | INHALATION_SPRAY | RESPIRATORY_TRACT | 2 refills | Status: DC | PRN

## 2024-02-25 MED ORDER — HYDROCODONE BIT-HOMATROP MBR 5-1.5 MG/5ML PO SOLN: 5.0000 mL | Freq: Four times a day (QID) | ORAL | 0 refills | Status: DC | PRN

## 2024-02-25 MED ORDER — BENZONATATE 200 MG PO CAPS: 200.0000 mg | ORAL_CAPSULE | Freq: Three times a day (TID) | ORAL | 0 refills | Status: DC | PRN

## 2024-02-25 MED ORDER — BENZONATATE 100 MG PO CAPS
200.0000 mg | ORAL_CAPSULE | Freq: Once | ORAL | Status: AC
  Administered 2024-02-25: 200 mg via ORAL
  Filled 2024-02-25: qty 2

## 2024-02-25 NOTE — ED Triage Notes (Signed)
 Pt reports cough starting this evening and keeping him up tonight. Sneezing and runny nose. Denies chest pain or SOB. Chief complaint is the cough.

## 2024-02-25 NOTE — ED Provider Notes (Signed)
 [place] EMERGENCY DEPARTMENT AT [facility] Provider Note   CSN: [id number] Arrival date & time: 02/25/24  9684     Patient presents with: Cough   Riley Jefferson is a 63 y.o. male.   Patient presents to the emergency department for evaluation of cough.  Patient reports that he cannot sleep because of persistent cough.  No chest pain.  No fever.  He does not feel short of breath.  No history of chronic lung disease.       Prior to Admission medications   Medication Sig Start Date End Date Taking? Authorizing Provider  albuterol (VENTOLIN HFA) 108 (90 Base) MCG/ACT inhaler Inhale 2 puffs into the lungs every 4 (four) hours as needed for wheezing or shortness of breath. 02/25/24  Yes Alexy Heldt, Lonni PARAS, MD  benzonatate (TESSALON) 200 MG capsule Take 1 capsule (200 mg total) by mouth 3 (three) times daily as needed for cough. 02/25/24  Yes Caliyah Sieh, Lonni PARAS, MD  HYDROcodone bit-homatropine (HYCODAN) 5-1.5 MG/5ML syrup Take 5 mLs by mouth every 6 (six) hours as needed for cough. 02/25/24  Yes Ramonita Koenig, Lonni PARAS, MD  acetaminophen  (TYLENOL ) 325 MG tablet Take 2 tablets (650 mg total) by mouth every 6 (six) hours as needed for mild pain (or Fever >/= 101). 06/10/22   Sherrill Cable Latif, DO  apixaban  (ELIQUIS ) 5 MG TABS tablet Take 1 tablet (5 mg total) by mouth 2 (two) times daily. 10/04/23   Thukkani, Arun K, MD  cefdinir  (OMNICEF ) 300 MG capsule Take 1 capsule (300 mg total) by mouth 2 (two) times daily. 02/17/24   Enedelia Dorna HERO, FNP  empagliflozin  (JARDIANCE ) 10 MG TABS tablet Take 1 tablet (10 mg total) by mouth daily. 12/01/23   Emelia Josefa HERO, NP  furosemide  (LASIX ) 40 MG tablet Take 1 tablet (40 mg total) by mouth 2 (two) times daily. 10/04/23   Thukkani, Arun K, MD  latanoprost  (XALATAN ) 0.005 % ophthalmic solution Place 1 drop into both eyes at bedtime. 12/19/22   [provider]  losartan  (COZAAR ) 25 MG tablet Take 1 tablet (25 mg total) by mouth  daily. 10/04/23   Thukkani, Arun K, MD  metoprolol  succinate (TOPROL -XL) 25 MG 24 hr tablet Take 0.5 tablets (12.5 mg total) by mouth daily. 10/04/23   Thukkani, Arun K, MD  oxyCODONE  (ROXICODONE ) 5 MG immediate release tablet Take 1 tablet (5 mg total) by mouth every 4 (four) hours as needed for severe pain. Patient not taking: Reported on 02/17/2024 04/07/23   Yolande Lamar BROCKS, MD    Allergies: Patient has no known allergies.    Review of Systems  Updated Vital Signs BP (!) 179/116 (BP Location: Right Arm)   Pulse (!) 58   Temp 98.8 F (37.1 C)   Resp 20   SpO2 94%   Physical Exam Vitals and nursing note reviewed.  Constitutional:      General: He is not in acute distress.    Appearance: He is well-developed.  HENT:     Head: Normocephalic and atraumatic.     Mouth/Throat:     Mouth: Mucous membranes are moist.  Eyes:     General: Vision grossly intact. Gaze aligned appropriately.     Extraocular Movements: Extraocular movements intact.     Conjunctiva/sclera: Conjunctivae normal.  Cardiovascular:     Rate and Rhythm: Normal rate and regular rhythm.     Pulses: Normal pulses.     Heart sounds: Normal heart sounds, S1 normal and S2 normal. No murmur  heard.    No friction rub. No gallop.  Pulmonary:     Effort: Pulmonary effort is normal. No respiratory distress.     Breath sounds: Normal breath sounds.  Abdominal:     Palpations: Abdomen is soft.     Tenderness: There is no abdominal tenderness. There is no guarding or rebound.     Hernia: No hernia is present.  Musculoskeletal:        General: No swelling.     Cervical back: Full passive range of motion without pain, normal range of motion and neck supple. No pain with movement, spinous process tenderness or muscular tenderness. Normal range of motion.     Right lower leg: No edema.     Left lower leg: No edema.  Skin:    General: Skin is warm and dry.     Capillary Refill: Capillary refill takes less than 2 seconds.      Findings: No ecchymosis, erythema, lesion or wound.  Neurological:     Mental Status: He is alert and oriented to person, place, and time.     GCS: GCS eye subscore is 4. GCS verbal subscore is 5. GCS motor subscore is 6.     Cranial Nerves: Cranial nerves 2-12 are intact.     Sensory: Sensation is intact.     Motor: Motor function is intact. No weakness or abnormal muscle tone.     Coordination: Coordination is intact.  Psychiatric:        Mood and Affect: Mood normal.        Speech: Speech normal.        Behavior: Behavior normal.     (all labs ordered are listed, but only abnormal results are displayed) Labs Reviewed  BASIC METABOLIC PANEL WITH GFR - Abnormal; Notable for the following components:      Result Value   CO2 21 (*)    Glucose, Bld 112 (*)    All other components within normal limits  CBC WITH DIFFERENTIAL/PLATELET - Abnormal; Notable for the following components:   RDW 15.8 (*)    All other components within normal limits  RESP PANEL BY RT-PCR (RSV, FLU A&B, COVID)  [id number]    EKG: EKG Interpretation Date/Time:  Friday February 25 2024 03:25:07 EDT Ventricular Rate:  121 PR Interval:    QRS Duration:  106 QT Interval:  284 QTC Calculation: 403 R Axis:   30  Text Interpretation: Atrial fibrillation with rapid ventricular response Minimal voltage criteria for LVH, may be normal variant ( Cornell product ) Septal infarct , age undetermined Marked ST abnormality, possible inferior subendocardial injury Abnormal ECG When compared with ECG of 24-Nov-2023 20:29, PREVIOUS ECG IS PRESENT Confirmed by Haze Lonni PARAS [phone]) on 02/25/2024 5:26:06 AM  Radiology: [facility] Chest 2 View Result Date: 02/25/2024 CLINICAL DATA:  Cough EXAM: CHEST - 2 VIEW COMPARISON:  11/24/2023 FINDINGS: Cardiac shadow is stable. Lungs are well aerated bilaterally. No focal infiltrate or effusion is seen. No acute bony abnormality is noted. IMPRESSION: No active cardiopulmonary disease.  Electronically Signed   By: Oneil Devonshire M.D.   On: 02/25/2024 03:43     Procedures   Medications Ordered in the ED - No data to display                                  Medical Decision Making Amount and/or Complexity of Data Reviewed Labs: ordered. Radiology: ordered.  Differential Diagnosis considered includes, but not limited to: COVID-19; influenza; RSV; simple viral URI; strep pharyngitis; pneumonia  Patient presents to the emergency department for evaluation of cough and congestion.  Patient appears well.  No hypoxia.  Workup has been reassuring, no leukocytosis, chest x-ray clear.  Negative COVID and influenza.  Treat symptomatically.  A chart review reveals that the patient is currently on Omnicef , given at urgent care for an ear infection.     Final diagnoses:  Viral URI with cough    ED Discharge Orders          Ordered    benzonatate (TESSALON) 200 MG capsule  3 times daily PRN        02/25/24 0535    HYDROcodone bit-homatropine (HYCODAN) 5-1.5 MG/5ML syrup  Every 6 hours PRN        02/25/24 0535    albuterol (VENTOLIN HFA) 108 (90 Base) MCG/ACT inhaler  Every 4 hours PRN        02/25/24 0535               Haze Lonni PARAS, MD 02/25/24 [phone]

## 2024-02-25 NOTE — Discharge Instructions (Signed)
 Make sure you take all of your routine medications when you get home this morning.

## 2024-03-06 NOTE — Progress Notes (Unsigned)
 Cardiology Office Note:   Date:  03/15/2024  ID:  Riley Jefferson, DOB Apr 18, 1961, MRN [id number] PCP:  System, Provider Not In  [facility] Providers Cardiologist:  Wendel Haws, MD Referring MD: No ref. provider found  Chief Complaint/Reason for Referral:  Cardiology follow-up  ASSESSMENT:    1. Chronic combined systolic and diastolic heart failure (HCC)   2. Paroxysmal atrial fibrillation (HCC)   3. Secondary hypercoagulable state (HCC)   4. BMI 37.0-37.9, adult   5. Daytime somnolence      PLAN:   In order of problems listed above: Systolic and diastolic heart failure: Restart Jardiance  10 mg, losartan  25 mg, Lasix  40 mg twice daily; defer Toprol  for now given heart rate of 60.  Follow-up in 1 month.  Will need echocardiogram in the future. Paroxysmal atrial fibrillation: Restart Eliquis  5 mg twice daily Secondary hypercoagulable state: Restart Eliquis  5 mg twice daily Elevated BMI: We will check hemoglobin A1c.  If this is the case we will refer to pharmacy for recommendations regarding GLP-1 receptor agonist therapy. Snoring: Will discuss sleep study at future visit            Dispo:  Return in about 4 weeks (around 04/12/2024).      Medication Adjustments/Labs and Tests Ordered: Current medicines are reviewed at length with the patient today.  Concerns regarding medicines are outlined above.  The following changes have been made:     Labs/tests ordered: Orders Placed This Encounter  Procedures   HgB A1c    Medication Changes: Meds ordered this encounter  Medications   empagliflozin  (JARDIANCE ) 10 MG TABS tablet    Sig: Take 1 tablet (10 mg total) by mouth daily.    Dispense:  90 tablet    Refill:  2   losartan  (COZAAR ) 25 MG tablet    Sig: Take 1 tablet (25 mg total) by mouth daily.    Dispense:  90 tablet    Refill:  2    Stop Entresto  due to cost   furosemide  (LASIX ) 40 MG tablet    Sig: Take 1 tablet (40 mg total) by mouth 2 (two) times daily.     Dispense:  180 tablet    Refill:  2    Increased to twice daily   apixaban  (ELIQUIS ) 5 MG TABS tablet    Sig: Take 1 tablet (5 mg total) by mouth 2 (two) times daily.    Dispense:  60 tablet    Refill:  6    Current medicines are reviewed at length with the patient today.  The patient does not have concerns regarding medicines.  I spent 31 minutes reviewing all clinical data during and prior to this visit including all relevant imaging studies, laboratories, clinical information from other health systems, and prior notes from both Cardiology and other specialties, interviewing the patient, and conducting a complete physical examination in order to formulate a comprehensive and personalized evaluation and treatment plan.  History of Present Illness:      FOCUSED PROBLEM LIST:   Paroxysmal atrial fibrillation CV 2 score 2 On Eliquis  Nonischemic cardiomyopathy EF 40 to 45% TTE 2023 PET stress 2022 negative [name], [place]) EF 40 to 45% TTE 2020 [name], [place]) BMI 37 Daytime somnolence Stop bang score 7  08/13/2022 visit: The patient is a 63 y.o. male with the indicated medical history here for cardiology follow up.  The patient had been admitted with acute on chronic combined diastolic and systolic heart failure in November.  His troponins and  BNP were normal.  He was started on lasix  20mg  and discharged.  He was seen in November in the outpatient clinic with dyspnea.  He was not able to start Farxiga or Entresto  and LCSW was contacted.  He was seen in December and was noted to be disoriented and reported breathing difficulty when stressed.   Today he tells me that he has been more short of breath of late.  He tells me his weight has been going up over the last several weeks.  He reports dyspnea with exertion and orthopnea as well as some paroxysmal nocturnal dyspnea.  He denies any presyncope or syncope.  He has some chest heaviness at times but this is not routine.  He fortunately has not  required any emergency room visits or hospitalizations.  He has been compliant with his medical therapy.   He does tell me that he snores and endorses daytime somnolence.  Plan: Start Jardiance  10 mg daily and increase Lasix  to 60 mg twice daily for 3 days then down to a new chronic dose of 40 mg twice a day with a BMP in 1 week; refer to pharmacy for elevated BMI; refer for sleep apnea evaluation given snoring.  Follow-up in 1 week.  November 2024: The patient is seen for routine follow-up.  She was last seen in May of this year.  At that time he reported not starting Jardiance  or Entresto  as he was concerned about kidney dysfunction.  His Jardiance  and Entresto  were resumed and it was noted that his blood pressure was quite elevated.  He never got his sleep study so arrangements were made for this to occur.  He was seen in the emergency department in June due to a nosebleed.  This was thought to be due to high blood pressure and Eliquis .  ENT manage this.  The patient was discharged on his home medications with a plan to restart his Eliquis .  The patient never restarted his Eliquis .  He is having trouble getting his medications such as Jardiance  and Entresto .  He is only taking Lasix  and Toprol -XL.  He tells me that at times he is somewhat short of breath.  This does not happen all the time.  He tells me sometimes when he walks he becomes short of breath and has to stop and take a rest.  His weight has not changed.  He has no problems with orthopnea.  He has not noticed any significant peripheral edema.  Plan: Start losartan  25 mg, increase Lasix  to 40 twice daily, check BMP, check hemoglobin A1c and consider referral to pharmacy for GLP-1 receptor agonist therapy.  August 2025:  Patient consents to use of AI scribe. The patient returns for routine follow-up.  He did not did not have a hemoglobin A1c or BMP checked after my last visit.  He did not have a sleep study performed.  He was seen in atrial  fibrillation clinic in January 2025.  He was doing well referred for sleep study.  This was not performed.  He has discontinued several prescribed medications, including Eliquis , Jardiance , Lasix , losartan , and Toprol , and has been attempting to manage his condition with herbal supplements. He experiences persistent shortness of breath, which has not worsened but remains a concern. He has orthopnea, experiencing difficulty breathing when lying flat, a symptom that has been present for some time without progression. No leg swelling, palpitations, recent hospitalizations, or emergency room visits.         Current Medications: No outpatient medications  have been marked as taking for the 03/15/24 encounter (Office Visit) with Geraldy Akridge K, MD.     Review of Systems:   Please see the history of present illness.    All other systems reviewed and are negative.     EKGs/Labs/Other Test Reviewed:   EKG: 2025 atrial fibrillation with rapid ventricular rate and LVH  EKG Interpretation Date/Time:    Ventricular Rate:    PR Interval:    QRS Duration:    QT Interval:    QTC Calculation:   R Axis:      Text Interpretation:           Risk Assessment/Calculations:    CHA2DS2-VASc Score = 2   This indicates a 2.2% annual risk of stroke. The patient's score is based upon: CHF History: 0 HTN History: 1 Diabetes History: 0 Stroke History: 0 Vascular Disease History: 0 Age Score: 1 Gender Score: 0     STOP-Bang Score:  7       Physical Exam:   VS:  BP (!) 150/70   Pulse 60   Ht 6' 4 (1.93 m)   Wt 296 lb (134.3 kg)   SpO2 98%   BMI 36.03 kg/m    HYPERTENSION CONTROL Vitals:   03/15/24 1412 03/15/24 1428  BP: (!) 154/72 (!) 150/70    The patient's blood pressure is elevated above target today.  In order to address the patient's elevated BP: A new medication was prescribed today.      Wt Readings from Last 3 Encounters:  03/15/24 296 lb (134.3 kg)  12/07/23 296 lb  (134.3 kg)  12/01/23 296 lb (134.3 kg)      GENERAL:  No apparent distress, AOx3 HEENT:  No carotid bruits, +2 carotid impulses, no scleral icterus CAR: Irregular RR no murmurs, gallops, rubs, or thrills RES:  Clear to auscultation bilaterally ABD:  Soft, nontender, nondistended, positive bowel sounds x 4 VASC:  +2 radial pulses, +2 carotid pulses NEURO:  CN 2-12 grossly intact; motor and sensory grossly intact PSYCH:  No active depression or anxiety EXT:  No edema, ecchymosis, or cyanosis  Signed, Taegen Lennox K Desera Graffeo, MD  03/15/2024 2:28 PM    [facility] [address] [place], [place], [place]  [address] Phone: [phone]; Fax: [phone]   Note:  This document was prepared using Dragon voice recognition software and may include unintentional dictation errors.

## 2024-03-15 ENCOUNTER — Ambulatory Visit: Attending: Cardiology | Admitting: Internal Medicine

## 2024-03-15 ENCOUNTER — Other Ambulatory Visit (HOSPITAL_COMMUNITY): Payer: Self-pay

## 2024-03-15 ENCOUNTER — Encounter: Payer: Self-pay | Admitting: Internal Medicine

## 2024-03-15 VITALS — BP 150/70 | HR 60 | Ht 76.0 in | Wt 296.0 lb

## 2024-03-15 DIAGNOSIS — R400 Somnolence: Secondary | ICD-10-CM

## 2024-03-15 DIAGNOSIS — R7303 Prediabetes: Secondary | ICD-10-CM | POA: Diagnosis not present

## 2024-03-15 DIAGNOSIS — I480 Paroxysmal atrial fibrillation: Secondary | ICD-10-CM

## 2024-03-15 DIAGNOSIS — D6869 Other thrombophilia: Secondary | ICD-10-CM

## 2024-03-15 DIAGNOSIS — I5042 Chronic combined systolic (congestive) and diastolic (congestive) heart failure: Secondary | ICD-10-CM | POA: Diagnosis not present

## 2024-03-15 DIAGNOSIS — Z6837 Body mass index (BMI) 37.0-37.9, adult: Secondary | ICD-10-CM

## 2024-03-15 LAB — HEMOGLOBIN A1C
Est. average glucose Bld gHb Est-mCnc: 126 mg/dL
Hgb A1c MFr Bld: 6 % — ABNORMAL HIGH (ref 4.8–5.6)

## 2024-03-15 MED ORDER — APIXABAN 5 MG PO TABS
5.0000 mg | ORAL_TABLET | Freq: Two times a day (BID) | ORAL | 6 refills | Status: DC
  Filled 2024-03-15: qty 60, 30d supply, fill #0

## 2024-03-15 MED ORDER — FUROSEMIDE 40 MG PO TABS
40.0000 mg | ORAL_TABLET | Freq: Two times a day (BID) | ORAL | 2 refills | Status: DC
  Filled 2024-03-15: qty 180, 90d supply, fill #0

## 2024-03-15 MED ORDER — LOSARTAN POTASSIUM 25 MG PO TABS
25.0000 mg | ORAL_TABLET | Freq: Every day | ORAL | 2 refills | Status: DC
Start: 2024-03-15 — End: 2024-06-07
  Filled 2024-03-15: qty 90, 90d supply, fill #0

## 2024-03-15 MED ORDER — EMPAGLIFLOZIN 10 MG PO TABS
10.0000 mg | ORAL_TABLET | Freq: Every day | ORAL | 2 refills | Status: DC
  Filled 2024-03-15: qty 90, 90d supply, fill #0

## 2024-03-15 NOTE — Patient Instructions (Signed)
 Medication Instructions:  Your physician recommends that you continue on your current medications as directed. Please refer to the Current Medication list given to you today.  *If you need a refill on your cardiac medications before your next appointment, please call your pharmacy*  Lab Work: Today: A1C  If you have any lab test that is abnormal or we need to change your treatment, we will call you to review the results.  Testing/Procedures: None ordered  Follow-Up: At [facility], you and your health needs are our priority.  As part of our continuing mission to provide you with exceptional heart care, our providers are all part of one team.  This team includes your primary Cardiologist (physician) and Advanced Practice Providers or APPs (Physician Assistants and Nurse Practitioners) who all work together to provide you with the care you need, when you need it.  Your next appointment:   1 month(s)  Provider:   Arun K Thukkani, MD     Thank you for choosing [facility]!!   [phone]

## 2024-03-16 ENCOUNTER — Ambulatory Visit: Payer: Self-pay | Admitting: Internal Medicine

## 2024-04-04 ENCOUNTER — Other Ambulatory Visit: Payer: Self-pay

## 2024-04-04 ENCOUNTER — Emergency Department (HOSPITAL_COMMUNITY)
Admission: EM | Admit: 2024-04-04 | Discharge: 2024-04-05 | Disposition: A | Discharge status: Home / Self Care | Attending: Emergency Medicine | Admitting: Emergency Medicine

## 2024-04-04 DIAGNOSIS — W1830XA Fall on same level, unspecified, initial encounter: Secondary | ICD-10-CM | POA: Diagnosis not present

## 2024-04-04 DIAGNOSIS — I10 Essential (primary) hypertension: Secondary | ICD-10-CM | POA: Diagnosis not present

## 2024-04-04 DIAGNOSIS — Z87891 Personal history of nicotine dependence: Secondary | ICD-10-CM | POA: Diagnosis not present

## 2024-04-04 DIAGNOSIS — Z79899 Other long term (current) drug therapy: Secondary | ICD-10-CM | POA: Diagnosis not present

## 2024-04-04 DIAGNOSIS — W19XXXA Unspecified fall, initial encounter: Secondary | ICD-10-CM

## 2024-04-04 DIAGNOSIS — M545 Low back pain, unspecified: Secondary | ICD-10-CM | POA: Diagnosis present

## 2024-04-04 DIAGNOSIS — Z7901 Long term (current) use of anticoagulants: Secondary | ICD-10-CM | POA: Diagnosis not present

## 2024-04-04 NOTE — ED Triage Notes (Signed)
 Patient c/o L and central lower back pain x 1 week that is lingering. Patient reports problem like this before but it got better and this time it is not. Patient appears to have difficulty walking d/t pain but reports no numbness, tingling, or changes to urination.

## 2024-04-05 ENCOUNTER — Emergency Department (HOSPITAL_COMMUNITY)

## 2024-04-05 DIAGNOSIS — M545 Low back pain, unspecified: Secondary | ICD-10-CM | POA: Diagnosis not present

## 2024-04-05 MED ORDER — HYDROCODONE-ACETAMINOPHEN 5-325 MG PO TABS: 1.0000 | ORAL_TABLET | ORAL | 0 refills | Status: DC | PRN

## 2024-04-05 MED ORDER — LIDOCAINE 5 % EX PTCH
1.0000 | MEDICATED_PATCH | CUTANEOUS | Status: DC
  Administered 2024-04-05: 1 via TRANSDERMAL
  Filled 2024-04-05: qty 1

## 2024-04-05 MED ORDER — HYDROCODONE-ACETAMINOPHEN 5-325 MG PO TABS
1.0000 | ORAL_TABLET | Freq: Once | ORAL | Status: AC
  Administered 2024-04-05: 1 via ORAL
  Filled 2024-04-05: qty 1

## 2024-04-05 NOTE — ED Provider Triage Note (Signed)
 Emergency Medicine Provider Triage Evaluation Note  Tanor Glaspy , a 63 y.o. male  was evaluated in triage.  Pt complains of low back pain.  He reports a mechanical fall approximately 1 week ago.  The patient is on Eliquis  but denies hitting his head.  Review of Systems  Positive:  Negative:   Physical Exam  BP (!) 155/87   Pulse (!) 52   Temp 98 F (36.7 C)   Resp 18   Ht 6' 4 (1.93 m)   Wt 133.8 kg   SpO2 99%   BMI 35.91 kg/m  Gen:   Awake, no distress   Resp:  Normal effort  MSK:   Moves extremities without difficulty  Other:  Midline  lumbar spinal tenderness  Medical Decision Making  Medically screening exam initiated at 1:14 AM.  Appropriate orders placed.  Llewellyn Choplin was informed that the remainder of the evaluation will be completed by another provider, this initial triage assessment does not replace that evaluation, and the importance of remaining in the ED until their evaluation is complete.  Lumbar CT ordered, Norco ordered.   Logan Ubaldo NOVAK, PA-C 04/05/24 [phone]

## 2024-04-05 NOTE — Discharge Instructions (Addendum)
###   Low Back Pain Discharge     You had a fall and now have pain in the middle of your lower back. Your CT scan did not show any broken bones or serious injury. Here are some important things to know as you recover:      **What to Expect**      - Most people with this kind of back pain get better in a few weeks.      - It is normal to have some pain or soreness for a while.      **Activity**      - Try to stay active and do your normal activities as much as you can.      - Avoid staying in bed for long periods. Moving around helps you heal faster.      **Pain Relief**      - You have been prescribed Norco for pain. Take it only as directed. Do not take more than prescribed.      - Norco can cause side effects like sleepiness and constipation. Do not drive or use heavy machinery if you feel drowsy.      - You can also take acetaminophen  (Tylenol ) for pain. Follow the instructions on the bottle and do not take more than the recommended amount in 24 hours.      - Do **not** take any nonsteroidal anti-inflammatory drugs (NSAIDs) like ibuprofen (Advil, Motrin) or naproxen (Aleve) because you are taking Eliquis , which can increase your risk of bleeding.      **Other Tips**      - Using a heating pad on your lower back may help with pain.      - If you have trouble moving or the pain gets worse, let your doctor know.      **When to Get Help Right Away**      Call your doctor or go to the emergency room if you have:      - New numbness, tingling, or weakness in your legs      - Trouble controlling your bladder or bowels      - Severe or worsening pain      - Signs of bleeding, like black stools or blood in your urine      **Follow-Up**      - If your pain is not better in a few weeks, or if you have any concerns, make an appointment with your doctor.      Take your medicines as directed and stay as active as you can. Most people recover well from this type of back pain.

## 2024-04-05 NOTE — ED Provider Notes (Signed)
 [place] EMERGENCY DEPARTMENT AT [facility] Provider Note   CSN: [id number] Arrival date & time: 04/04/24  2009     Patient presents with: Back Pain   Riley Jefferson is a 63 y.o. male.  Patient with past medical history significant atrial fibrillation, hypertension, Eliquis  usage presents the emergency department complaining of 1 week of low back pain.  He reports a mechanical fall approximately 1 week ago and has had pain since that time.  He denies associated numbness, tingling.  He denies urinary symptoms such as incontinence/retention.  Denies fecal incontinence.  Patient is ambulatory.  He denies other injuries including hitting his head.    Back Pain      Prior to Admission medications   Medication Sig Start Date End Date Taking? Authorizing Provider  HYDROcodone -acetaminophen  (NORCO/VICODIN) 5-325 MG tablet Take 1 tablet by mouth every 4 (four) hours as needed. 04/05/24  Yes Logan Ubaldo NOVAK, PA-C  acetaminophen  (TYLENOL ) 325 MG tablet Take 2 tablets (650 mg total) by mouth every 6 (six) hours as needed for mild pain (or Fever >/= 101). Patient not taking: Reported on 03/15/2024 06/10/22   Sherrill Cable Latif, DO  albuterol  (VENTOLIN  HFA) 108 [phone] Base) MCG/ACT inhaler Inhale 2 puffs into the lungs every 4 (four) hours as needed for wheezing or shortness of breath. Patient not taking: Reported on 03/15/2024 02/25/24   Haze Lonni PARAS, MD  apixaban  (ELIQUIS ) 5 MG TABS tablet Take 1 tablet (5 mg total) by mouth 2 (two) times daily. 03/15/24   Thukkani, Arun K, MD  benzonatate  (TESSALON ) 200 MG capsule Take 1 capsule (200 mg total) by mouth 3 (three) times daily as needed for cough. Patient not taking: Reported on 03/15/2024 02/25/24   Haze Lonni PARAS, MD  cefdinir  (OMNICEF ) 300 MG capsule Take 1 capsule (300 mg total) by mouth 2 (two) times daily. Patient not taking: Reported on 03/15/2024 02/17/24   Enedelia Dorna HERO, FNP  empagliflozin  (JARDIANCE ) 10 MG TABS  tablet Take 1 tablet (10 mg total) by mouth daily. 03/15/24   Thukkani, Arun K, MD  furosemide  (LASIX ) 40 MG tablet Take 1 tablet (40 mg total) by mouth 2 (two) times daily. 03/15/24   Thukkani, Arun K, MD  HYDROcodone  bit-homatropine (HYCODAN) 5-1.5 MG/5ML syrup Take 5 mLs by mouth every 6 (six) hours as needed for cough. Patient not taking: Reported on 03/15/2024 02/25/24   Haze Lonni PARAS, MD  latanoprost  (XALATAN ) 0.005 % ophthalmic solution Place 1 drop into both eyes at bedtime. Patient not taking: Reported on 03/15/2024 12/19/22   [provider]  losartan  (COZAAR ) 25 MG tablet Take 1 tablet (25 mg total) by mouth daily. 03/15/24   Thukkani, Arun K, MD  metoprolol  succinate (TOPROL -XL) 25 MG 24 hr tablet Take 0.5 tablets (12.5 mg total) by mouth daily. Patient not taking: Reported on 03/15/2024 10/04/23   Thukkani, Arun K, MD  oxyCODONE  (ROXICODONE ) 5 MG immediate release tablet Take 1 tablet (5 mg total) by mouth every 4 (four) hours as needed for severe pain. Patient not taking: Reported on 03/15/2024 04/07/23   Yolande Lamar BROCKS, MD    Allergies: Patient has no known allergies.    Review of Systems  Musculoskeletal:  Positive for back pain.    Updated Vital Signs BP (!) 155/104 (BP Location: Right Arm)   Pulse 72   Temp 97.8 F (36.6 C) (Oral)   Resp 16   Ht 6' 4 (1.93 m)   Wt 133.8 kg   SpO2 99%  BMI 35.91 kg/m   Physical Exam Vitals and nursing note reviewed.  Constitutional:      General: He is not in acute distress.    Appearance: He is well-developed.  HENT:     Head: Normocephalic and atraumatic.  Eyes:     Conjunctiva/sclera: Conjunctivae normal.  Cardiovascular:     Rate and Rhythm: Normal rate and regular rhythm.     Heart sounds: No murmur heard. Pulmonary:     Effort: Pulmonary effort is normal. No respiratory distress.     Breath sounds: Normal breath sounds.  Abdominal:     Palpations: Abdomen is soft.     Tenderness: There is no abdominal  tenderness.  Musculoskeletal:        General: Tenderness present. No swelling.     Cervical back: Neck supple.     Comments: Midline lumbar spinal tenderness  Skin:    General: Skin is warm and dry.     Capillary Refill: Capillary refill takes less than 2 seconds.  Neurological:     Mental Status: He is alert.  Psychiatric:        Mood and Affect: Mood normal.     (all labs ordered are listed, but only abnormal results are displayed) Labs Reviewed - No data to display  EKG: None  Radiology: CT Lumbar Spine Wo Contrast Result Date: 04/05/2024 CLINICAL DATA:  Fall, back pain EXAM: CT LUMBAR SPINE WITHOUT CONTRAST TECHNIQUE: Multidetector CT imaging of the lumbar spine was performed without intravenous contrast administration. Multiplanar CT image reconstructions were also generated. RADIATION DOSE REDUCTION: This exam was performed according to the departmental dose-optimization program which includes automated exposure control, adjustment of the mA and/or kV according to patient size and/or use of iterative reconstruction technique. COMPARISON:  None Available. FINDINGS: Segmentation: 5 lumbar type vertebrae. Alignment: Normal. Vertebrae: No acute fracture or focal pathologic process. Paraspinal and other soft tissues: Negative Disc levels: No disc herniation. Mild degenerative facet disease in the lower lumbar spine. Disc spaces maintained. IMPRESSION: No acute bony abnormality. Electronically Signed   By: Franky Crease M.D.   On: 04/05/2024 01:46     Procedures   Medications Ordered in the ED  lidocaine  (LIDODERM ) 5 % 1 patch (1 patch Transdermal Patch Applied 04/05/24 0209)  HYDROcodone -acetaminophen  (NORCO/VICODIN) 5-325 MG per tablet 1 tablet (1 tablet Oral Given 04/05/24 0114)                                    Medical Decision Making Amount and/or Complexity of Data Reviewed Radiology: ordered.  Risk Prescription drug management.   This patient presents to the ED for  concern of back pain, this involves an extensive number of treatment options, and is a complaint that carries with it a high risk of complications and morbidity.  The differential diagnosis includes fracture, dislocation, soft tissue injury, cauda equina, others   Co morbidities / Chronic conditions that complicate the patient evaluation  Eliquis  usage   Additional history obtained:  Additional history obtained from EMR   Imaging Studies ordered:  I ordered imaging studies including CT of lumbar spine I independently visualized and interpreted imaging which showed no acute findings I agree with the radiologist interpretation    Problem List / ED Course / Critical interventions / Medication management   I ordered medication including Norco Reevaluation of the patient after these medicines showed that the patient improved I have reviewed the patients  home medicines and have made adjustments as needed   Social Determinants of Health:  Patient is a former smoker   Test / Admission - Considered:  Patient with no acute findings on CT scan.  No red flag symptoms to suggest cauda equina such as neurologic deficit, saddle anesthesia, urinary incontinence or retention, fecal incontinence.  Patient to be discharged home with instructions for back pain management.  Short course of Norco provided as patient is unable to take NSAIDs due to Eliquis  usage.  Return precautions provided.  Discharge home.      Final diagnoses:  Acute midline low back pain without sciatica  Fall, initial encounter    ED Discharge Orders          Ordered    HYDROcodone -acetaminophen  (NORCO/VICODIN) 5-325 MG tablet  Every 4 hours PRN        04/05/24 0216               Logan Ubaldo NOVAK, PA-C 04/05/24 9781    Midge Golas, MD 04/05/24 [phone]

## 2024-04-06 ENCOUNTER — Emergency Department (HOSPITAL_COMMUNITY)
Admission: EM | Admit: 2024-04-06 | Discharge: 2024-04-07 | Disposition: A | Discharge status: Home / Self Care | Attending: Emergency Medicine | Admitting: Emergency Medicine

## 2024-04-06 ENCOUNTER — Encounter (HOSPITAL_COMMUNITY): Payer: Self-pay | Admitting: *Deleted

## 2024-04-06 ENCOUNTER — Other Ambulatory Visit: Payer: Self-pay

## 2024-04-06 DIAGNOSIS — I482 Chronic atrial fibrillation, unspecified: Secondary | ICD-10-CM | POA: Diagnosis not present

## 2024-04-06 DIAGNOSIS — Z7901 Long term (current) use of anticoagulants: Secondary | ICD-10-CM | POA: Diagnosis not present

## 2024-04-06 DIAGNOSIS — M549 Dorsalgia, unspecified: Secondary | ICD-10-CM | POA: Diagnosis present

## 2024-04-06 DIAGNOSIS — R109 Unspecified abdominal pain: Secondary | ICD-10-CM | POA: Diagnosis not present

## 2024-04-06 LAB — CBC
HCT: 46.1 % (ref 39.0–52.0)
Hemoglobin: 15.3 g/dL (ref 13.0–17.0)
MCH: 28.3 pg (ref 26.0–34.0)
MCHC: 33.2 g/dL (ref 30.0–36.0)
MCV: 85.2 fL (ref 80.0–100.0)
Platelets: 234 K/uL (ref 150–400)
RBC: 5.41 MIL/uL (ref 4.22–5.81)
RDW: 16 % — ABNORMAL HIGH (ref 11.5–15.5)
WBC: 7.1 K/uL (ref 4.0–10.5)
nRBC: 0 % (ref 0.0–0.2)

## 2024-04-06 LAB — BASIC METABOLIC PANEL WITH GFR
Anion gap: 9 (ref 5–15)
BUN: 19 mg/dL (ref 8–23)
CO2: 24 mmol/L (ref 22–32)
Calcium: 9.3 mg/dL (ref 8.9–10.3)
Chloride: 103 mmol/L (ref 98–111)
Creatinine, Ser: 1.09 mg/dL (ref 0.61–1.24)
GFR, Estimated: >60 mL/min (ref >60)
Glucose, Bld: 92 mg/dL (ref 70–99)
Potassium: 4.2 mmol/L (ref 3.5–5.1)
Sodium: 136 mmol/L (ref 135–145)

## 2024-04-06 LAB — URINALYSIS, ROUTINE W REFLEX MICROSCOPIC
Bilirubin Urine: NEGATIVE
Glucose, UA: 150 mg/dL — AB
Hgb urine dipstick: NEGATIVE
Ketones, ur: NEGATIVE mg/dL
Leukocytes,Ua: NEGATIVE
Nitrite: NEGATIVE
Protein, ur: NEGATIVE mg/dL
Specific Gravity, Urine: 1.028 (ref 1.005–1.030)
pH: 5 (ref 5.0–8.0)

## 2024-04-06 NOTE — ED Triage Notes (Signed)
 Pt having left sided flank pain x 2 weeks. Reports intermittent issues with urination. No hx of kidney stones

## 2024-04-06 NOTE — ED Triage Notes (Signed)
 Pt states his kidneys are hurting, that his flow has changed over the past 3 weeks. Pt independently ambulated into lobby with no obvious signs of distress.

## 2024-04-07 ENCOUNTER — Emergency Department (HOSPITAL_COMMUNITY)

## 2024-04-07 DIAGNOSIS — R109 Unspecified abdominal pain: Secondary | ICD-10-CM | POA: Diagnosis not present

## 2024-04-07 LAB — LIPASE, BLOOD: Lipase: 29 U/L (ref 11–51)

## 2024-04-07 LAB — HEPATIC FUNCTION PANEL
ALT: 27 U/L (ref 0–44)
AST: 32 U/L (ref 15–41)
Albumin: 3.5 g/dL (ref 3.5–5.0)
Alkaline Phosphatase: 88 U/L (ref 38–126)
Bilirubin, Direct: 0.3 mg/dL — ABNORMAL HIGH (ref 0.0–0.2)
Indirect Bilirubin: 0.7 mg/dL (ref 0.3–0.9)
Total Bilirubin: 1 mg/dL (ref 0.0–1.2)
Total Protein: 7.7 g/dL (ref 6.5–8.1)

## 2024-04-07 LAB — I-STAT CHEM 8, ED
BUN: 27 mg/dL — ABNORMAL HIGH (ref 8–23)
Calcium, Ion: 1.13 mmol/L — ABNORMAL LOW (ref 1.15–1.40)
Chloride: 108 mmol/L (ref 98–111)
Creatinine, Ser: 1 mg/dL (ref 0.61–1.24)
Glucose, Bld: 82 mg/dL (ref 70–99)
HCT: 49 % (ref 39.0–52.0)
Hemoglobin: 16.7 g/dL (ref 13.0–17.0)
Potassium: 3.8 mmol/L (ref 3.5–5.1)
Sodium: 141 mmol/L (ref 135–145)
TCO2: 26 mmol/L (ref 22–32)

## 2024-04-07 MED ORDER — IOHEXOL 350 MG/ML SOLN
75.0000 mL | Freq: Once | INTRAVENOUS | Status: AC | PRN
  Administered 2024-04-07: 75 mL via INTRAVENOUS

## 2024-04-07 MED ORDER — FENTANYL CITRATE PF 50 MCG/ML IJ SOSY
50.0000 ug | PREFILLED_SYRINGE | Freq: Once | INTRAMUSCULAR | Status: AC
  Administered 2024-04-07: 50 ug via INTRAVENOUS
  Filled 2024-04-07: qty 1

## 2024-04-07 NOTE — ED Notes (Signed)
 Around 4am patient's pulse rate was at 38 then it rose to 71. At the moment patient's pulse rate is fluctuating between 30 to 70 bpm.

## 2024-04-07 NOTE — ED Notes (Addendum)
 Pt states he has history afib. Pt's heart rate was 38 when reassessed. Pt placed on dinamap while notifying ED charge of STAT need for a room. Pt's heart rate would increase to 80 then decrease back down to 40. PT states he feels okay however he is diaphoretic & SOB.  Obtaining EKG & then moving to ED room 21.

## 2024-04-07 NOTE — Discharge Instructions (Addendum)
 While you were here in the emergency room, you had blood work done that was normal.  Your CT imaging was negative.  Continue taking all medications as prescribed.  Please discuss your metoprolol  dosing with your primary care doctor.

## 2024-04-07 NOTE — ED Provider Notes (Signed)
 [place] EMERGENCY DEPARTMENT AT [facility] Provider Note   CSN: [id number] Arrival date & time: 04/06/24  2101     Patient presents with: Dysuria and Flank Pain   Phenix Vandermeulen is a 63 y.o. male.   63 year old male with history of A-fib on Eliquis  and metoprolol  that presents ER today with back pain.  Patient states that he has been having some left-sided flank pain that radiates around a little bit for the last week or so.  Comes intermittently.  Is deafly worse with movement.  He was seen on the 10th for the same thing.  A CT lumbar that was negative.  Apparently while patient was in the waiting room he had episodes of asymptomatic bradycardia so was brought back quicker.  Says that he was diaphoretic however patient denies being sweaty at all. He denies any episodic chest pain, light headedness, etc. Denies urinary changes. Did fall prior to symptoms started. States the symptoms have improved now. States he does his own medications and only change is that he missed his evening dose yesterday because he was here.    Dysuria Presenting symptoms: dysuria   Associated symptoms: flank pain   Flank Pain       Prior to Admission medications   Medication Sig Start Date End Date Taking? Authorizing Provider  acetaminophen  (TYLENOL ) 325 MG tablet Take 2 tablets (650 mg total) by mouth every 6 (six) hours as needed for mild pain (or Fever >/= 101). Patient not taking: Reported on 03/15/2024 06/10/22   Sherrill Cable Latif, DO  albuterol  (VENTOLIN  HFA) 108 [phone] Base) MCG/ACT inhaler Inhale 2 puffs into the lungs every 4 (four) hours as needed for wheezing or shortness of breath. Patient not taking: Reported on 03/15/2024 02/25/24   Haze Lonni PARAS, MD  apixaban  (ELIQUIS ) 5 MG TABS tablet Take 1 tablet (5 mg total) by mouth 2 (two) times daily. 03/15/24   Thukkani, Arun K, MD  benzonatate  (TESSALON ) 200 MG capsule Take 1 capsule (200 mg total) by mouth 3 (three) times daily as  needed for cough. Patient not taking: Reported on 03/15/2024 02/25/24   Haze Lonni PARAS, MD  cefdinir  (OMNICEF ) 300 MG capsule Take 1 capsule (300 mg total) by mouth 2 (two) times daily. Patient not taking: Reported on 03/15/2024 02/17/24   Enedelia Dorna HERO, FNP  empagliflozin  (JARDIANCE ) 10 MG TABS tablet Take 1 tablet (10 mg total) by mouth daily. 03/15/24   Thukkani, Arun K, MD  furosemide  (LASIX ) 40 MG tablet Take 1 tablet (40 mg total) by mouth 2 (two) times daily. 03/15/24   Thukkani, Arun K, MD  HYDROcodone  bit-homatropine (HYCODAN) 5-1.5 MG/5ML syrup Take 5 mLs by mouth every 6 (six) hours as needed for cough. Patient not taking: Reported on 03/15/2024 02/25/24   Haze Lonni PARAS, MD  HYDROcodone -acetaminophen  (NORCO/VICODIN) 5-325 MG tablet Take 1 tablet by mouth every 4 (four) hours as needed. 04/05/24   Logan Ubaldo NOVAK, PA-C  latanoprost  (XALATAN ) 0.005 % ophthalmic solution Place 1 drop into both eyes at bedtime. Patient not taking: Reported on 03/15/2024 12/19/22   [provider]  losartan  (COZAAR ) 25 MG tablet Take 1 tablet (25 mg total) by mouth daily. 03/15/24   Thukkani, Arun K, MD  metoprolol  succinate (TOPROL -XL) 25 MG 24 hr tablet Take 0.5 tablets (12.5 mg total) by mouth daily. Patient not taking: Reported on 03/15/2024 10/04/23   Thukkani, Arun K, MD  oxyCODONE  (ROXICODONE ) 5 MG immediate release tablet Take 1 tablet (5 mg total) by  mouth every 4 (four) hours as needed for severe pain. Patient not taking: Reported on 03/15/2024 04/07/23   Yolande Lamar BROCKS, MD    Allergies: Patient has no known allergies.    Review of Systems  Genitourinary:  Positive for dysuria and flank pain.    Updated Vital Signs BP (!) 167/157   Pulse 96   Temp 97.7 F (36.5 C)   Resp 11   Ht 6' 4 (1.93 m)   Wt 133 kg   SpO2 100%   BMI 35.69 kg/m   Physical Exam Vitals and nursing note reviewed.  Constitutional:      Appearance: He is well-developed.  HENT:     Head:  Normocephalic and atraumatic.  Cardiovascular:     Rate and Rhythm: Normal rate.     Comments: Bradycardic intermittently to the 30's. Irregular. Then will rebound to 70's.  Pulmonary:     Effort: Pulmonary effort is normal. No respiratory distress.  Abdominal:     General: There is no distension.  Musculoskeletal:        General: Normal range of motion.     Cervical back: Normal range of motion.  Neurological:     Mental Status: He is alert.     (all labs ordered are listed, but only abnormal results are displayed) Labs Reviewed  URINALYSIS, ROUTINE W REFLEX MICROSCOPIC - Abnormal; Notable for the following components:      Result Value   Glucose, UA 150 (*)    All other components within normal limits  CBC - Abnormal; Notable for the following components:   RDW 16.0 (*)    All other components within normal limits  I-STAT CHEM 8, ED - Abnormal; Notable for the following components:   BUN 27 (*)    Calcium , Ion 1.13 (*)    All other components within normal limits  BASIC METABOLIC PANEL WITH GFR  HEPATIC FUNCTION PANEL  LIPASE, BLOOD    EKG: EKG Interpretation Date/Time:  Friday April 07 2024 04:27:01 EDT Ventricular Rate:  64 PR Interval:    QRS Duration:  126 QT Interval:  428 QTC Calculation: 441 R Axis:   -27  Text Interpretation: Atrial fibrillation Left ventricular hypertrophy with QRS widening ( R in aVL , Cornell product ) Cannot rule out Septal infarct , age undetermined Abnormal ECG When compared with ECG of 25-Feb-2024 03:25, PREVIOUS ECG IS PRESENT Confirmed by Lorette Mayo [phone]) on 04/07/2024 6:02:36 AM  Radiology: No results found.   Procedures   Medications Ordered in the ED  fentaNYL  (SUBLIMAZE ) injection 50 mcg (50 mcg Intravenous Given 04/07/24 9374)                                    Medical Decision Making Amount and/or Complexity of Data Reviewed Labs: ordered. Radiology: ordered.  Risk Prescription drug  management.   Continue to monitor HR but seems asymptomatic, may need obs to ensure not worsening.  Ct abdomen pelvis to rule out stone, kidney disease, etc. Pain meds. Care transferred pending results, reevlauation and ultimate disposition      Castin Donaghue, Mayo, MD 04/14/24 [phone]

## 2024-04-07 NOTE — ED Provider Notes (Signed)
  Physical Exam  BP (!) 167/103 (BP Location: Right Arm)   Pulse 67   Temp 97.6 F (36.4 C) (Oral)   Resp 16   Ht 6' 4 (1.93 m)   Wt 133 kg   SpO2 100%   BMI 35.69 kg/m   Physical Exam  Procedures  Procedures  ED Course / MDM    Medical Decision Making Amount and/or Complexity of Data Reviewed Labs: ordered. Radiology: ordered.  Risk Prescription drug management.   Riley Jefferson, assumed care for this patient.  In brief 63 year old male with intermittent flank pain.  Patient was signed out pending CT imaging.  Patient had also had a few episodes of his heart rate being reduced.  Patient CT imaging negative.  He currently states that he feels great.  Ambulated the patient in the emergency room will monitor.  Heart rate did not drop.  Advised patient to follow-up with his primary care doctor to discuss his metoprolol  dosing.  Will discharge.       [name], DO 04/07/24 0830

## 2024-04-07 NOTE — ED Notes (Signed)
 Introduced self to patient at this time. Patient is resting in bed with visible chest rise and fall. The call light is in reach. There are no further requests at this time.

## 2024-04-13 ENCOUNTER — Other Ambulatory Visit: Payer: Self-pay

## 2024-04-13 ENCOUNTER — Emergency Department (HOSPITAL_COMMUNITY)
Admission: EM | Admit: 2024-04-13 | Discharge: 2024-04-13 | Disposition: A | Discharge status: Home / Self Care | Attending: Emergency Medicine | Admitting: Emergency Medicine

## 2024-04-13 DIAGNOSIS — M545 Low back pain, unspecified: Secondary | ICD-10-CM | POA: Insufficient documentation

## 2024-04-13 DIAGNOSIS — Z7901 Long term (current) use of anticoagulants: Secondary | ICD-10-CM | POA: Diagnosis not present

## 2024-04-13 LAB — URINALYSIS, ROUTINE W REFLEX MICROSCOPIC
Bilirubin Urine: NEGATIVE
Glucose, UA: NEGATIVE mg/dL
Hgb urine dipstick: NEGATIVE
Ketones, ur: NEGATIVE mg/dL
Leukocytes,Ua: NEGATIVE
Nitrite: NEGATIVE
Protein, ur: NEGATIVE mg/dL
Specific Gravity, Urine: 1.024 (ref 1.005–1.030)
pH: 5 (ref 5.0–8.0)

## 2024-04-13 MED ORDER — LIDOCAINE 5 % EX PTCH: 1.0000 | MEDICATED_PATCH | CUTANEOUS | 0 refills | Status: DC

## 2024-04-13 MED ORDER — HYDROCODONE-ACETAMINOPHEN 5-325 MG PO TABS: 1.0000 | ORAL_TABLET | Freq: Four times a day (QID) | ORAL | 0 refills | Status: DC | PRN

## 2024-04-13 MED ORDER — METHOCARBAMOL 500 MG PO TABS: 500.0000 mg | ORAL_TABLET | Freq: Three times a day (TID) | ORAL | 0 refills | Status: DC | PRN

## 2024-04-13 NOTE — Discharge Instructions (Signed)
 Please contact your primary care doctor for close follow-up.  Return to the emergency department if any worsening or concerning symptoms

## 2024-04-13 NOTE — ED Triage Notes (Signed)
 Pt. Stated, Ive had left side back pain. Pt pointed to the left flank area. Its a constant pain
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
## 2024-04-29 ENCOUNTER — Other Ambulatory Visit: Payer: Self-pay

## 2024-04-29 ENCOUNTER — Emergency Department (HOSPITAL_COMMUNITY)
Admission: EM | Admit: 2024-04-29 | Discharge: 2024-04-29 | Disposition: A | Discharge status: Home / Self Care | Attending: Emergency Medicine | Admitting: Emergency Medicine

## 2024-04-29 ENCOUNTER — Encounter (HOSPITAL_COMMUNITY): Payer: Self-pay | Admitting: Emergency Medicine

## 2024-04-29 ENCOUNTER — Emergency Department (HOSPITAL_COMMUNITY)

## 2024-04-29 DIAGNOSIS — K5900 Constipation, unspecified: Secondary | ICD-10-CM | POA: Insufficient documentation

## 2024-04-29 DIAGNOSIS — I6523 Occlusion and stenosis of bilateral carotid arteries: Secondary | ICD-10-CM | POA: Diagnosis not present

## 2024-04-29 DIAGNOSIS — R0789 Other chest pain: Secondary | ICD-10-CM | POA: Diagnosis not present

## 2024-04-29 DIAGNOSIS — R079 Chest pain, unspecified: Secondary | ICD-10-CM

## 2024-04-29 DIAGNOSIS — K5730 Diverticulosis of large intestine without perforation or abscess without bleeding: Secondary | ICD-10-CM | POA: Diagnosis not present

## 2024-04-29 DIAGNOSIS — M542 Cervicalgia: Secondary | ICD-10-CM | POA: Insufficient documentation

## 2024-04-29 DIAGNOSIS — R001 Bradycardia, unspecified: Secondary | ICD-10-CM | POA: Diagnosis not present

## 2024-04-29 DIAGNOSIS — I672 Cerebral atherosclerosis: Secondary | ICD-10-CM | POA: Diagnosis not present

## 2024-04-29 DIAGNOSIS — I700 Atherosclerosis of aorta: Secondary | ICD-10-CM | POA: Diagnosis not present

## 2024-04-29 DIAGNOSIS — R0602 Shortness of breath: Secondary | ICD-10-CM | POA: Diagnosis not present

## 2024-04-29 DIAGNOSIS — R918 Other nonspecific abnormal finding of lung field: Secondary | ICD-10-CM | POA: Diagnosis not present

## 2024-04-29 DIAGNOSIS — K760 Fatty (change of) liver, not elsewhere classified: Secondary | ICD-10-CM | POA: Diagnosis not present

## 2024-04-29 LAB — CBC
HCT: 46 % (ref 39.0–52.0)
Hemoglobin: 15 g/dL (ref 13.0–17.0)
MCH: 27.3 pg (ref 26.0–34.0)
MCHC: 32.6 g/dL (ref 30.0–36.0)
MCV: 83.8 fL (ref 80.0–100.0)
Platelets: 232 K/uL (ref 150–400)
RBC: 5.49 MIL/uL (ref 4.22–5.81)
RDW: 15.7 % — ABNORMAL HIGH (ref 11.5–15.5)
WBC: 7.2 K/uL (ref 4.0–10.5)
nRBC: 0 % (ref 0.0–0.2)

## 2024-04-29 LAB — TROPONIN I (HIGH SENSITIVITY)
Troponin I (High Sensitivity): 11 ng/L (ref <18)
Troponin I (High Sensitivity): 13 ng/L (ref <18)

## 2024-04-29 LAB — BASIC METABOLIC PANEL WITH GFR
Anion gap: 12 (ref 5–15)
BUN: 14 mg/dL (ref 8–23)
CO2: 20 mmol/L — ABNORMAL LOW (ref 22–32)
Calcium: 9 mg/dL (ref 8.9–10.3)
Chloride: 105 mmol/L (ref 98–111)
Creatinine, Ser: 1.04 mg/dL (ref 0.61–1.24)
GFR, Estimated: >60 mL/min (ref >60)
Glucose, Bld: 127 mg/dL — ABNORMAL HIGH (ref 70–99)
Potassium: 3.8 mmol/L (ref 3.5–5.1)
Sodium: 137 mmol/L (ref 135–145)

## 2024-04-29 LAB — RESP PANEL BY RT-PCR (RSV, FLU A&B, COVID)  RVPGX2
Influenza A by PCR: NEGATIVE
Influenza B by PCR: NEGATIVE
Resp Syncytial Virus by PCR: NEGATIVE
SARS Coronavirus 2 by RT PCR: NEGATIVE

## 2024-04-29 MED ORDER — ACETAMINOPHEN 500 MG PO TABS
1000.0000 mg | ORAL_TABLET | Freq: Once | ORAL | Status: AC
  Administered 2024-04-29: 1000 mg via ORAL
  Filled 2024-04-29: qty 2

## 2024-04-29 MED ORDER — LIDOCAINE 5 % EX PTCH: 1.0000 | MEDICATED_PATCH | CUTANEOUS | 0 refills | Status: DC

## 2024-04-29 MED ORDER — METOCLOPRAMIDE HCL 5 MG/ML IJ SOLN
10.0000 mg | Freq: Once | INTRAMUSCULAR | Status: AC
  Administered 2024-04-29: 10 mg via INTRAVENOUS
  Filled 2024-04-29: qty 2

## 2024-04-29 MED ORDER — IOHEXOL 350 MG/ML SOLN
150.0000 mL | Freq: Once | INTRAVENOUS | Status: AC | PRN
  Administered 2024-04-29: 150 mL via INTRAVENOUS

## 2024-04-29 MED ORDER — DIPHENHYDRAMINE HCL 50 MG/ML IJ SOLN
12.5000 mg | Freq: Once | INTRAMUSCULAR | Status: AC
  Administered 2024-04-29: 12.5 mg via INTRAVENOUS
  Filled 2024-04-29: qty 1

## 2024-04-29 MED ORDER — CYCLOBENZAPRINE HCL 10 MG PO TABS
10.0000 mg | ORAL_TABLET | Freq: Two times a day (BID) | ORAL | 0 refills | Status: DC | PRN
Start: 2024-04-29 — End: 2024-06-07

## 2024-04-29 NOTE — Discharge Instructions (Addendum)
 Evaluation for your chest pain and neck pain was reassuring.  You were noted to be persistently in atrial fibrillation here.  Please follow-up with cardiology.  Follow-up with PCP for your neck pain.  I sent Lidoderm  patches for your neck pain and Flexeril which is a muscle relaxer to your pharmacy.  If your chest pain returns, you have visual disturbance, increased neck pain or any other concerning symptom please return to the ED for further evaluation.

## 2024-04-29 NOTE — ED Provider Notes (Signed)
 Patient signed out to me by PA Lamar Schlossman.  Here for left-sided anterior neck pain with reduced range of motion.  CT angio of the neck chest abdomen pelvis is pending.  Plan is to discharge with Lidoderm  patches and Flexeril and PCP follow-up if CT is unremarkable. Physical Exam  BP (!) 142/93   Pulse 65   Temp (!) 97.3 F (36.3 C) (Oral)   Resp 12   Wt 133 kg   SpO2 98%   BMI 35.69 kg/m   Physical Exam  Procedures  Procedures  ED Course / MDM   Clinical Course as of 04/29/24 1052  Sat Apr 29, 2024  0631 Left sided anterior neck pain. Pain goes into chest. CT angio ordered. DC if ok. Can go home with lidoderm  and flexeril. [JR]    Clinical Course User Index [JR] Lang Norleen POUR, PA-C   Medical Decision Making Amount and/or Complexity of Data Reviewed Labs: ordered. Radiology: ordered.  Risk OTC drugs. Prescription drug management.   CT angio was nonacute.  On reassessment patient stated that he did have persistence of his neck pain and felt like it was radiating into the back of his head.  Denied nuchal rigidity or visual disturbance.  Treated with a headache cocktail and he stated he felt much better.  Noted to be persistently in A-fib but heart rate normal.  On serial reassessments still without chest pain shortness of breath or palpitations.  Advised PCP follow-up but also cardiology follow-up as well given persistence of his A-fib.  Also sent Lidoderm  patches and Flexeril to his pharmacy.  Discussed return precautions.  Discharged.  Discharge Instructions     Ambulatory referral to Cardiology   Complete by: As directed    If you have not heard from the Cardiology office within the next 72 hours please call [phone].         Lang Norleen POUR, PA-C 04/29/24 1052    Cottie Donnice PARAS, MD 04/29/24 1058

## 2024-04-29 NOTE — ED Notes (Signed)
 Patient transported to X-ray

## 2024-04-29 NOTE — ED Provider Notes (Signed)
 [facility] Emergency Department Provider Note MRN:  [id number]  Arrival date & time: 04/29/24     Chief Complaint   Neck Pain and Shortness of Breath   History of Present Illness   Riley Jefferson is a 63 y.o. year-old male presents to the ED with chief complaint of left-sided neck pain.  Reports associated sore throat and shortness of breath.  States that his symptoms worsen with palpation and with movement of the neck.  He denies fevers, chills, or productive cough.  He states that he does have tightness and pain in the chest.  Denies ever having had symptoms like this before.  Denies any successful treatments prior to arrival.  Denies numbness, weakness, or tingling.  History provided by patient.   Review of Systems  Pertinent positive and negative review of systems noted in HPI.    Physical Exam   Vitals:   04/29/24 0028 04/29/24 0502  BP: (!) 180/115 (!) 150/89  Pulse: (!) 51 69  Resp: 20 19  Temp: 98.5 F (36.9 C) (!) 97.5 F (36.4 C)  SpO2: 97% 98%    CONSTITUTIONAL:  non toxic-appearing, NAD NEURO:  Alert and oriented x 3, CN 3-12 grossly intact EYES:  eyes equal and reactive ENT/NECK:  Supple, no stridor, TTP of the left anterior neck, no adenopathy noted, no redness, normal oropharynx CARDIO:  mild bradycardia, regular rhythm, appears well-perfused  PULM:  No respiratory distress, CTAB GI/GU:  non-distended,  MSK/SPINE:  No gross deformities, no edema, moves all extremities  SKIN:  no rash, atraumatic   *Additional and/or pertinent findings included in MDM below  Diagnostic and Interventional Summary    EKG Interpretation Date/Time:  Saturday April 29 2024 00:32:24 EDT Ventricular Rate:  88 PR Interval:    QRS Duration:  126 QT Interval:  380 QTC Calculation: 459 R Axis:   -2  Text Interpretation: Atrial fibrillation Left ventricular hypertrophy with QRS widening ( R in aVL , Cornell product ) Cannot rule out Septal infarct  , age undetermined T wave abnormality, consider inferior ischemia Abnormal ECG When compared with ECG of 07-Apr-2024 04:27, No significant change since last tracing Confirmed by Haze Lonni PARAS [phone]) on 04/29/2024 12:38:35 AM       Labs Reviewed  BASIC METABOLIC PANEL WITH GFR - Abnormal; Notable for the following components:      Result Value   CO2 20 (*)    Glucose, Bld 127 (*)    All other components within normal limits  CBC - Abnormal; Notable for the following components:   RDW 15.7 (*)    All other components within normal limits  RESP PANEL BY RT-PCR (RSV, FLU A&B, COVID)  RVPGX2  TROPONIN I (HIGH SENSITIVITY)  TROPONIN I (HIGH SENSITIVITY)    DG Chest 2 View  Final Result    CT Angio Neck W and/or Wo Contrast    (Results Pending)  CT Angio Chest/Abd/Pel for Dissection W and/or Wo Contrast    (Results Pending)    Medications - No data to display   Procedures  /  Critical Care Procedures  ED Course and Medical Decision Making  I have reviewed the triage vital signs, the nursing notes, and pertinent available records from the EMR.  Social Determinants Affecting Complexity of Care: Patient has no clinically significant social determinants affecting this chief complaint..   ED Course: Clinical Course as of 04/29/24 0633  Sat Apr 29, 2024  0631 Left sided anterior neck pain. Pain goes into chest.  CT angio ordered. DC if ok. Can go home with lidoderm  and flexeril. [JR]    Clinical Course User Index [JR] Lang Norleen POUR, PA-C    Medical Decision Making Patient here with neck pain, chest pain, and shortness of breath.  His symptoms have been ongoing since yesterday morning.  Denies any known trauma.  He is noted to be quite hypertensive initially.  He does have some submandibular tenderness and anterior neck tenderness.  He states that the pain radiates down into his chest and causes him to have some shortness of breath.  DDx includes torticollis, pharyngitis,  COVID, ACS, dissection.  Case discussed with Dr. Carita.  Will get imaging to r/o dissection.  Anticipate discharge home if negative.    Amount and/or Complexity of Data Reviewed Labs: ordered. Radiology: ordered.         Consultants:    Treatment and Plan: Patient signed out to oncoming team, who will continue care.      Final Clinical Impressions(s) / ED Diagnoses  No diagnosis found.  ED Discharge Orders     None         Discharge Instructions Discussed with and Provided to Patient:   Discharge Instructions   None      Vicky Charleston, PA-C 04/29/24 9366    Carita Senior, MD 04/30/24 [phone]

## 2024-04-29 NOTE — ED Triage Notes (Signed)
 Pt in with sharp L posterior neck pain, onset since waking yesterday morning. Pt also states he was having sob while trying to sleep tonight, which mainly prompted tonight's visit. Reports some chest tightness as well. Neck pain 10/10

## 2024-05-01 ENCOUNTER — Emergency Department (HOSPITAL_COMMUNITY)
Admission: EM | Admit: 2024-05-01 | Discharge: 2024-05-01 | Disposition: A | Discharge status: Home / Self Care | Attending: Emergency Medicine | Admitting: Emergency Medicine

## 2024-05-01 ENCOUNTER — Emergency Department (HOSPITAL_COMMUNITY)

## 2024-05-01 ENCOUNTER — Encounter (HOSPITAL_COMMUNITY): Payer: Self-pay

## 2024-05-01 ENCOUNTER — Other Ambulatory Visit: Payer: Self-pay

## 2024-05-01 DIAGNOSIS — R0789 Other chest pain: Secondary | ICD-10-CM | POA: Insufficient documentation

## 2024-05-01 DIAGNOSIS — R911 Solitary pulmonary nodule: Secondary | ICD-10-CM

## 2024-05-01 DIAGNOSIS — I517 Cardiomegaly: Secondary | ICD-10-CM | POA: Diagnosis not present

## 2024-05-01 DIAGNOSIS — Z7901 Long term (current) use of anticoagulants: Secondary | ICD-10-CM | POA: Insufficient documentation

## 2024-05-01 DIAGNOSIS — R0602 Shortness of breath: Secondary | ICD-10-CM | POA: Insufficient documentation

## 2024-05-01 MED ORDER — OXYCODONE-ACETAMINOPHEN 5-325 MG PO TABS
1.0000 | ORAL_TABLET | Freq: Once | ORAL | Status: AC
  Administered 2024-05-01: 1 via ORAL
  Filled 2024-05-01: qty 1

## 2024-05-01 NOTE — ED Provider Notes (Signed)
 [place] EMERGENCY DEPARTMENT AT [facility] Provider Note   CSN: [id number] Arrival date & time: 05/01/24  1449     Patient presents with: Shortness of Breath   Riley Jefferson is a 63 y.o. male.    Shortness of Breath Patient presents with shortness of breath neck pain chest pain feeling weak.  Has been seen recently for the same including 2 days ago.  Had extensive workup at time.  States now more short of breath.  Is on Eliquis  for his A-fib.  No swelling in his legs.  No cough.     Prior to Admission medications   Medication Sig Start Date End Date Taking? Authorizing Provider  apixaban  (ELIQUIS ) 5 MG TABS tablet Take 1 tablet (5 mg total) by mouth 2 (two) times daily. 03/15/24   Thukkani, Arun K, MD  cyclobenzaprine (FLEXERIL) 10 MG tablet Take 1 tablet (10 mg total) by mouth 2 (two) times daily as needed for muscle spasms. 04/29/24   Robinson, John K, PA-C  empagliflozin  (JARDIANCE ) 10 MG TABS tablet Take 1 tablet (10 mg total) by mouth daily. 03/15/24   Thukkani, Arun K, MD  furosemide  (LASIX ) 40 MG tablet Take 1 tablet (40 mg total) by mouth 2 (two) times daily. 03/15/24   Thukkani, Arun K, MD  HYDROcodone -acetaminophen  (NORCO/VICODIN) 5-325 MG tablet Take 1 tablet by mouth every 6 (six) hours as needed. 04/13/24   Towana Ozell BROCKS, MD  latanoprost  (XALATAN ) 0.005 % ophthalmic solution Place 1 drop into both eyes at bedtime. 12/19/22   [provider]  lidocaine  (LIDODERM ) 5 % Place 1 patch onto the skin daily. Remove & Discard patch within 12 hours or as directed by MD 04/29/24   Lang Norleen POUR, PA-C  losartan  (COZAAR ) 25 MG tablet Take 1 tablet (25 mg total) by mouth daily. Patient not taking: Reported on 04/07/2024 03/15/24   Thukkani, Arun K, MD  methocarbamol  (ROBAXIN ) 500 MG tablet Take 1 tablet (500 mg total) by mouth every 8 (eight) hours as needed for muscle spasms. 04/13/24   Towana Ozell BROCKS, MD  metoprolol  succinate (TOPROL -XL) 25 MG 24 hr tablet  Take 0.5 tablets (12.5 mg total) by mouth daily. Patient taking differently: Take 25 mg by mouth daily. 10/04/23   Thukkani, Arun K, MD    Allergies: Patient has no known allergies.    Review of Systems  Respiratory:  Positive for shortness of breath.     Updated Vital Signs BP (!) 152/85   Pulse 86   Temp 97.8 F (36.6 C)   Resp (!) 21   Ht 6' 4 (1.93 m)   Wt 133.8 kg   SpO2 100%   BMI 35.91 kg/m   Physical Exam Vitals and nursing note reviewed.  Pulmonary:     Breath sounds: No wheezing or rhonchi.  Chest:     Chest wall: Tenderness present.     Comments: Some tenderness to left upper chest wall. Musculoskeletal:     Right lower leg: Edema present.     Left lower leg: Edema present.  Skin:    General: Skin is warm.  Neurological:     Mental Status: He is alert and oriented to person, place, and time.     (all labs ordered are listed, but only abnormal results are displayed) Labs Reviewed - No data to display  EKG: None  Radiology: No results found.   Procedures   Medications Ordered in the ED  oxyCODONE -acetaminophen  (PERCOCET/ROXICET) 5-325 MG per tablet 1 tablet (1  tablet Oral Given 05/01/24 1739)                                    Medical Decision Making Amount and/or Complexity of Data Reviewed Radiology: ordered.  Risk Prescription drug management.   Patient with left-sided chest pain.  Some shortness of breath.  Recently seen for same.  Had recent CT scan.  Reviewed notes.  Already on anticoagulation.  Not hypoxic now.  Does have lung nodules which will need follow-up.  Showed up on CT scan before but patient said he was not informed of it.  Does not appear to need admission to the hospital.  Will discharge home. I reviewed recent ER visits.      Final diagnoses:  None    ED Discharge Orders     None          Patsey Lot, MD 05/01/24 2229

## 2024-05-01 NOTE — ED Triage Notes (Signed)
 Patient here with complaints of shortness of breath since this morning, He states he is also weak. He denies cold sx and chest pain. He does report back and neck pain.

## 2024-05-01 NOTE — Discharge Instructions (Signed)
 Your workup did not show some small lung nodules.  Follow-up with your primary care doctor.  He will likely need another CAT scan in about 3 months.

## 2024-05-01 NOTE — ED Notes (Signed)
 ED Provider at bedside.

## 2024-05-01 NOTE — ED Triage Notes (Signed)
 Patient is on furosemide , reports orthopnea for the past week. Patient was tripoding on RN arrival to room, but appears relaxed and in no distress at this time.

## 2024-05-03 ENCOUNTER — Ambulatory Visit
Admission: EM | Admit: 2024-05-03 | Discharge: 2024-05-03 | Disposition: A | Discharge status: Home / Self Care | Attending: Family Medicine | Admitting: Family Medicine

## 2024-05-03 DIAGNOSIS — R81 Glycosuria: Secondary | ICD-10-CM | POA: Diagnosis not present

## 2024-05-03 DIAGNOSIS — R350 Frequency of micturition: Secondary | ICD-10-CM | POA: Insufficient documentation

## 2024-05-03 DIAGNOSIS — F17201 Nicotine dependence, unspecified, in remission: Secondary | ICD-10-CM | POA: Insufficient documentation

## 2024-05-03 DIAGNOSIS — R300 Dysuria: Secondary | ICD-10-CM | POA: Insufficient documentation

## 2024-05-03 DIAGNOSIS — I10 Essential (primary) hypertension: Secondary | ICD-10-CM | POA: Insufficient documentation

## 2024-05-03 LAB — POCT URINE DIPSTICK
Bilirubin, UA: NEGATIVE
Glucose, UA: >=1000 mg/dL — AB
Ketones, POC UA: NEGATIVE mg/dL
Leukocytes, UA: NEGATIVE
Nitrite, UA: NEGATIVE
POC PROTEIN,UA: 30 — AB
Spec Grav, UA: >=1.03 — AB (ref 1.010–1.025)
Urobilinogen, UA: 0.2 U/dL
pH, UA: 5.5 (ref 5.0–8.0)

## 2024-05-03 LAB — GLUCOSE, POCT (MANUAL RESULT ENTRY): POC Glucose: 122 mg/dL — AB (ref 70–99)

## 2024-05-03 NOTE — ED Provider Notes (Signed)
 [facility]    CSN: [id number] Arrival date & time: 05/03/24  1813      History   Chief Complaint No chief complaint on file.   HPI Riley Jefferson is a 63 y.o. male presents for dysuria.  Patient reports urinary frequency with penile itching as well as malodorous urine.  States has been going on for a few days.  Denies burning with urination, testicular pain or swelling, penile discharge, rashes.  He states he is not sexually active and no concerns for STDs.  He denies history of diabetes but does take Jardiance .  Reports remote history of UTIs in the past.  He does take Lasix .  He states he has been drinking a lot of herbal tea at least 3 times a day as well as some vinegar.  No other concerns at this time.  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   Ear stuffiness, right 02/21/2024   Sensorineural hearing loss (SNHL) of both ears 02/21/2024   Epistaxis 12/28/2022   Bleeding from the nose 12/28/2022   Hypokalemia 06/09/2022   Metabolic acidosis 06/09/2022   Hypocalcemia 06/09/2022   Hypoglycemia 06/09/2022   Atrial fibrillation with slow ventricular response (HCC) 06/09/2022   Symptomatic bradycardia 06/09/2022   Normocytic anemia 06/09/2022   Thrombocytopenia 06/09/2022   Hypertension 06/09/2022   At risk for medication noncompliance 11/19/2019   Dizziness 11/19/2019   Fall on same level from stumbling 11/19/2019   Impingement syndrome of left shoulder 01/02/2015   Primary localized osteoarthrosis of shoulder region 03/18/2014   Shoulder joint pain 09/18/2013   Enthesopathy of elbow 03/13/2013   Chest wall pain 06/05/2011   Chronic atrial fibrillation (HCC) 06/05/2011   Malignant hypertension 06/05/2011   Permanent atrial fibrillation (HCC) 10/13/2006    History  reviewed. No pertinent surgical history.     Home Medications    Prior to Admission medications   Medication Sig Start Date End Date Taking? Authorizing Provider  apixaban  (ELIQUIS ) 5 MG TABS tablet Take 1 tablet (5 mg total) by mouth 2 (two) times daily. 03/15/24   Thukkani, Arun K, MD  cyclobenzaprine (FLEXERIL) 10 MG tablet Take 1 tablet (10 mg total) by mouth 2 (two) times daily as needed for muscle spasms. 04/29/24   Robinson, John K, PA-C  empagliflozin  (JARDIANCE ) 10 MG TABS tablet Take 1 tablet (10 mg total) by mouth daily. 03/15/24   Thukkani, Arun K, MD  furosemide  (LASIX ) 40 MG tablet Take 1 tablet (40 mg total) by mouth 2 (two) times daily. 03/15/24   Thukkani, Arun K, MD  HYDROcodone -acetaminophen  (NORCO/VICODIN) 5-325 MG tablet Take 1 tablet by mouth every 6 (six) hours as needed. 04/13/24   Towana Ozell BROCKS, MD  latanoprost  (XALATAN ) 0.005 % ophthalmic solution Place 1 drop into both eyes at bedtime. 12/19/22   [provider]  lidocaine  (LIDODERM ) 5 % Place 1 patch onto the skin daily. Remove & Discard patch within 12 hours or as directed by MD 04/29/24   Lang Norleen POUR, PA-C  losartan  (COZAAR ) 25 MG tablet Take 1 tablet (25 mg total) by mouth daily. Patient not taking: Reported on 04/07/2024 03/15/24   Thukkani, Arun K, MD  methocarbamol  (ROBAXIN ) 500 MG tablet Take 1 tablet (500 mg total) by mouth every 8 (eight) hours as needed for muscle  spasms. 04/13/24   Towana Ozell BROCKS, MD  metoprolol  succinate (TOPROL -XL) 25 MG 24 hr tablet Take 0.5 tablets (12.5 mg total) by mouth daily. Patient taking differently: Take 25 mg by mouth daily. 10/04/23   Thukkani, Arun K, MD    Family History Family History  Problem Relation Age of Onset   Stroke Father     Social History Social History   Tobacco Use   Smoking status: Former    Current packs/day: 0.00    Average packs/day: 1 pack/day for 20.0 years (20.0 ttl pk-yrs)    Types: Cigarettes    Start date: 09/21/1981    Quit  date: 09/21/2001    Years since quitting: 22.6   Smokeless tobacco: Never  Vaping Use   Vaping status: Never Used  Substance Use Topics   Alcohol use: Never   Drug use: Never     Allergies   Patient has no known allergies.   Review of Systems Review of Systems  Genitourinary:  Positive for frequency.     Physical Exam Triage Vital Signs ED Triage Vitals [05/03/24 1833]  Encounter Vitals Group     BP (!) 141/93     Girls Systolic BP Percentile      Girls Diastolic BP Percentile      Boys Systolic BP Percentile      Boys Diastolic BP Percentile      Pulse Rate 70     Resp 15     Temp 98.9 F (37.2 C)     Temp Source Oral     SpO2 96 %     Weight      Height      Head Circumference      Peak Flow      Pain Score 0     Pain Loc      Pain Education      Exclude from Growth Chart    No data found.  Updated Vital Signs BP (!) 141/93 (BP Location: Right Arm)   Pulse 70   Temp 98.9 F (37.2 C) (Oral)   Resp 15   SpO2 96%   Visual Acuity Right Eye Distance:   Left Eye Distance:   Bilateral Distance:    Right Eye Near:   Left Eye Near:    Bilateral Near:     Physical Exam Vitals and nursing note reviewed.  Constitutional:      Appearance: Normal appearance.  HENT:     Head: Normocephalic and atraumatic.  Eyes:     Pupils: Pupils are equal, round, and reactive to light.  Cardiovascular:     Rate and Rhythm: Normal rate.  Pulmonary:     Effort: Pulmonary effort is normal.  Abdominal:     Tenderness: There is no right CVA tenderness or left CVA tenderness.  Skin:    General: Skin is warm and dry.  Neurological:     General: No focal deficit present.     Mental Status: He is alert and oriented to person, place, and time.  Psychiatric:        Mood and Affect: Mood normal.        Behavior: Behavior normal.      UC Treatments / Results  Labs (all labs ordered are listed, but only abnormal results are displayed) Labs Reviewed  POCT URINE  DIPSTICK - Abnormal; Notable for the following components:      Result Value   Glucose, UA >=1,000 (*)    Spec Grav, UA >=1.030 (*)  Blood, UA trace-intact (*)    POC PROTEIN,UA =30 (*)    All other components within normal limits  GLUCOSE, POCT (MANUAL RESULT ENTRY) - Abnormal; Notable for the following components:   POC Glucose 122 (*)    All other components within normal limits  URINE CULTURE    EKG   Radiology No results found.  Procedures Procedures (including critical care time)  Medications Ordered in UC Medications - No data to display  Initial Impression / Assessment and Plan / UC Course  I have reviewed the triage vital signs and the nursing notes.  Pertinent labs & imaging results that were available during my care of the patient were reviewed by me and considered in my medical decision making (see chart for details).     Reviewed exam and symptoms with patient.  Urine without signs of UTI but does show greater than 1000 glucose.  Fingerstick 122 nonfasting.  I did look back at previous UAs he has had done recently and he had no glucose in the urine.  He also had an A1c on August 20 and it was 6.0.  Advised him to follow-up with his PCP regarding this.  Also advise he cut back on his herbal tea and vinegar as this may be contributing to his symptoms.  Patient to follow-up with PCP in 2 to 3 days for repeat check.  ER precautions reviewed. Final Clinical Impressions(s) / UC Diagnoses   Final diagnoses:  Dysuria  Glucose found in urine on examination  Urinary frequency     Discharge Instructions      The clinic will contact you with results of the urine culture done today if positive.  Please follow-up with your PCP in 2 to 3 days for recheck.  Please go to the ER if you develop any worsening symptoms.  Hope you feel better soon!     ED Prescriptions   None    PDMP not reviewed this encounter.   Loreda Myla SAUNDERS, NP 05/03/24 1909

## 2024-05-03 NOTE — Discharge Instructions (Addendum)
 The clinic will contact you with results of the urine culture done today if positive.  Please follow-up with your PCP in 2 to 3 days for recheck.  Please go to the ER if you develop any worsening symptoms.  Hope you feel better soon!

## 2024-05-03 NOTE — ED Triage Notes (Signed)
 Pt present with concerns of possible UTI. States he has been going more frequent and has penile itching. States he feels he has Gonorrhea but he does not do sex. Pt c/o dysuria. States it has been going on for two weeks.

## 2024-05-05 ENCOUNTER — Ambulatory Visit (HOSPITAL_COMMUNITY): Payer: Self-pay

## 2024-05-05 LAB — URINE CULTURE: Culture: 50000 — AB

## 2024-05-25 ENCOUNTER — Telehealth: Payer: Self-pay

## 2024-05-25 NOTE — Telephone Encounter (Signed)
 We are listed as PCP on patients insurance.  LMOM for patient to call and schedule or let us  know if he has other PCP.

## 2024-06-06 ENCOUNTER — Observation Stay (HOSPITAL_COMMUNITY)

## 2024-06-06 ENCOUNTER — Emergency Department (HOSPITAL_COMMUNITY)

## 2024-06-06 ENCOUNTER — Encounter (HOSPITAL_COMMUNITY): Payer: Self-pay

## 2024-06-06 ENCOUNTER — Other Ambulatory Visit: Payer: Self-pay

## 2024-06-06 ENCOUNTER — Observation Stay (HOSPITAL_COMMUNITY)
Admission: EM | Admit: 2024-06-06 | Discharge: 2024-06-07 | Disposition: A | Discharge status: Home / Self Care | Attending: Internal Medicine | Admitting: Internal Medicine

## 2024-06-06 DIAGNOSIS — I10 Essential (primary) hypertension: Secondary | ICD-10-CM | POA: Diagnosis not present

## 2024-06-06 DIAGNOSIS — M25511 Pain in right shoulder: Principal | ICD-10-CM | POA: Insufficient documentation

## 2024-06-06 DIAGNOSIS — D649 Anemia, unspecified: Secondary | ICD-10-CM | POA: Diagnosis present

## 2024-06-06 DIAGNOSIS — I4821 Permanent atrial fibrillation: Secondary | ICD-10-CM | POA: Diagnosis not present

## 2024-06-06 DIAGNOSIS — H5410 Blindness, one eye, low vision other eye, unspecified eyes: Secondary | ICD-10-CM | POA: Insufficient documentation

## 2024-06-06 DIAGNOSIS — R55 Syncope and collapse: Principal | ICD-10-CM | POA: Diagnosis present

## 2024-06-06 DIAGNOSIS — Z87891 Personal history of nicotine dependence: Secondary | ICD-10-CM | POA: Insufficient documentation

## 2024-06-06 DIAGNOSIS — R29818 Other symptoms and signs involving the nervous system: Secondary | ICD-10-CM

## 2024-06-06 DIAGNOSIS — H547 Unspecified visual loss: Secondary | ICD-10-CM

## 2024-06-06 LAB — I-STAT CHEM 8, ED
BUN: 17 mg/dL (ref 8–23)
Calcium, Ion: 1.25 mmol/L (ref 1.15–1.40)
Chloride: 106 mmol/L (ref 98–111)
Creatinine, Ser: 0.9 mg/dL (ref 0.61–1.24)
Glucose, Bld: 98 mg/dL (ref 70–99)
HCT: 51 % (ref 39.0–52.0)
Hemoglobin: 17.3 g/dL — ABNORMAL HIGH (ref 13.0–17.0)
Potassium: 4.3 mmol/L (ref 3.5–5.1)
Sodium: 143 mmol/L (ref 135–145)
TCO2: 26 mmol/L (ref 22–32)

## 2024-06-06 LAB — CBC WITH DIFFERENTIAL/PLATELET
Abs Immature Granulocytes: 0.01 K/uL (ref 0.00–0.07)
Basophils Absolute: 0 K/uL (ref 0.0–0.1)
Basophils Relative: 1 %
Eosinophils Absolute: 0 K/uL (ref 0.0–0.5)
Eosinophils Relative: 1 %
HCT: 48.3 % (ref 39.0–52.0)
Hemoglobin: 15.8 g/dL (ref 13.0–17.0)
Immature Granulocytes: 0 %
Lymphocytes Relative: 34 %
Lymphs Abs: 2 K/uL (ref 0.7–4.0)
MCH: 27.7 pg (ref 26.0–34.0)
MCHC: 32.7 g/dL (ref 30.0–36.0)
MCV: 84.6 fL (ref 80.0–100.0)
Monocytes Absolute: 0.6 K/uL (ref 0.1–1.0)
Monocytes Relative: 9 %
Neutro Abs: 3.4 K/uL (ref 1.7–7.7)
Neutrophils Relative %: 55 %
Platelets: 224 K/uL (ref 150–400)
RBC: 5.71 MIL/uL (ref 4.22–5.81)
RDW: 15.6 % — ABNORMAL HIGH (ref 11.5–15.5)
WBC: 6.1 K/uL (ref 4.0–10.5)
nRBC: 0 % (ref 0.0–0.2)

## 2024-06-06 LAB — TROPONIN I (HIGH SENSITIVITY)
Troponin I (High Sensitivity): 11 ng/L (ref <18)
Troponin I (High Sensitivity): 11 ng/L (ref <18)

## 2024-06-06 LAB — LIPASE, BLOOD: Lipase: 23 U/L (ref 11–51)

## 2024-06-06 LAB — COMPREHENSIVE METABOLIC PANEL WITH GFR
ALT: 21 U/L (ref 0–44)
AST: 26 U/L (ref 15–41)
Albumin: 3.6 g/dL (ref 3.5–5.0)
Alkaline Phosphatase: 76 U/L (ref 38–126)
Anion gap: 8 (ref 5–15)
BUN: 13 mg/dL (ref 8–23)
CO2: 24 mmol/L (ref 22–32)
Calcium: 8.5 mg/dL — ABNORMAL LOW (ref 8.9–10.3)
Chloride: 107 mmol/L (ref 98–111)
Creatinine, Ser: 0.99 mg/dL (ref 0.61–1.24)
GFR, Estimated: >60 mL/min (ref >60)
Glucose, Bld: 96 mg/dL (ref 70–99)
Potassium: 4.2 mmol/L (ref 3.5–5.1)
Sodium: 139 mmol/L (ref 135–145)
Total Bilirubin: 1 mg/dL (ref 0.0–1.2)
Total Protein: 7.8 g/dL (ref 6.5–8.1)

## 2024-06-06 LAB — URINALYSIS, ROUTINE W REFLEX MICROSCOPIC
Bilirubin Urine: NEGATIVE
Glucose, UA: NEGATIVE mg/dL
Hgb urine dipstick: NEGATIVE
Ketones, ur: NEGATIVE mg/dL
Leukocytes,Ua: NEGATIVE
Nitrite: NEGATIVE
Protein, ur: NEGATIVE mg/dL
Specific Gravity, Urine: 1.028 (ref 1.005–1.030)
pH: 6 (ref 5.0–8.0)

## 2024-06-06 LAB — ECHOCARDIOGRAM COMPLETE
Height: 76 in
S' Lateral: 3.5 cm
Weight: 4720 [oz_av]

## 2024-06-06 MED ORDER — POLYETHYLENE GLYCOL 3350 17 G PO PACK: 17.0000 g | PACK | Freq: Every day | ORAL | Status: DC | PRN

## 2024-06-06 MED ORDER — SODIUM CHLORIDE 0.9% FLUSH
3.0000 mL | Freq: Two times a day (BID) | INTRAVENOUS | Status: DC
  Administered 2024-06-06 – 2024-06-07 (×3): 3 mL via INTRAVENOUS

## 2024-06-06 MED ORDER — FENTANYL CITRATE (PF) 50 MCG/ML IJ SOSY
50.0000 ug | PREFILLED_SYRINGE | Freq: Once | INTRAMUSCULAR | Status: AC
  Administered 2024-06-06: 50 ug via INTRAVENOUS
  Filled 2024-06-06: qty 1

## 2024-06-06 MED ORDER — IOHEXOL 350 MG/ML SOLN
100.0000 mL | Freq: Once | INTRAVENOUS | Status: AC | PRN
  Administered 2024-06-06: 100 mL via INTRAVENOUS

## 2024-06-06 MED ORDER — LORAZEPAM 2 MG/ML IJ SOLN
1.0000 mg | Freq: Once | INTRAMUSCULAR | Status: AC | PRN
  Administered 2024-06-06: 1 mg via INTRAVENOUS
  Filled 2024-06-06: qty 1

## 2024-06-06 MED ORDER — LATANOPROST 0.005 % OP SOLN
1.0000 [drp] | Freq: Every day | OPHTHALMIC | Status: DC
  Administered 2024-06-06: 1 [drp] via OPHTHALMIC
  Filled 2024-06-06: qty 2.5

## 2024-06-06 MED ORDER — ACETAMINOPHEN 650 MG RE SUPP
650.0000 mg | Freq: Four times a day (QID) | RECTAL | Status: DC | PRN
Start: 2024-06-06 — End: 2024-06-07

## 2024-06-06 MED ORDER — ACETAMINOPHEN 325 MG PO TABS
650.0000 mg | ORAL_TABLET | Freq: Four times a day (QID) | ORAL | Status: DC | PRN
  Administered 2024-06-06 – 2024-06-07 (×2): 650 mg via ORAL
  Filled 2024-06-06 (×2): qty 2

## 2024-06-06 MED ORDER — ONDANSETRON HCL 4 MG/2ML IJ SOLN
4.0000 mg | Freq: Once | INTRAMUSCULAR | Status: AC
  Administered 2024-06-06: 4 mg via INTRAVENOUS
  Filled 2024-06-06: qty 2

## 2024-06-06 NOTE — ED Notes (Signed)
Unsuccessful IV attempt x 2.  Another RN asked to attempt.   

## 2024-06-06 NOTE — Plan of Care (Signed)

## 2024-06-06 NOTE — Progress Notes (Signed)
 Echocardiogram 2D Echocardiogram has been performed.  Riley Jefferson 06/06/2024, 5:39 PM

## 2024-06-06 NOTE — ED Notes (Signed)
 Hospitalist at bedside

## 2024-06-06 NOTE — ED Provider Notes (Signed)
 [facility]PARTMENT AT [facility] Provider Note   CSN: [id number] Arrival date & time: 06/06/24  9077     Patient presents with: Level 2- FOT and Shoulder Pain   Riley Jefferson is a 63 y.o. male.   Patient arrives here with EMS.  From my history from the patient he states that around 3 AM yesterday he walked up a flight of stairs to go to bed got dizzy lightheaded and next thing he knew he was at the bottom of the stairs and had maybe gone down 12 steps.  He is having mostly pain in the right arm but having a headache.  He has been having dizziness and headache for most of yesterday.  He was able to crawl himself to bed eventually by fall and up the stairs.  He woke up this morning continuing to have right shoulder pain and dizziness when he tries to get up and walk.  She does not really remember what happened.  He thinks he lost consciousness.  He is on Eliquis  for A-fib but states he has not taken it for 2 days.  He says that his blood pressure has been high.  He denies any weakness numbness tingling.  When I inquired about his right eye looking slightly abnormal he states that he has not been able to see out of his right eye for several months but has not had this evaluated by anybody.  The history is provided by the patient.       Prior to Admission medications   Medication Sig Start Date End Date Taking? Authorizing Provider  apixaban  (ELIQUIS ) 5 MG TABS tablet Take 1 tablet (5 mg total) by mouth 2 (two) times daily. 03/15/24   Thukkani, Arun K, MD  cyclobenzaprine (FLEXERIL) 10 MG tablet Take 1 tablet (10 mg total) by mouth 2 (two) times daily as needed for muscle spasms. 04/29/24   Robinson, John K, PA-C  empagliflozin  (JARDIANCE ) 10 MG TABS tablet Take 1 tablet (10 mg total) by mouth daily. 03/15/24   Thukkani, Arun K, MD  furosemide  (LASIX ) 40 MG tablet Take 1 tablet (40 mg total) by mouth 2 (two) times daily. 03/15/24   Thukkani, Arun K, MD   HYDROcodone -acetaminophen  (NORCO/VICODIN) 5-325 MG tablet Take 1 tablet by mouth every 6 (six) hours as needed. 04/13/24   Towana Ozell BROCKS, MD  latanoprost  (XALATAN ) 0.005 % ophthalmic solution Place 1 drop into both eyes at bedtime. 12/19/22   [provider]  lidocaine  (LIDODERM ) 5 % Place 1 patch onto the skin daily. Remove & Discard patch within 12 hours or as directed by MD 04/29/24   Lang Norleen POUR, PA-C  losartan  (COZAAR ) 25 MG tablet Take 1 tablet (25 mg total) by mouth daily. Patient not taking: Reported on 04/07/2024 03/15/24   Thukkani, Arun K, MD  methocarbamol  (ROBAXIN ) 500 MG tablet Take 1 tablet (500 mg total) by mouth every 8 (eight) hours as needed for muscle spasms. 04/13/24   Towana Ozell BROCKS, MD  metoprolol  succinate (TOPROL -XL) 25 MG 24 hr tablet Take 0.5 tablets (12.5 mg total) by mouth daily. Patient taking differently: Take 25 mg by mouth daily. 10/04/23   Thukkani, Arun K, MD    Allergies: Patient has no known allergies.    Review of Systems  Updated Vital Signs BP (!) 189/81   Pulse 64   Temp 98 F (36.7 C) (Oral)   Resp 20   Ht 6' 4 (1.93 m)   Wt 133.8 kg  SpO2 99%   BMI 35.91 kg/m   Physical Exam Vitals and nursing note reviewed.  Constitutional:      General: He is not in acute distress.    Appearance: He is well-developed.  HENT:     Head: Normocephalic and atraumatic.  Eyes:     Conjunctiva/sclera: Conjunctivae normal.     Comments: His right eye seems a little bit down into the right and somewhat of a lazy pattern he cannot really make out my fingers on eye exam but can see light, left eye is unremarkable  Neck:     Comments: In cervical collar Cardiovascular:     Rate and Rhythm: Normal rate and regular rhythm.     Pulses: Normal pulses.     Heart sounds: Normal heart sounds. No murmur heard. Pulmonary:     Effort: Pulmonary effort is normal. No respiratory distress.     Breath sounds: Normal breath sounds.  Abdominal:      Palpations: Abdomen is soft.     Tenderness: There is no abdominal tenderness.  Musculoskeletal:        General: Tenderness present. No swelling.     Cervical back: Neck supple.     Comments: Tenderness to the right shoulder right humerus, no specific midline spinal tenderness  Skin:    General: Skin is warm and dry.     Capillary Refill: Capillary refill takes less than 2 seconds.  Neurological:     General: No focal deficit present.     Mental Status: He is alert and oriented to person, place, and time.     Cranial Nerves: No cranial nerve deficit.     Sensory: No sensory deficit.     Motor: No weakness.     Coordination: Coordination normal.     Comments: Appears to have normal speech normal strength and sensation, his right eye has vision loss compared to his left eye  Psychiatric:        Mood and Affect: Mood normal.     (all labs ordered are listed, but only abnormal results are displayed) Labs Reviewed  CBC WITH DIFFERENTIAL/PLATELET - Abnormal; Notable for the following components:      Result Value   RDW 15.6 (*)    All other components within normal limits  COMPREHENSIVE METABOLIC PANEL WITH GFR - Abnormal; Notable for the following components:   Calcium  8.5 (*)    All other components within normal limits  I-STAT CHEM 8, ED - Abnormal; Notable for the following components:   Hemoglobin 17.3 (*)    All other components within normal limits  LIPASE, BLOOD  URINALYSIS, ROUTINE W REFLEX MICROSCOPIC  CBC WITH DIFFERENTIAL/PLATELET  TROPONIN I (HIGH SENSITIVITY)  TROPONIN I (HIGH SENSITIVITY)    EKG: EKG Interpretation Date/Time:  Tuesday June 06 2024 09:34:14 EST Ventricular Rate:  111 PR Interval:    QRS Duration:  115 QT Interval:  360 QTC Calculation: 490 R Axis:   -17  Text Interpretation: Atrial fibrillation Probable left ventricular hypertrophy Confirmed by Ruthe Cornet [phone]) on 06/06/2024 10:05:27 AM  Radiology: [facility]A Humerus Right Result Date:  06/06/2024 CLINICAL DATA:  Pain after a fall. EXAM: RIGHT HUMERUS - 2+ VIEW COMPARISON:  None Available. FINDINGS: No evidence for an acute fracture in the humerus. Mild degenerative changes are seen in the acromioclavicular joint. No worrisome lytic or sclerotic osseous abnormality. IMPRESSION: 1. No acute bony findings. 2. Mild degenerative changes in the acromioclavicular joint. Electronically Signed   By: Camellia Minus HERO.D.  On: 06/06/2024 12:13   DG Shoulder Right Portable Result Date: 06/06/2024 CLINICAL DATA:  Shoulder pain after a fall. EXAM: RIGHT SHOULDER - 1 VIEW COMPARISON:  No comparison studies available. FINDINGS: No evidence for an acute fracture. No shoulder separation or dislocation. IMPRESSION: Negative. Electronically Signed   By: Camellia Candle M.D.   On: 06/06/2024 12:13   CT CHEST ABDOMEN PELVIS W CONTRAST Result Date: 06/06/2024 EXAM: CT CHEST, ABDOMEN AND PELVIS WITH CONTRAST 06/06/2024 11:00:50 AM TECHNIQUE: CT of the chest, abdomen and pelvis was performed with the administration of iohexol  (OMNIPAQUE ) 350 MG/ML injection. Multiplanar reformatted images are provided for review. Automated exposure control, iterative reconstruction, and/or weight based adjustment of the mA/kV was utilized to reduce the radiation dose to as low as reasonably achievable. COMPARISON: CT chest, abdomen, and pelvis 04/29/2024. CLINICAL HISTORY: 63 year old male status post fall on blood thinners. FINDINGS: CHEST: MEDIASTINUM AND LYMPH NODES: Heart and pericardium are unremarkable. The central airways are clear. No mediastinal, hilar or axillary lymphadenopathy. Mild for age calcified atherosclerosis of the thoracic aorta. LUNGS AND PLEURA: Mild respiratory motion stable somewhat low lung volumes and mild dependent atelectasis. Stable tiny left lower lobe subpleural lung nodule on series 4 image 102 (no follow up imaging recommended). No focal consolidation or pulmonary edema. No pleural effusion or  pneumothorax. No acute traumatic injury identified in the imaged chest. ABDOMEN AND PELVIS: LIVER: The liver is unremarkable. GALLBLADDER AND BILE DUCTS: Gallbladder is unremarkable. No biliary ductal dilatation. SPLEEN: No acute abnormality. PANCREAS: No acute abnormality. ADRENAL GLANDS: No acute abnormality. KIDNEYS, URETERS AND BLADDER: Small benign bilateral renal cysts, the largest with simple fluid density (no follow up imaging recommended). No stones in the kidneys or ureters. No hydronephrosis. No perinephric or periureteral stranding. Urinary bladder is unremarkable. GI AND BOWEL: Mostly decompressed stomach. Redundant sigmoid colon. Nondilated bowel loops. Normal gas containing appendix on series 3 image 101. There is no bowel obstruction. REPRODUCTIVE ORGANS: No acute abnormality. PERITONEUM AND RETROPERITONEUM: No ascites. No free air. VASCULATURE: Aorta is normal in caliber. Stable mild tortuosity of the abdominal aorta and iliac arteries. Major arterial structures in the abdomen and pelvis are patent. On the delayed phase images portal venous system is grossly patent. ABDOMINAL AND PELVIS LYMPH NODES: No lymphadenopathy. BONES AND SOFT TISSUES: Intermittent bulky thoracic spine posterior element degeneration is chronic. Normal thoracic and lumbar segmentation. Stable vertebral height. Degenerative appearing ankylosis of the SI joints. Partially visible chronic left femur ORIF retained metal foreign body near the left greater trochanter. No acute osseous abnormality. No superficial soft tissue injury identified. No focal soft tissue abnormality. No acute traumatic injury identified in the visualized osseous structures and soft tissues. IMPRESSION: 1. No acute traumatic injury identified in the chest, abdomen, and pelvis. Electronically signed by: Helayne Hurst MD 06/06/2024 11:15 AM EST RP Workstation: [id number]   CT Cervical Spine Wo Contrast Result Date: 06/06/2024 EXAM: CT CERVICAL SPINE WITHOUT  CONTRAST 06/06/2024 11:00:50 AM TECHNIQUE: CT of the cervical spine was performed without the administration of intravenous contrast. Multiplanar reformatted images are provided for review. Automated exposure control, iterative reconstruction, and/or weight based adjustment of the mA/kV was utilized to reduce the radiation dose to as low as reasonably achievable. COMPARISON: None available. CLINICAL HISTORY: 63 year old male status post fall on blood thinners. FINDINGS: CERVICAL SPINE: BONES AND ALIGNMENT: Relatively normal cervical lordosis. Normal bone mineralization for age. Mild dextroconvex cervical scoliosis. No acute fracture or traumatic malalignment. DEGENERATIVE CHANGES: Advanced chronic cervical facet degeneration on  the left at C2-C3. Chronic lower cervical disc and endplate degeneration. No definite spinal stenosis by CT. SOFT TISSUES: No prevertebral soft tissue swelling. Mild for age calcified cervical carotid artery atherosclerosis. Negative visible non-contrast thoracic inlet. IMPRESSION: 1. No acute traumatic injury identified in the cervical spine. 2. Chronic cervical spine degeneration. Electronically signed by: Helayne Hurst MD 06/06/2024 11:09 AM EST RP Workstation: [id number]   CT Head Wo Contrast Result Date: 06/06/2024 EXAM: CT HEAD WITHOUT CONTRAST 06/06/2024 11:00:50 AM TECHNIQUE: CT of the head was performed without the administration of intravenous contrast. Automated exposure control, iterative reconstruction, and/or weight based adjustment of the mA/kV was utilized to reduce the radiation dose to as low as reasonably achievable. COMPARISON: None available. CLINICAL HISTORY: 63 year old male status post fall on blood thinners. FINDINGS: BRAIN AND VENTRICLES: No acute hemorrhage. No evidence of acute infarct. No hydrocephalus. No extra-axial collection. No mass effect or midline shift. Brain volume is within normal limits for age. Calcified atherosclerosis at the skull base. Dural  calcifications. Gray white differentiation is within normal limits for age. No suspicious intracranial vascular hyperdensity. ORBITS: No acute abnormality. SINUSES: Paranasal sinuses, middle ears and mastoids are well aerated. SOFT TISSUES AND SKULL: No acute soft tissue abnormality. No skull fracture. IMPRESSION: 1. No acute traumatic injury identified. 2. Normal for age non-contrast head CT. Electronically signed by: Helayne Hurst MD 06/06/2024 11:07 AM EST RP Workstation: [id number]   DG Pelvis Portable Result Date: 06/06/2024 EXAM: 1 or 2 VIEW(S) XRAY OF THE PELVIS 06/06/2024 10:01:00 AM COMPARISON: None available. CLINICAL HISTORY: pain pain FINDINGS: BONES AND JOINTS: No acute fracture. No focal osseous lesion. No joint dislocation. SOFT TISSUES: The soft tissues are unremarkable. IMPRESSION: 1. No significant abnormality. Electronically signed by: Lynwood Seip MD 06/06/2024 10:23 AM EST RP Workstation: [id number]   DG Chest Portable 1 View Result Date: 06/06/2024 EXAM: 1 VIEW(S) XRAY OF THE CHEST 06/06/2024 10:01:00 AM COMPARISON: 05/01/2024 CLINICAL HISTORY: pain pain FINDINGS: LUNGS AND PLEURA: No focal pulmonary opacity. No pulmonary edema. No pleural effusion. No pneumothorax. HEART AND MEDIASTINUM: Stable cardiomediastinal silhouette. BONES AND SOFT TISSUES: No acute osseous abnormality. IMPRESSION: 1. No acute cardiopulmonary process. Electronically signed by: Lynwood Seip MD 06/06/2024 10:20 AM EST RP Workstation: [id number]     Procedures   Medications Ordered in the ED  fentaNYL  (SUBLIMAZE ) injection 50 mcg (50 mcg Intravenous Given 06/06/24 1041)  iohexol  (OMNIPAQUE ) 350 MG/ML injection 100 mL (100 mLs Intravenous Contrast Given 06/06/24 1101)  ondansetron  (ZOFRAN ) injection 4 mg (4 mg Intravenous Given 06/06/24 1117)  fentaNYL  (SUBLIMAZE ) injection 50 mcg (50 mcg Intravenous Given 06/06/24 1116)                                    Medical Decision Making Amount and/or Complexity  of Data Reviewed Labs: ordered. Radiology: ordered.  Risk Prescription drug management. Decision regarding hospitalization.   Riley Jefferson is here after what sounds like possibly a syncopal episode while going up a flight of stairs.  He fell backwards landing at the bottom of the stairs.  Does not really remember everything that happens.  He was able to eventually crawl himself back up the stairs and get in bed.  He woke up this morning with ongoing left shoulder left arm pain in the humerus area.  But he is also been dealing with high blood pressure dizziness headache since yesterday.  He is not sure if the dizziness  made him fall or some other issue.  I do notice some abnormality in his right eye where it seems to be down and out on exam and he can only see light on exam.  He states however that is been that way for 6 months but he has not seek any medical attention for this.  I see several ED visits here the last few months and I do not see any mention about abnormality to the right eye.  He is not Eliquis  for A-fib but he states he has not taken it for 2 days.  His blood pressure is elevated.  Does have history of hypertension and A-fib.  He has mild tachycardia with A-fib rhythm upon arrival here.  He states he lives at home with his sister.  Overall we will make him a level 2 trauma which was not activated in the field.  Will get a CT scan of his head neck chest abdomen pelvis basic labs troponin and pursue syncope workup as well.  Overall CT scans are unremarkable.  Urinalysis with no significant leukocytosis anemia or electrolyte abnormality otherwise on lab work.  Urinalysis negative.  Troponin normal.  Overall I am concerned for syncope and need for further workup.  May be need for MRI to further evaluate dizzy episodes/telemetry/echocardiogram.  No obvious traumatic findings otherwise.  X-rays of the right shoulder were okay.  Still mildly hypertensive here.  Will admit to medicine for  further syncope workup.  This chart was dictated using voice recognition software.  Despite best efforts to proofread,  errors can occur which can change the documentation meaning.      Final diagnoses:  Acute pain of right shoulder  Syncope, unspecified syncope type    ED Discharge Orders     None          Ruthe Cornet, DO 06/06/24 1225

## 2024-06-06 NOTE — Progress Notes (Signed)
 Orthopedic Tech Progress Note Patient Details:  Riley Jefferson 1960/10/04 [id number]  Level 2 trauma   Patient ID: Riley Jefferson, male   DOB: Feb 08, 1961, 63 y.o.   MRN: [id number]  Riley Jefferson 06/06/2024, 10:11 AM

## 2024-06-06 NOTE — ED Notes (Signed)
 Pt given sandwich bag, crackers x3, and ginger ale x2.

## 2024-06-06 NOTE — ED Notes (Signed)
 Patient transported to CT

## 2024-06-06 NOTE — ED Notes (Signed)
 CCMD contacted to place pt on cardiac monitoring

## 2024-06-06 NOTE — ED Notes (Signed)
 Pt given sandwich, tylenol , and cranberry juice.

## 2024-06-06 NOTE — ED Notes (Signed)
 Patient transported to MRI

## 2024-06-06 NOTE — H&P (Signed)
 History and Physical   Riley Jefferson FMW:[id number] DOB: 07/03/1961 DOA: 06/06/2024  PCP: Thukkani, Arun K, MD   Patient coming from: Home  Chief Complaint: Syncope and collapse  HPI: Riley Jefferson is a 63 y.o. male with medical history significant of hypertension, A-fib, anemia presenting after fall downstairs at home after syncopal episode.  Patient reports he was walking up the stairs in [place] last night and became dizzy and lightheadedness No he will get the bottom of the stairs.  He may have had some dizziness beforehand.  He reports he fell down around 12 stairs.  Noted to have some right arm pain and a headache afterwards.  Woke up today with continued pain and came to the ED for further evaluation.  Reports that he has not had his medication including his Eliquis  for the past couple days.  Sister reports that he misplaced his keys and his medications are in his car.  Patient additionally reports he has had about 6 months of vision loss in his right eye where the vision loss came on fairly suddenly.  Patient denies other focal neurologic deficits.  Denies fevers, chills, chest pain, shortness of breath, abdominal pain, constipation, diarrhea, nausea, vomiting.  ED Course: Vital signs in the ED notable for blood pressure in the 150s-180s systolic.  Lab workup included CMP with calcium  8.5.  CBC within normal limits.  Troponin negative x 2.  Lipase normal.  Urinalysis normal.  Trauma imaging included chest x-ray which showed no acute abnormality.  Pelvic x-ray which showed no acute abnormality.  Right shoulder and right humerus x-rays which showed no acute normality.  CT head and CT C-spine showed no acute abnormality. CT chest abdomen pelvis showed no acute abnormality.  Patient received fentanyl  and Zofran  in the ED.  Review of Systems: As per HPI otherwise all other systems reviewed and are negative.  Past Medical History:  Diagnosis Date   Atrial fibrillation (HCC)     Hypertension     History reviewed. No pertinent surgical history.  Social History  reports that he quit smoking about 22 years ago. His smoking use included cigarettes. He started smoking about 42 years ago. He has a 20 pack-year smoking history. He has never used smokeless tobacco. He reports that he does not drink alcohol and does not use drugs.  No Known Allergies  Family History  Problem Relation Age of Onset   Stroke Father   Reviewed on admission  Prior to Admission medications   Medication Sig Start Date End Date Taking? Authorizing Provider  apixaban  (ELIQUIS ) 5 MG TABS tablet Take 1 tablet (5 mg total) by mouth 2 (two) times daily. 03/15/24  Yes Thukkani, Arun K, MD  cyclobenzaprine (FLEXERIL) 10 MG tablet Take 1 tablet (10 mg total) by mouth 2 (two) times daily as needed for muscle spasms. 04/29/24  Yes Robinson, John K, PA-C  empagliflozin  (JARDIANCE ) 10 MG TABS tablet Take 1 tablet (10 mg total) by mouth daily. 03/15/24  Yes Thukkani, Arun K, MD  furosemide  (LASIX ) 40 MG tablet Take 1 tablet (40 mg total) by mouth 2 (two) times daily. 03/15/24  Yes Thukkani, Arun K, MD  HYDROcodone -acetaminophen  (NORCO/VICODIN) 5-325 MG tablet Take 1 tablet by mouth every 6 (six) hours as needed. 04/13/24  Yes Towana Ozell BROCKS, MD  latanoprost  (XALATAN ) 0.005 % ophthalmic solution Place 1 drop into both eyes at bedtime. 12/19/22  Yes [provider]  lidocaine  (LIDODERM ) 5 % Place 1 patch onto the skin daily. Remove & Discard patch  within 12 hours or as directed by MD Patient taking differently: Place 1 patch onto the skin daily as needed (Pain). Remove & Discard patch within 12 hours or as directed by MD 04/29/24  Yes Lang Norleen POUR, PA-C  methocarbamol  (ROBAXIN ) 500 MG tablet Take 1 tablet (500 mg total) by mouth every 8 (eight) hours as needed for muscle spasms. 04/13/24  Yes Towana Ozell BROCKS, MD  losartan  (COZAAR ) 25 MG tablet Take 1 tablet (25 mg total) by mouth daily. Patient not  taking: Reported on 04/07/2024 03/15/24   Thukkani, Arun K, MD  metoprolol  succinate (TOPROL -XL) 25 MG 24 hr tablet Take 0.5 tablets (12.5 mg total) by mouth daily. Patient not taking: Reported on 06/06/2024 10/04/23   Wendel Lurena POUR, MD    Physical Exam: Vitals:   06/06/24 1215 06/06/24 1300 06/06/24 1335 06/06/24 1400  BP: (!) 189/81 (!) 174/103 (!) 154/86 (!) 153/104  Pulse: 64 (!) 56 64 (!) 48  Resp: 20 12 20 15   Temp:    98 F (36.7 C)  TempSrc:      SpO2: 99% 99% 100% 99%  Weight:      Height:        Physical Exam Constitutional:      General: He is not in acute distress.    Appearance: Normal appearance.  HENT:     Head: Normocephalic and atraumatic.     Mouth/Throat:     Mouth: Mucous membranes are moist.     Pharynx: Oropharynx is clear.  Eyes:     Extraocular Movements: Extraocular movements intact.     Pupils: Pupils are equal, round, and reactive to light.  Cardiovascular:     Rate and Rhythm: Normal rate and regular rhythm.     Pulses: Normal pulses.     Heart sounds: Normal heart sounds.  Pulmonary:     Effort: Pulmonary effort is normal. No respiratory distress.     Breath sounds: Normal breath sounds.  Abdominal:     General: Bowel sounds are normal. There is no distension.     Palpations: Abdomen is soft.     Tenderness: There is no abdominal tenderness.  Musculoskeletal:        General: No swelling or deformity.  Skin:    General: Skin is warm and dry.  Neurological:     Comments: Mental Status: Patient is awake, alert, oriented No signs of aphasia or neglect Cranial Nerves: II: Pupils equal, round, and reactive to light.   III,IV, VI: EOMI without ptosis or diploplia, some nystagmus with leftward gaze. V: Facial sensation is symmetric to light touch. VII: Facial movement is symmetric.  VIII: hearing is intact to voice X: Uvula elevates symmetrically XI: Shoulder shrug is symmetric. XII: tongue is midline without atrophy or fasciculations.   Motor: Good effort thorughout right upper extremity evaluation limited by pain.  Otherwise, at Least 5/5 bilateral UE, 5/5 bilateral lower extremitiy  Sensory: Sensation is grossly intact bilateral UEs & LEs    Labs on Admission: I have personally reviewed following labs and imaging studies  CBC: Recent Labs  Lab 06/06/24 0955 06/06/24 1100  WBC  --  6.1  NEUTROABS  --  3.4  HGB 17.3* 15.8  HCT 51.0 48.3  MCV  --  84.6  PLT  --  224    Basic Metabolic Panel: Recent Labs  Lab 06/06/24 0955 06/06/24 1100  NA 143 139  K 4.3 4.2  CL 106 107  CO2  --  24  GLUCOSE  98 96  BUN 17 13  CREATININE 0.90 0.99  CALCIUM   --  8.5*    GFR: Estimated Creatinine Clearance: 111.1 mL/min (by C-G formula based on SCr of 0.99 mg/dL).  Liver Function Tests: Recent Labs  Lab 06/06/24 1100  AST 26  ALT 21  ALKPHOS 76  BILITOT 1.0  PROT 7.8  ALBUMIN 3.6    Urine analysis:    Component Value Date/Time   COLORURINE YELLOW 06/06/2024 1100   APPEARANCEUR CLEAR 06/06/2024 1100   LABSPEC 1.028 06/06/2024 1100   PHURINE 6.0 06/06/2024 1100   GLUCOSEU NEGATIVE 06/06/2024 1100   HGBUR NEGATIVE 06/06/2024 1100   BILIRUBINUR NEGATIVE 06/06/2024 1100   BILIRUBINUR negative 05/03/2024 1856   KETONESUR NEGATIVE 06/06/2024 1100   PROTEINUR NEGATIVE 06/06/2024 1100   UROBILINOGEN 0.2 05/03/2024 1856   NITRITE NEGATIVE 06/06/2024 1100   LEUKOCYTESUR NEGATIVE 06/06/2024 1100    Radiological Exams on Admission: DG Humerus Right Result Date: 06/06/2024 CLINICAL DATA:  Pain after a fall. EXAM: RIGHT HUMERUS - 2+ VIEW COMPARISON:  None Available. FINDINGS: No evidence for an acute fracture in the humerus. Mild degenerative changes are seen in the acromioclavicular joint. No worrisome lytic or sclerotic osseous abnormality. IMPRESSION: 1. No acute bony findings. 2. Mild degenerative changes in the acromioclavicular joint. Electronically Signed   By: Camellia Candle M.D.   On: 06/06/2024 12:13    DG Shoulder Right Portable Result Date: 06/06/2024 CLINICAL DATA:  Shoulder pain after a fall. EXAM: RIGHT SHOULDER - 1 VIEW COMPARISON:  No comparison studies available. FINDINGS: No evidence for an acute fracture. No shoulder separation or dislocation. IMPRESSION: Negative. Electronically Signed   By: Camellia Candle M.D.   On: 06/06/2024 12:13   CT CHEST ABDOMEN PELVIS W CONTRAST Result Date: 06/06/2024 EXAM: CT CHEST, ABDOMEN AND PELVIS WITH CONTRAST 06/06/2024 11:00:50 AM TECHNIQUE: CT of the chest, abdomen and pelvis was performed with the administration of iohexol  (OMNIPAQUE ) 350 MG/ML injection. Multiplanar reformatted images are provided for review. Automated exposure control, iterative reconstruction, and/or weight based adjustment of the mA/kV was utilized to reduce the radiation dose to as low as reasonably achievable. COMPARISON: CT chest, abdomen, and pelvis 04/29/2024. CLINICAL HISTORY: 63 year old male status post fall on blood thinners. FINDINGS: CHEST: MEDIASTINUM AND LYMPH NODES: Heart and pericardium are unremarkable. The central airways are clear. No mediastinal, hilar or axillary lymphadenopathy. Mild for age calcified atherosclerosis of the thoracic aorta. LUNGS AND PLEURA: Mild respiratory motion stable somewhat low lung volumes and mild dependent atelectasis. Stable tiny left lower lobe subpleural lung nodule on series 4 image 102 (no follow up imaging recommended). No focal consolidation or pulmonary edema. No pleural effusion or pneumothorax. No acute traumatic injury identified in the imaged chest. ABDOMEN AND PELVIS: LIVER: The liver is unremarkable. GALLBLADDER AND BILE DUCTS: Gallbladder is unremarkable. No biliary ductal dilatation. SPLEEN: No acute abnormality. PANCREAS: No acute abnormality. ADRENAL GLANDS: No acute abnormality. KIDNEYS, URETERS AND BLADDER: Small benign bilateral renal cysts, the largest with simple fluid density (no follow up imaging recommended). No  stones in the kidneys or ureters. No hydronephrosis. No perinephric or periureteral stranding. Urinary bladder is unremarkable. GI AND BOWEL: Mostly decompressed stomach. Redundant sigmoid colon. Nondilated bowel loops. Normal gas containing appendix on series 3 image 101. There is no bowel obstruction. REPRODUCTIVE ORGANS: No acute abnormality. PERITONEUM AND RETROPERITONEUM: No ascites. No free air. VASCULATURE: Aorta is normal in caliber. Stable mild tortuosity of the abdominal aorta and iliac arteries. Major arterial structures in  the abdomen and pelvis are patent. On the delayed phase images portal venous system is grossly patent. ABDOMINAL AND PELVIS LYMPH NODES: No lymphadenopathy. BONES AND SOFT TISSUES: Intermittent bulky thoracic spine posterior element degeneration is chronic. Normal thoracic and lumbar segmentation. Stable vertebral height. Degenerative appearing ankylosis of the SI joints. Partially visible chronic left femur ORIF retained metal foreign body near the left greater trochanter. No acute osseous abnormality. No superficial soft tissue injury identified. No focal soft tissue abnormality. No acute traumatic injury identified in the visualized osseous structures and soft tissues. IMPRESSION: 1. No acute traumatic injury identified in the chest, abdomen, and pelvis. Electronically signed by: Helayne Hurst MD 06/06/2024 11:15 AM EST RP Workstation: [id number]   CT Cervical Spine Wo Contrast Result Date: 06/06/2024 EXAM: CT CERVICAL SPINE WITHOUT CONTRAST 06/06/2024 11:00:50 AM TECHNIQUE: CT of the cervical spine was performed without the administration of intravenous contrast. Multiplanar reformatted images are provided for review. Automated exposure control, iterative reconstruction, and/or weight based adjustment of the mA/kV was utilized to reduce the radiation dose to as low as reasonably achievable. COMPARISON: None available. CLINICAL HISTORY: 63 year old male status post fall on blood  thinners. FINDINGS: CERVICAL SPINE: BONES AND ALIGNMENT: Relatively normal cervical lordosis. Normal bone mineralization for age. Mild dextroconvex cervical scoliosis. No acute fracture or traumatic malalignment. DEGENERATIVE CHANGES: Advanced chronic cervical facet degeneration on the left at C2-C3. Chronic lower cervical disc and endplate degeneration. No definite spinal stenosis by CT. SOFT TISSUES: No prevertebral soft tissue swelling. Mild for age calcified cervical carotid artery atherosclerosis. Negative visible non-contrast thoracic inlet. IMPRESSION: 1. No acute traumatic injury identified in the cervical spine. 2. Chronic cervical spine degeneration. Electronically signed by: Helayne Hurst MD 06/06/2024 11:09 AM EST RP Workstation: [id number]   CT Head Wo Contrast Result Date: 06/06/2024 EXAM: CT HEAD WITHOUT CONTRAST 06/06/2024 11:00:50 AM TECHNIQUE: CT of the head was performed without the administration of intravenous contrast. Automated exposure control, iterative reconstruction, and/or weight based adjustment of the mA/kV was utilized to reduce the radiation dose to as low as reasonably achievable. COMPARISON: None available. CLINICAL HISTORY: 63 year old male status post fall on blood thinners. FINDINGS: BRAIN AND VENTRICLES: No acute hemorrhage. No evidence of acute infarct. No hydrocephalus. No extra-axial collection. No mass effect or midline shift. Brain volume is within normal limits for age. Calcified atherosclerosis at the skull base. Dural calcifications. Gray white differentiation is within normal limits for age. No suspicious intracranial vascular hyperdensity. ORBITS: No acute abnormality. SINUSES: Paranasal sinuses, middle ears and mastoids are well aerated. SOFT TISSUES AND SKULL: No acute soft tissue abnormality. No skull fracture. IMPRESSION: 1. No acute traumatic injury identified. 2. Normal for age non-contrast head CT. Electronically signed by: Helayne Hurst MD 06/06/2024 11:07 AM  EST RP Workstation: [id number]   DG Pelvis Portable Result Date: 06/06/2024 EXAM: 1 or 2 VIEW(S) XRAY OF THE PELVIS 06/06/2024 10:01:00 AM COMPARISON: None available. CLINICAL HISTORY: pain pain FINDINGS: BONES AND JOINTS: No acute fracture. No focal osseous lesion. No joint dislocation. SOFT TISSUES: The soft tissues are unremarkable. IMPRESSION: 1. No significant abnormality. Electronically signed by: Lynwood Seip MD 06/06/2024 10:23 AM EST RP Workstation: [id number]   DG Chest Portable 1 View Result Date: 06/06/2024 EXAM: 1 VIEW(S) XRAY OF THE CHEST 06/06/2024 10:01:00 AM COMPARISON: 05/01/2024 CLINICAL HISTORY: pain pain FINDINGS: LUNGS AND PLEURA: No focal pulmonary opacity. No pulmonary edema. No pleural effusion. No pneumothorax. HEART AND MEDIASTINUM: Stable cardiomediastinal silhouette. BONES AND SOFT TISSUES: No acute osseous abnormality. IMPRESSION: 1.  No acute cardiopulmonary process. Electronically signed by: Lynwood Seip MD 06/06/2024 10:20 AM EST RP Workstation: [id number]   EKG: Independently reviewed.  Atrial fibrillation at 100 Levan beats per minute.  Nonspecific T wave changes.  Baseline wander.  QTc borderline at 490.  Similar to previous.  Assessment/Plan Principal Problem:   Syncope and collapse Active Problems:   Normocytic anemia   Hypertension   Permanent atrial fibrillation (HCC)   Syncope Monocular vision loss > Patient presenting after episode of syncope where he fell down some stairs, possibly as many as 12, last night. > Also reports 6 months of right vision loss that came on suddenly. > Patient has history of atrial fibrillation and is intermittently nonadherent to his Eliquis .  Currently his medication is in his car and he lost his keys. > Reports some dizziness prior to his episode prompting his fall. > With this constellation of symptoms and intermittent Eliquis  in the setting of A-fib.  Will evaluate for syncope as well as stroke. - Monitor on telemetry  overnight - MRI brain - Will forego stroke swallow screen, as he has been eating and drinking well in the ED already - Echocardiogram - Permissive hypertension pending MRI - Hold off on anticoagulation given recent trauma and MRI that is pending - PT/OT eval and treat  Hypertension - Permissive hypertension pending results of MRI - Currently is prescribed Lasix  outpatient  Atrial fibrillation - Will need to resume Eliquis  soon - Does not appear to be currently taking metoprolol , I do see history of SVR in chart  Anemia - Hemoglobin currently stable   DVT prophylaxis: SCDs Code Status:   Full Family Communication:  Sister updated by phone. Disposition Plan:   Patient is from:  Home  Anticipated DC to:  Home  Anticipated DC date:  1 to 3 days  Anticipated DC barriers: None  Consults called:  None Admission status:  Observation, telemetry  Severity of Illness: The appropriate patient status for this patient is OBSERVATION. Observation status is judged to be reasonable and necessary in order to provide the required intensity of service to ensure the patient's safety. The patient's presenting symptoms, physical exam findings, and initial radiographic and laboratory data in the context of their medical condition is felt to place them at decreased risk for further clinical deterioration. Furthermore, it is anticipated that the patient will be medically stable for discharge from the hospital within 2 midnights of admission.    Marsa KATHEE Scurry MD [facility]  How to contact the [facility] Attending or Consulting provider 7A - 7P or covering provider during after hours 7P -7A, for this patient?   Check the care team in [facility] and look for a) attending/consulting [facility] provider listed and b) the [facility] team listed Log into [email or web address] and use [place]'s universal password to access. If you do not have the password, please contact the hospital operator. Locate the [facility] provider you are  looking for under [facility] and page to a number that you can be directly reached. If you still have difficulty reaching the provider, please page the [facility] (Director on Call) for the Hospitalists listed on amion for assistance.  06/06/2024, 2:23 PM

## 2024-06-06 NOTE — ED Notes (Addendum)
 Spoke to Pt's sister and provided an update.  Sister mentioned he has not had any of his medications for several days.  Sts he misplaced his keys.  Sister would like the Pt to have a CSW consult.

## 2024-06-06 NOTE — ED Triage Notes (Signed)
 Per EMS, Pt, from home, c/o R should pain following a fall down around 12 steps around 0300.  Pain score 10/10.  Pt does not remember the fall.  Sts he has been dizzy and had a headache all day.  Abrasion noted to R shoulder.  Pt reports taking Eliquis  but has held it x3 days.  Hx of A fib.    C collar in place.

## 2024-06-07 ENCOUNTER — Other Ambulatory Visit (HOSPITAL_COMMUNITY): Payer: Self-pay

## 2024-06-07 DIAGNOSIS — R55 Syncope and collapse: Secondary | ICD-10-CM | POA: Diagnosis not present

## 2024-06-07 LAB — COMPREHENSIVE METABOLIC PANEL WITH GFR
ALT: 23 U/L (ref 0–44)
AST: 23 U/L (ref 15–41)
Albumin: 3.2 g/dL — ABNORMAL LOW (ref 3.5–5.0)
Alkaline Phosphatase: 74 U/L (ref 38–126)
Anion gap: 10 (ref 5–15)
BUN: 13 mg/dL (ref 8–23)
CO2: 23 mmol/L (ref 22–32)
Calcium: 8.8 mg/dL — ABNORMAL LOW (ref 8.9–10.3)
Chloride: 106 mmol/L (ref 98–111)
Creatinine, Ser: 1.09 mg/dL (ref 0.61–1.24)
GFR, Estimated: >60 mL/min (ref >60)
Glucose, Bld: 111 mg/dL — ABNORMAL HIGH (ref 70–99)
Potassium: 3.7 mmol/L (ref 3.5–5.1)
Sodium: 139 mmol/L (ref 135–145)
Total Bilirubin: 0.5 mg/dL (ref 0.0–1.2)
Total Protein: 7 g/dL (ref 6.5–8.1)

## 2024-06-07 LAB — MAGNESIUM: Magnesium: 2.1 mg/dL (ref 1.7–2.4)

## 2024-06-07 LAB — HIV ANTIBODY (ROUTINE TESTING W REFLEX): HIV Screen 4th Generation wRfx: NONREACTIVE

## 2024-06-07 MED ORDER — SODIUM CHLORIDE 0.9% FLUSH: 10.0000 mL | INTRAVENOUS | Status: DC | PRN

## 2024-06-07 MED ORDER — APIXABAN 5 MG PO TABS
5.0000 mg | ORAL_TABLET | Freq: Two times a day (BID) | ORAL | Status: DC
  Administered 2024-06-07: 5 mg via ORAL
  Filled 2024-06-07: qty 1

## 2024-06-07 MED ORDER — SODIUM CHLORIDE 0.9% FLUSH
10.0000 mL | Freq: Two times a day (BID) | INTRAVENOUS | Status: DC
  Administered 2024-06-07: 10 mL

## 2024-06-07 MED ORDER — LOSARTAN POTASSIUM 50 MG PO TABS
25.0000 mg | ORAL_TABLET | Freq: Every day | ORAL | Status: DC
  Administered 2024-06-07: 25 mg via ORAL
  Filled 2024-06-07: qty 1

## 2024-06-07 MED ORDER — APIXABAN 5 MG PO TABS
5.0000 mg | ORAL_TABLET | Freq: Two times a day (BID) | ORAL | 0 refills | Status: DC
  Filled 2024-06-07: qty 60, 30d supply, fill #0

## 2024-06-07 MED ORDER — LIDOCAINE 5 % EX PTCH
1.0000 | MEDICATED_PATCH | CUTANEOUS | Status: DC
  Administered 2024-06-07: 1 via TRANSDERMAL
  Filled 2024-06-07: qty 1

## 2024-06-07 MED ORDER — LABETALOL HCL 5 MG/ML IV SOLN
20.0000 mg | INTRAVENOUS | Status: DC | PRN
  Administered 2024-06-07: 20 mg via INTRAVENOUS
  Filled 2024-06-07: qty 4

## 2024-06-07 MED ORDER — FUROSEMIDE 40 MG PO TABS
40.0000 mg | ORAL_TABLET | Freq: Two times a day (BID) | ORAL | 0 refills | Status: DC
  Filled 2024-06-07: qty 60, 30d supply, fill #0

## 2024-06-07 MED ORDER — EMPAGLIFLOZIN 10 MG PO TABS
10.0000 mg | ORAL_TABLET | Freq: Every day | ORAL | 0 refills | Status: AC
  Filled 2024-06-07: qty 30, 30d supply, fill #0

## 2024-06-07 MED ORDER — LOSARTAN POTASSIUM 25 MG PO TABS
25.0000 mg | ORAL_TABLET | Freq: Every day | ORAL | 0 refills | Status: DC
  Filled 2024-06-07: qty 30, 30d supply, fill #0

## 2024-06-07 NOTE — Plan of Care (Signed)
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
## 2024-06-07 NOTE — Discharge Summary (Signed)
 Physician Discharge Summary  Riley Jefferson FMW:[id number] DOB: 1960-09-02 DOA: 06/06/2024  PCP: Wendel Lurena POUR, MD  Admit date: 06/06/2024 Discharge date: 06/07/2024  Admitted From:  Discharge disposition: home   Recommendations for Outpatient Follow-Up:   Needs compliance with meds Pcp appointment set up for next weeks Needs to see cardiology   Discharge Diagnosis:   Principal Problem:   Syncope and collapse Active Problems:   Normocytic anemia   Hypertension   Permanent atrial fibrillation (HCC)    Discharge Condition: Improved.  Diet recommendation: Low sodium, heart healthy  Wound care: None.  Code status: Full.   History of Present Illness:    Riley Jefferson is a 63 y.o. male with medical history significant of hypertension, A-fib, anemia presenting after fall downstairs at home after syncopal episode.   Patient reports he was walking up the stairs in [place] last night and became dizzy and lightheadedness No he will get the bottom of the stairs.  He may have had some dizziness beforehand.  He reports he fell down around 12 stairs.  Noted to have some right arm pain and a headache afterwards.  Woke up today with continued pain and came to the ED for further evaluation.   Reports that he has not had his medication including his Eliquis  for the past couple days.  Sister reports that he misplaced his keys and his medications are in his car.  Patient additionally reports he has had about 6 months of vision loss in his right eye where the vision loss came on fairly suddenly.   Patient denies other focal neurologic deficits.  Denies fevers, chills, chest pain, shortness of breath, abdominal pain, constipation, diarrhea, nausea, vomiting.   Hospital Course by Problem:   Syncope Monocular vision loss > Patient presenting after episode of syncope where he fell down some stairs, possibly as many as 12, last night. > Also reports 6 months of right vision  loss that came on suddenly-- referral to ophthalmology placed > Patient has history of atrial fibrillation and is intermittently nonadherent to his Eliquis .  Currently his medication is in his car and he lost his keys. > Reports some dizziness prior to his episode prompting his fall. > With this constellation of symptoms and intermittent Eliquis  in the setting of A-fib.  Will evaluate for syncope as well as stroke. - MRI brain normal - Echocardiogram similar to prior - PT/OT eval and treat- home health   Hypertension - resume home meds   Atrial fibrillation - resume Eliquis  soon - Does not appear to be currently taking metoprolol -- outpatient cards follow up   Anemia - Hemoglobin currently stable     Medical Consultants:      Discharge Exam:   Vitals:   06/07/24 0510 06/07/24 1206  BP: (!) 163/113 (!) 150/85  Pulse: 72 73  Resp: 18   Temp: 98.2 F (36.8 C) 98 F (36.7 C)  SpO2: 99% 98%   Vitals:   06/06/24 2027 06/07/24 0015 06/07/24 0510 06/07/24 1206  BP:  (!) 159/84 (!) 163/113 (!) 150/85  Pulse: (!) 58 62 72 73  Resp: 18 18 18    Temp: 98.5 F (36.9 C) 97.6 F (36.4 C) 98.2 F (36.8 C) 98 F (36.7 C)  TempSrc: Oral Oral  Oral  SpO2: 100% 99% 99% 98%  Weight:      Height:        General exam: Appears calm and comfortable.   The results of significant diagnostics from this  hospitalization (including imaging, microbiology, ancillary and laboratory) are listed below for reference.     Procedures and Diagnostic Studies:   ECHOCARDIOGRAM COMPLETE Result Date: 06/06/2024    ECHOCARDIOGRAM REPORT   Patient Name:   Riley Jefferson Date of Exam: 06/06/2024 Medical Rec #:  [id number]      Height:       76.0 in Accession #:    [id number]     Weight:       295.0 lb Date of Birth:  09/19/1960       BSA:          2.613 m Patient Age:    63 years       BP:           183/104 mmHg Patient Gender: M              HR:           80 bpm. Exam Location:  Inpatient Procedure: 2D  Echo, Cardiac Doppler and Color Doppler (Both Spectral and Color            Flow Doppler were utilized during procedure). Indications:    Syncope  History:        Patient has prior history of Echocardiogram examinations, most                 recent 06/10/2022. Arrythmias:Atrial Fibrillation and                 Bradycardia, Signs/Symptoms:Syncope and                 Dizziness/Lightheadedness; Risk Factors:Hypertension and Former                 Smoker.  Sonographer:    Juliene Rucks Referring Phys: MARSA KATHEE SCURRY  Sonographer Comments: Suboptimal subcostal window and patient is obese. IMPRESSIONS  1. Left ventricular ejection fraction, by estimation, is 50 to 55%. The left ventricle has low normal function. The left ventricle has no regional wall motion abnormalities. There is moderate concentric left ventricular hypertrophy. Left ventricular diastolic function could not be evaluated.  2. Right ventricular systolic function is normal. The right ventricular size is normal.  3. Left atrial size was severely dilated.  4. The mitral valve is normal in structure. No evidence of mitral valve regurgitation. No evidence of mitral stenosis.  5. The aortic valve is tricuspid. Aortic valve regurgitation is not visualized. No aortic stenosis is present. Comparison(s): The left ventricular function has improved. FINDINGS  Left Ventricle: Left ventricular ejection fraction, by estimation, is 50 to 55%. The left ventricle has low normal function. The left ventricle has no regional wall motion abnormalities. The left ventricular internal cavity size was normal in size. There is moderate concentric left ventricular hypertrophy. Left ventricular diastolic function could not be evaluated due to atrial fibrillation. Left ventricular diastolic function could not be evaluated. Right Ventricle: The right ventricular size is normal. Right vetricular wall thickness was not well visualized. Right ventricular systolic function is normal.  Left Atrium: Left atrial size was severely dilated. Right Atrium: Right atrial size was normal in size. Pericardium: There is no evidence of pericardial effusion. Mitral Valve: The mitral valve is normal in structure. No evidence of mitral valve regurgitation. No evidence of mitral valve stenosis. Tricuspid Valve: The tricuspid valve is normal in structure. Tricuspid valve regurgitation is not demonstrated. Aortic Valve: The aortic valve is tricuspid. Aortic valve regurgitation is not visualized. No aortic stenosis is present. Pulmonic Valve: The pulmonic  valve was not well visualized. Pulmonic valve regurgitation is not visualized. No evidence of pulmonic stenosis. Aorta: The aortic root is normal in size and structure and the ascending aorta was not well visualized. Venous: The inferior vena cava was not well visualized. IAS/Shunts: The interatrial septum was not well visualized.  LEFT VENTRICLE PLAX 2D LVIDd:         4.70 cm   Diastology LVIDs:         3.50 cm   LV e' medial:  6.20 cm/s LV PW:         1.40 cm   LV e' lateral: 9.57 cm/s LV IVS:        1.50 cm LVOT diam:     2.00 cm LV SV:         48 LV SV Index:   18 LVOT Area:     3.14 cm  RIGHT VENTRICLE RV Basal diam:  3.70 cm RV Mid diam:    2.80 cm RV S prime:     17.30 cm/s LEFT ATRIUM            Index        RIGHT ATRIUM           Index LA diam:      3.90 cm  1.49 cm/m   RA Area:     14.60 cm LA Vol (A4C): 102.0 ml 39.03 ml/m  RA Volume:   35.20 ml  13.47 ml/m  AORTIC VALVE LVOT Vmax:   99.60 cm/s LVOT Vmean:  60.300 cm/s LVOT VTI:    0.152 m  AORTA Ao Root diam: 3.10 cm  SHUNTS Systemic VTI:  0.15 m Systemic Diam: 2.00 cm Jerel Croitoru MD Electronically signed by Jerel Balding MD Signature Date/Time: 06/06/2024/5:45:00 PM    Final    MR BRAIN WO CONTRAST Result Date: 06/06/2024 EXAM: MRI BRAIN WITHOUT CONTRAST 06/06/2024 04:03:35 PM TECHNIQUE: Multiplanar multisequence MRI of the head/brain was performed without the administration of intravenous  contrast. COMPARISON: CT head 06/06/2024 10:52 am. CLINICAL HISTORY: Neuro deficit, acute, stroke suspected. FINDINGS: Exam is mildly degraded by motion artifact. BRAIN AND VENTRICLES: No acute infarct. No intracranial hemorrhage. No mass. No midline shift. No hydrocephalus. Mild generalized parenchymal volume loss with enlargement of the ventricles, sulci/ fissures, and cisternal spaces, slightly advanced for the patient's stated age. There is a small focus of susceptibility in the left parietal lobe which may relate to the sequela of remote microhemorrhage. There is sparse subcortical and periventricular T2 and FLAIR signal hyperintensity likely reflecting sequelae of chronic microvascular ischemia. The sella is unremarkable. Normal flow voids. ORBITS: No acute abnormality. SINUSES AND MASTOIDS: No acute abnormality. BONES AND SOFT TISSUES: Normal marrow signal. No acute soft tissue abnormality. IMPRESSION: 1. No acute intracranial hemorrhage or acute infarction. Electronically signed by: prentice spade 06/06/2024 04:42 PM EST RP Workstation: [id number]   DG Humerus Right Result Date: 06/06/2024 CLINICAL DATA:  Pain after a fall. EXAM: RIGHT HUMERUS - 2+ VIEW COMPARISON:  None Available. FINDINGS: No evidence for an acute fracture in the humerus. Mild degenerative changes are seen in the acromioclavicular joint. No worrisome lytic or sclerotic osseous abnormality. IMPRESSION: 1. No acute bony findings. 2. Mild degenerative changes in the acromioclavicular joint. Electronically Signed   By: Camellia Candle M.D.   On: 06/06/2024 12:13   DG Shoulder Right Portable Result Date: 06/06/2024 CLINICAL DATA:  Shoulder pain after a fall. EXAM: RIGHT SHOULDER - 1 VIEW COMPARISON:  No comparison studies available. FINDINGS: No evidence  for an acute fracture. No shoulder separation or dislocation. IMPRESSION: Negative. Electronically Signed   By: Camellia Candle M.D.   On: 06/06/2024 12:13   CT CHEST ABDOMEN PELVIS W  CONTRAST Result Date: 06/06/2024 EXAM: CT CHEST, ABDOMEN AND PELVIS WITH CONTRAST 06/06/2024 11:00:50 AM TECHNIQUE: CT of the chest, abdomen and pelvis was performed with the administration of iohexol  (OMNIPAQUE ) 350 MG/ML injection. Multiplanar reformatted images are provided for review. Automated exposure control, iterative reconstruction, and/or weight based adjustment of the mA/kV was utilized to reduce the radiation dose to as low as reasonably achievable. COMPARISON: CT chest, abdomen, and pelvis 04/29/2024. CLINICAL HISTORY: 63 year old male status post fall on blood thinners. FINDINGS: CHEST: MEDIASTINUM AND LYMPH NODES: Heart and pericardium are unremarkable. The central airways are clear. No mediastinal, hilar or axillary lymphadenopathy. Mild for age calcified atherosclerosis of the thoracic aorta. LUNGS AND PLEURA: Mild respiratory motion stable somewhat low lung volumes and mild dependent atelectasis. Stable tiny left lower lobe subpleural lung nodule on series 4 image 102 (no follow up imaging recommended). No focal consolidation or pulmonary edema. No pleural effusion or pneumothorax. No acute traumatic injury identified in the imaged chest. ABDOMEN AND PELVIS: LIVER: The liver is unremarkable. GALLBLADDER AND BILE DUCTS: Gallbladder is unremarkable. No biliary ductal dilatation. SPLEEN: No acute abnormality. PANCREAS: No acute abnormality. ADRENAL GLANDS: No acute abnormality. KIDNEYS, URETERS AND BLADDER: Small benign bilateral renal cysts, the largest with simple fluid density (no follow up imaging recommended). No stones in the kidneys or ureters. No hydronephrosis. No perinephric or periureteral stranding. Urinary bladder is unremarkable. GI AND BOWEL: Mostly decompressed stomach. Redundant sigmoid colon. Nondilated bowel loops. Normal gas containing appendix on series 3 image 101. There is no bowel obstruction. REPRODUCTIVE ORGANS: No acute abnormality. PERITONEUM AND RETROPERITONEUM: No  ascites. No free air. VASCULATURE: Aorta is normal in caliber. Stable mild tortuosity of the abdominal aorta and iliac arteries. Major arterial structures in the abdomen and pelvis are patent. On the delayed phase images portal venous system is grossly patent. ABDOMINAL AND PELVIS LYMPH NODES: No lymphadenopathy. BONES AND SOFT TISSUES: Intermittent bulky thoracic spine posterior element degeneration is chronic. Normal thoracic and lumbar segmentation. Stable vertebral height. Degenerative appearing ankylosis of the SI joints. Partially visible chronic left femur ORIF retained metal foreign body near the left greater trochanter. No acute osseous abnormality. No superficial soft tissue injury identified. No focal soft tissue abnormality. No acute traumatic injury identified in the visualized osseous structures and soft tissues. IMPRESSION: 1. No acute traumatic injury identified in the chest, abdomen, and pelvis. Electronically signed by: Helayne Hurst MD 06/06/2024 11:15 AM EST RP Workstation: [id number]   CT Cervical Spine Wo Contrast Result Date: 06/06/2024 EXAM: CT CERVICAL SPINE WITHOUT CONTRAST 06/06/2024 11:00:50 AM TECHNIQUE: CT of the cervical spine was performed without the administration of intravenous contrast. Multiplanar reformatted images are provided for review. Automated exposure control, iterative reconstruction, and/or weight based adjustment of the mA/kV was utilized to reduce the radiation dose to as low as reasonably achievable. COMPARISON: None available. CLINICAL HISTORY: 63 year old male status post fall on blood thinners. FINDINGS: CERVICAL SPINE: BONES AND ALIGNMENT: Relatively normal cervical lordosis. Normal bone mineralization for age. Mild dextroconvex cervical scoliosis. No acute fracture or traumatic malalignment. DEGENERATIVE CHANGES: Advanced chronic cervical facet degeneration on the left at C2-C3. Chronic lower cervical disc and endplate degeneration. No definite spinal stenosis  by CT. SOFT TISSUES: No prevertebral soft tissue swelling. Mild for age calcified cervical carotid artery atherosclerosis. Negative visible  non-contrast thoracic inlet. IMPRESSION: 1. No acute traumatic injury identified in the cervical spine. 2. Chronic cervical spine degeneration. Electronically signed by: Helayne Hurst MD 06/06/2024 11:09 AM EST RP Workstation: [id number]   CT Head Wo Contrast Result Date: 06/06/2024 EXAM: CT HEAD WITHOUT CONTRAST 06/06/2024 11:00:50 AM TECHNIQUE: CT of the head was performed without the administration of intravenous contrast. Automated exposure control, iterative reconstruction, and/or weight based adjustment of the mA/kV was utilized to reduce the radiation dose to as low as reasonably achievable. COMPARISON: None available. CLINICAL HISTORY: 63 year old male status post fall on blood thinners. FINDINGS: BRAIN AND VENTRICLES: No acute hemorrhage. No evidence of acute infarct. No hydrocephalus. No extra-axial collection. No mass effect or midline shift. Brain volume is within normal limits for age. Calcified atherosclerosis at the skull base. Dural calcifications. Gray white differentiation is within normal limits for age. No suspicious intracranial vascular hyperdensity. ORBITS: No acute abnormality. SINUSES: Paranasal sinuses, middle ears and mastoids are well aerated. SOFT TISSUES AND SKULL: No acute soft tissue abnormality. No skull fracture. IMPRESSION: 1. No acute traumatic injury identified. 2. Normal for age non-contrast head CT. Electronically signed by: Helayne Hurst MD 06/06/2024 11:07 AM EST RP Workstation: [id number]   DG Pelvis Portable Result Date: 06/06/2024 EXAM: 1 or 2 VIEW(S) XRAY OF THE PELVIS 06/06/2024 10:01:00 AM COMPARISON: None available. CLINICAL HISTORY: pain pain FINDINGS: BONES AND JOINTS: No acute fracture. No focal osseous lesion. No joint dislocation. SOFT TISSUES: The soft tissues are unremarkable. IMPRESSION: 1. No significant abnormality.  Electronically signed by: Lynwood Seip MD 06/06/2024 10:23 AM EST RP Workstation: [id number]   DG Chest Portable 1 View Result Date: 06/06/2024 EXAM: 1 VIEW(S) XRAY OF THE CHEST 06/06/2024 10:01:00 AM COMPARISON: 05/01/2024 CLINICAL HISTORY: pain pain FINDINGS: LUNGS AND PLEURA: No focal pulmonary opacity. No pulmonary edema. No pleural effusion. No pneumothorax. HEART AND MEDIASTINUM: Stable cardiomediastinal silhouette. BONES AND SOFT TISSUES: No acute osseous abnormality. IMPRESSION: 1. No acute cardiopulmonary process. Electronically signed by: Lynwood Seip MD 06/06/2024 10:20 AM EST RP Workstation: [id number]     Labs:   Basic Metabolic Panel: Recent Labs  Lab 06/06/24 0955 06/06/24 1100 06/07/24 0156 06/07/24 0613  NA 143 139 139  --   K 4.3 4.2 3.7  --   CL 106 107 106  --   CO2  --  24 23  --   GLUCOSE 98 96 111*  --   BUN 17 13 13   --   CREATININE 0.90 0.99 1.09  --   CALCIUM   --  8.5* 8.8*  --   MG  --   --   --  2.1   GFR Estimated Creatinine Clearance: 100.9 mL/min (by C-G formula based on SCr of 1.09 mg/dL). Liver Function Tests: Recent Labs  Lab 06/06/24 1100 06/07/24 0156  AST 26 23  ALT 21 23  ALKPHOS 76 74  BILITOT 1.0 0.5  PROT 7.8 7.0  ALBUMIN 3.6 3.2*   Recent Labs  Lab 06/06/24 0954  LIPASE 23   No results for input(s): AMMONIA in the last 168 hours. Coagulation profile No results for input(s): INR, PROTIME in the last 168 hours.  CBC: Recent Labs  Lab 06/06/24 0955 06/06/24 1100  WBC  --  6.1  NEUTROABS  --  3.4  HGB 17.3* 15.8  HCT 51.0 48.3  MCV  --  84.6  PLT  --  224   Cardiac Enzymes: No results for input(s): CKTOTAL, CKMB, CKMBINDEX, TROPONINI in the last 168 hours. BNP:  Invalid input(s): POCBNP CBG: No results for input(s): GLUCAP in the last 168 hours. D-Dimer No results for input(s): DDIMER in the last 72 hours. Hgb A1c No results for input(s): HGBA1C in the last 72 hours. Lipid Profile No  results for input(s): CHOL, HDL, LDLCALC, TRIG, CHOLHDL, LDLDIRECT in the last 72 hours. Thyroid function studies No results for input(s): TSH, T4TOTAL, T3FREE, THYROIDAB in the last 72 hours.  Invalid input(s): FREET3 Anemia work up No results for input(s): VITAMINB12, FOLATE, FERRITIN, TIBC, IRON, RETICCTPCT in the last 72 hours. Microbiology No results found for this or any previous visit (from the past 240 hours).   Discharge Instructions:   Discharge Instructions     Ambulatory referral to Ophthalmology   Complete by: As directed    Diet - low sodium heart healthy   Complete by: As directed    Discharge instructions   Complete by: As directed    TAKE YOUR MEDICATIONS AS PRESCRIBED Need close follow up with cardiology and your PCP   Increase activity slowly   Complete by: As directed       Allergies as of 06/07/2024   No Known Allergies      Medication List     PAUSE taking these medications    metoprolol  succinate 25 MG 24 hr tablet Wait to take this until your doctor or other care provider tells you to start again. Commonly known as: TOPROL -XL Take 0.5 tablets (12.5 mg total) by mouth daily.       STOP taking these medications    cyclobenzaprine 10 MG tablet Commonly known as: FLEXERIL   methocarbamol  500 MG tablet Commonly known as: ROBAXIN        TAKE these medications    apixaban  5 MG Tabs tablet Commonly known as: ELIQUIS  Take 1 tablet (5 mg total) by mouth 2 (two) times daily.   empagliflozin  10 MG Tabs tablet Commonly known as: JARDIANCE  Take 1 tablet (10 mg total) by mouth daily.   furosemide  40 MG tablet Commonly known as: LASIX  Take 1 tablet (40 mg total) by mouth 2 (two) times daily.   HYDROcodone -acetaminophen  5-325 MG tablet Commonly known as: NORCO/VICODIN Take 1 tablet by mouth every 6 (six) hours as needed.   latanoprost  0.005 % ophthalmic solution Commonly known as: XALATAN  Place 1 drop  into both eyes at bedtime.   lidocaine  5 % Commonly known as: Lidoderm  Place 1 patch onto the skin daily. Remove & Discard patch within 12 hours or as directed by MD What changed:  when to take this reasons to take this   losartan  25 MG tablet Commonly known as: COZAAR  Take 1 tablet (25 mg total) by mouth daily.        Follow-up Information     [facility]. Schedule an appointment as soon as possible for a visit on 06/13/2024.   Why: You are scheduled for a hospital follow up on June 13, 2024 at 1:20 pm.  Please arrive 15 minutes before your appointment to complete new patient paperwork. Contact information: [address] [place]  [address] [phone]                 Time coordinating discharge: 45 min  Signed:  Harlene RAYMOND Bowl DO  [facility] 06/07/2024, 12:52 PM

## 2024-06-07 NOTE — Evaluation (Signed)
 Physical Therapy Evaluation Patient Details Name: Riley Jefferson MRN: [id number] DOB: December 31, 1960 Today's Date: 06/07/2024  History of Present Illness  Patient is a 63 y/o male admitted 06/06/24 with fall down stairs post syncopal episode.  X-rays negative for acute abnormality.  Patient reports not able to take medications since locked in his car (can't find keys). Also reports recent loss of vision R eye past 6 months. PMH positive for HTN, A-fib, anemia.  Clinical Impression  Patient presents with decreased mobility due to decreased balance, decreased safety awareness and recent falls x 3 (one was syncope as noted above).  Patient relates typically independent at home without devices.  Lives with his sister who works during the day.  Patient needing S to CGA for mobility and with LOB on stairs using rail to correct and CGA.  Patient will benefit from skilled PT in the acute setting to progress mobility and safety for d/c home with HHPT follow up.         If plan is discharge home, recommend the following: Help with stairs or ramp for entrance;Assist for transportation   Can travel by private vehicle        Equipment Recommendations None recommended by PT  Recommendations for Other Services       Functional Status Assessment Patient has had a recent decline in their functional status and demonstrates the ability to make significant improvements in function in a reasonable and predictable amount of time.     Precautions / Restrictions Precautions Precautions: Fall Recall of Precautions/Restrictions: Intact Precaution/Restrictions Comments: R vision loss      Mobility  Bed Mobility Overal bed mobility: Needs Assistance, Modified Independent             General bed mobility comments: increased time, declining help    Transfers Overall transfer level: Needs assistance   Transfers: Sit to/from Stand Sit to Stand: From elevated surface, Supervision           General  transfer comment: effortful with UE support needed    Ambulation/Gait Ambulation/Gait assistance: Supervision, Contact guard assist Gait Distance (Feet): 250 Feet Assistive device: None Gait Pattern/deviations: Step-through pattern, Decreased stride length, Shuffle, Wide base of support       General Gait Details: scuffing feet wearing house slippers, initially donned with heels out, fixed with cues; assist for occasional veering to L side with head turns, able to speed up though no change in speed for slowing, turn and stop without LOB and within <3 sec  Stairs Stairs: Yes Stairs assistance: Contact guard assist, Supervision Stair Management: One rail Right, Two rails, Alternating pattern, Forwards Number of Stairs: 10 General stair comments: assist for safety pt holding rail, one episode of toe catching on stair and self recovery with rail though PT assist for safety  Wheelchair Mobility     Tilt Bed    Modified Rankin (Stroke Patients Only)       Balance Overall balance assessment: Needs assistance   Sitting balance-Leahy Scale: Good       Standing balance-Leahy Scale: Good                               Pertinent Vitals/Pain Pain Assessment Pain Assessment: 0-10 Pain Score: 7  Pain Location: R shoulder Pain Descriptors / Indicators: Aching, Sore Pain Intervention(s): Monitored during session    Home Living Family/patient expects to be discharged to:: Private residence Living Arrangements: Other relatives (sister)   Type  of Home:  (townhouse) Home Access: Stairs to enter Entrance Stairs-Rails: Right;Left;Can reach both Entrance Stairs-Number of Steps: 8 Alternate Level Stairs-Number of Steps: flight Home Layout: Two level;Bed/bath upstairs;Full bath on main level Home Equipment: Cane - single point;Toilet riser      Prior Function Prior Level of Function : Independent/Modified Independent;Driving;History of Falls (last six months)              Mobility Comments: fell at daughters house, fell walking in the woods       Extremity/Trunk Assessment   Upper Extremity Assessment Upper Extremity Assessment: Defer to OT evaluation    Lower Extremity Assessment Lower Extremity Assessment: Overall WFL for tasks assessed       Communication   Communication Communication: No apparent difficulties    Cognition Arousal: Alert Behavior During Therapy: WFL for tasks assessed/performed                             Following commands: Intact       Cueing Cueing Techniques: Verbal cues     General Comments General comments (skin integrity, edema, etc.): VSS though pt with some diaphoresis after ambulation HR max 128 on stairs a-fib; pt reported spinning when laying down prior to fall; noted in R hall pike some slow intermittent short lasting R rotary nystagmus; treated with Eply x 1    Exercises     Assessment/Plan    PT Assessment Patient needs continued PT services  PT Problem List Decreased balance;Decreased mobility;Decreased safety awareness;Decreased activity tolerance;Other (comment) (dizziness)       PT Treatment Interventions DME instruction;Gait training;Stair training;Functional mobility training;Therapeutic activities;Therapeutic exercise;Balance training;Patient/family education    PT Goals (Current goals can be found in the Care Plan section)  Acute Rehab PT Goals Patient Stated Goal: to get home and find his keys PT Goal Formulation: With patient Time For Goal Achievement: 06/21/24 Potential to Achieve Goals: Good    Frequency Min 3X/week     Co-evaluation               AM-PAC PT 6 Clicks Mobility  Outcome Measure Help needed turning from your back to your side while in a flat bed without using bedrails?: None Help needed moving from lying on your back to sitting on the side of a flat bed without using bedrails?: None Help needed moving to and from a bed to a chair  (including a wheelchair)?: None Help needed standing up from a chair using your arms (e.g., wheelchair or bedside chair)?: None Help needed to walk in hospital room?: A Little Help needed climbing 3-5 steps with a railing? : A Little 6 Click Score: 22    End of Session Equipment Utilized During Treatment: Gait belt Activity Tolerance: Patient tolerated treatment well Patient left: in chair;with call bell/phone within reach   PT Visit Diagnosis: Other abnormalities of gait and mobility (R26.89);Repeated falls (R29.6);Other symptoms and signs involving the nervous system (R29.898);Pain Pain - Right/Left: Right Pain - part of body: Shoulder    Time: 1020-1038 PT Time Calculation (min) (ACUTE ONLY): 18 min   Charges:   PT Evaluation $PT Eval Moderate Complexity: 1 Mod   PT General Charges $$ ACUTE PT VISIT: 1 Visit         Riley Jefferson, PT Acute Rehabilitation Services Office:[phone] 06/07/2024   Riley Jefferson 06/07/2024, 11:17 AM

## 2024-06-07 NOTE — Progress Notes (Addendum)
 Transition of Care [facility]) - Inpatient Brief Assessment   Patient Details  Name: Riley Jefferson MRN: [id number] Date of Birth: [birth year]  [facility] of [facility]) CM/SW Contact:    Rosaline JONELLE Joe, RN Phone Number: 06/07/2024, 9:29 AM   Clinical Narrative: CM met with the patient at the bedside to discuss IP Care management needs and PCP follow up.  The patient admitted for syncope and collapse.  Patient lives with sister at the home and is currently pending PT/OT evaluation.  Patient reports no DME at the home.  Patient was agreeable to have PCP scheduled -  appointment was made with [facility] and noted in the AVS.  Patient was offered Medicare choice regarding home health and patient did not have a preference.  I called [facility] and Artavia accepted for [facility] services for RN, PT.  HH orders placed by MD.     Transition of Care Asessment: Insurance and Status: (P) Insurance coverage has been reviewed Patient has primary care physician: (P) No (Appointment was scheduled for hospital follow up) Home environment has been reviewed: (P) from home with sister Prior level of function:: (P) self Prior/Current Home Services: (P) No current home services Social Drivers of Health Review: (P) SDOH reviewed needs interventions Readmission risk has been reviewed: (P) Yes Transition of care needs: (P) transition of care needs identified, TOC will continue to follow

## 2024-06-07 NOTE — Progress Notes (Signed)
 Lead Transport contacted to transport patient to Discharge lounge to wait for transportation home.

## 2024-06-07 NOTE — Care Management Obs Status (Signed)
 MEDICARE OBSERVATION STATUS NOTIFICATION   Patient Details  Name: Riley Jefferson MRN: [id number] Date of Birth: 23-Feb-1961   Medicare Observation Status Notification Given:  Yes  Obs notice signed and copy given.   Riley Jefferson 06/07/2024, 12:23 PM

## 2024-06-08 ENCOUNTER — Telehealth: Payer: Self-pay | Admitting: Internal Medicine

## 2024-06-08 NOTE — Telephone Encounter (Signed)
 Caller Marlo) is following-up on form faxed regarding patient's chronic condition and stated can reach them at [phone], option 1 for further information.

## 2024-06-13 ENCOUNTER — Telehealth: Payer: Self-pay | Admitting: *Deleted

## 2024-06-13 ENCOUNTER — Encounter: Admitting: Family

## 2024-06-13 NOTE — Progress Notes (Signed)
   This encounter was created in error - please disregard. No show

## 2024-06-13 NOTE — Telephone Encounter (Signed)
 Patient was schedule for NP appointment 06/13/2024.  Tried calling patient, LMOM to return call.

## 2024-06-13 NOTE — Telephone Encounter (Signed)
 Copied from CRM [id number]. Topic: Appointments - Scheduling Inquiry for Clinic >> Jun 13, 2024  3:42 PM Farrel B wrote: Reason for CRM: [phone] (H) Mr. Riley Jefferson is requesting a callback in regard to a hospital f/u appt. The patient stated  the appt was supposed to be on Friday the 20th, I advised the patient the appt was for today, he stated someone called in to inform him the appt was on Friday. Please call pt back to advise him of when he was scheduled for the appt.

## 2024-06-14 NOTE — Telephone Encounter (Signed)
 Spoke with patient over the phone in regards to New patient appointment that it was on the Tuesday November 18. Patient was aware and has reschedule on Monday June 19, 2024 at 3:00 pm.

## 2024-06-19 ENCOUNTER — Ambulatory Visit: Admitting: Family

## 2024-06-19 ENCOUNTER — Encounter: Payer: Self-pay | Admitting: Family

## 2024-06-19 VITALS — BP 138/86 | HR 76 | Temp 97.9°F | Resp 20 | Ht 76.0 in | Wt 292.6 lb

## 2024-06-19 DIAGNOSIS — G8929 Other chronic pain: Secondary | ICD-10-CM

## 2024-06-19 DIAGNOSIS — H5461 Unqualified visual loss, right eye, normal vision left eye: Secondary | ICD-10-CM

## 2024-06-19 DIAGNOSIS — M25512 Pain in left shoulder: Secondary | ICD-10-CM

## 2024-06-19 DIAGNOSIS — R42 Dizziness and giddiness: Secondary | ICD-10-CM

## 2024-06-19 DIAGNOSIS — L602 Onychogryphosis: Secondary | ICD-10-CM

## 2024-06-19 DIAGNOSIS — I10 Essential (primary) hypertension: Secondary | ICD-10-CM | POA: Diagnosis not present

## 2024-06-19 DIAGNOSIS — R7303 Prediabetes: Secondary | ICD-10-CM

## 2024-06-19 DIAGNOSIS — H6122 Impacted cerumen, left ear: Secondary | ICD-10-CM

## 2024-06-19 DIAGNOSIS — I4821 Permanent atrial fibrillation: Secondary | ICD-10-CM | POA: Diagnosis not present

## 2024-06-19 DIAGNOSIS — M25511 Pain in right shoulder: Secondary | ICD-10-CM

## 2024-06-19 DIAGNOSIS — I5022 Chronic systolic (congestive) heart failure: Secondary | ICD-10-CM

## 2024-06-19 MED ORDER — DEBROX 6.5 % OT SOLN: 5.0000 [drp] | Freq: Two times a day (BID) | OTIC | 0 refills | Status: DC

## 2024-06-19 MED ORDER — LATANOPROST 0.005 % OP SOLN: 1.0000 [drp] | Freq: Every day | OPHTHALMIC | 3 refills | Status: AC

## 2024-06-19 MED ORDER — LIDOCAINE 5 % EX PTCH: 1.0000 | MEDICATED_PATCH | CUTANEOUS | 3 refills | Status: AC

## 2024-06-20 ENCOUNTER — Other Ambulatory Visit

## 2024-06-20 ENCOUNTER — Other Ambulatory Visit: Payer: Self-pay

## 2024-06-20 DIAGNOSIS — I10 Essential (primary) hypertension: Secondary | ICD-10-CM

## 2024-06-20 DIAGNOSIS — I4821 Permanent atrial fibrillation: Secondary | ICD-10-CM

## 2024-06-20 LAB — LIPID PANEL
Cholesterol: 146 mg/dL (ref <200)
HDL: 50 mg/dL (ref >=40)
LDL Cholesterol (Calc): 80 mg/dL
Non-HDL Cholesterol (Calc): 96 mg/dL (ref <130)
Total CHOL/HDL Ratio: 2.9 (calc) (ref <5.0)
Triglycerides: 81 mg/dL (ref <150)

## 2024-06-20 LAB — COMPLETE METABOLIC PANEL WITHOUT GFR
AG Ratio: 1.2 (calc) (ref 1.0–2.5)
ALT: 17 U/L (ref 9–46)
AST: 20 U/L (ref 10–35)
Albumin: 4 g/dL (ref 3.6–5.1)
Alkaline phosphatase (APISO): 86 U/L (ref 35–144)
BUN: 22 mg/dL (ref 7–25)
CO2: 27 mmol/L (ref 20–32)
Calcium: 9.7 mg/dL (ref 8.6–10.3)
Chloride: 104 mmol/L (ref 98–110)
Creat: 1.11 mg/dL (ref 0.70–1.35)
Globulin: 3.4 g/dL (ref 1.9–3.7)
Glucose, Bld: 91 mg/dL (ref 65–139)
Potassium: 4.1 mmol/L (ref 3.5–5.3)
Sodium: 140 mmol/L (ref 135–146)
Total Bilirubin: 0.4 mg/dL (ref 0.2–1.2)
Total Protein: 7.4 g/dL (ref 6.1–8.1)

## 2024-06-20 LAB — TSH: TSH: 0.58 m[IU]/L (ref 0.40–4.50)

## 2024-06-21 ENCOUNTER — Telehealth: Payer: Self-pay

## 2024-06-21 ENCOUNTER — Ambulatory Visit: Payer: Self-pay | Admitting: Family

## 2024-06-21 NOTE — Telephone Encounter (Signed)
 Incoming fax received from patients pharmacy to initiate a prior authorization for Lidocaine  5 % patch                  .  PA initiated through covermymeds. Key: [id number]  Awaiting reply from the insurance company which will be determined in 24-72 hours.

## 2024-06-25 DIAGNOSIS — I5022 Chronic systolic (congestive) heart failure: Secondary | ICD-10-CM | POA: Insufficient documentation

## 2024-06-25 NOTE — Progress Notes (Signed)
 Provider: Roxan Plough FNP-C   Cassell Voorhies, Roxan BROCKS, NP  Patient Care Team: Jini Horiuchi, Roxan BROCKS, NP as PCP - General (Family Medicine) Thukkani, Arun K, MD as PCP - Cardiology (Cardiology)  Extended Emergency Contact Information Primary Emergency Contact: Cheatwood,Ebonee Mobile Phone: [phone] Relation: Daughter Secondary Emergency Contact: WILBURN, KEIR Home Phone: [phone] Relation: None  Code Status:  Full Code  Goals of care: Advanced Directive information    06/19/2024    3:22 PM  Advanced Directives  Does Patient Have a Medical Advance Directive? No  Would patient like information on creating a medical advance directive? No - Patient declined     Chief Complaint  Patient presents with   Establish Care    New patient appointment.    Discussed the use of AI scribe software for clinical note transcription with the patient, who gave verbal consent to proceed.  History of Present Illness   Riley Jefferson is a 63 year old male with hypertension and atrial fibrillation who presents with dizziness and balance issues.  He has been experiencing dizziness and balance issues for the past week, characterized by a sensation of vertigo, frequent stumbling, and difficulty maintaining balance, especially when standing still, walking, or bending over. No prior episodes of these symptoms have been noted. He has not been monitoring his blood pressure at home.  His medical history includes hypertension, atrial fibrillation, osteoarthritis, sensory hearing loss, thrombocytopenia, congestive heart failure, and type 2 diabetes. He is currently taking Eliquis  5 mg twice daily, Junel 10 mg daily, Furosemide  40 mg twice daily, Latanoprost  eye drops once daily, and Losartan  25 mg daily. Metoprolol  was paused about three months ago. He has experienced syncope in the past, with a notable episode on June 06, 2024, leading to a fall and right shoulder pain.  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 He also has a history of thrombocytopenia diagnosed in 2007 or 2008, but denies any recent issues with bruising or bleeding.  He has a history of congestive heart failure and reports occasional leg swelling, which has decreased with the use of Furosemide . He also has a history of type 2 diabetes, with an A1c of 6.0% in August 2025. He is not currently on any diabetes medication and was unaware of this diagnosis until recently.  He has a history of smoking, having smoked half a pack a day from 1978 until quitting in 2006. He denies any current use of alcohol or recreational drugs. He reports urinary frequency, which he attributes to his fluid medication, and denies constipation.  Family history includes a mother with heart disease and a father with a history of stroke and hypertension. He has a sister with diabetes and a grandfather who was diabetic.  Past Medical History:  Diagnosis Date   Atrial fibrillation (HCC)    CHF (congestive heart failure) (HCC)    Hypertension    Prediabetes    Past Surgical History:  Procedure Laterality Date   Bar in arm     Bar in left thigh     binign tumor right ear     screws in knee      No Known Allergies  Allergies as of 06/19/2024   No Known Allergies      Medication List        Accurate as of June 19, 2024 11:59 PM. If you have any questions, ask  your nurse or doctor.          PAUSE taking these medications    metoprolol  succinate 25 MG 24 hr tablet Wait to take this until your doctor or other care provider tells you to start again. Commonly known as: TOPROL -XL Take 0.5 tablets (12.5 mg total) by mouth daily.       STOP taking these medications    HYDROcodone -acetaminophen  5-325 MG tablet Commonly known as: NORCO/VICODIN Stopped by: Zahki Hoogendoorn C Rozetta Stumpp       TAKE these medications    Debrox 6.5 % OTIC  solution Generic drug: carbamide peroxide Place 5 drops into the left ear 2 (two) times daily. Started by: Miquan Tandon C Kaedyn Polivka   Eliquis  5 MG Tabs tablet Generic drug: apixaban  Take 1 tablet (5 mg total) by mouth 2 (two) times daily.   furosemide  40 MG tablet Commonly known as: LASIX  Take 1 tablet (40 mg total) by mouth 2 (two) times daily.   Jardiance  10 MG Tabs tablet Generic drug: empagliflozin  Take 1 tablet (10 mg total) by mouth daily.   latanoprost  0.005 % ophthalmic solution Commonly known as: XALATAN  Place 1 drop into both eyes at bedtime.   lidocaine  5 % Commonly known as: Lidoderm  Place 1 patch onto the skin daily. Remove & Discard patch within 12 hours or as directed by MD   losartan  25 MG tablet Commonly known as: COZAAR  Take 1 tablet (25 mg total) by mouth daily.        Review of Systems  Constitutional:  Negative for appetite change, chills, fatigue, fever and unexpected weight change.  HENT:  Negative for congestion, dental problem, ear discharge, ear pain, hearing loss, nosebleeds, postnasal drip, rhinorrhea, sinus pressure, sinus pain, sneezing, sore throat, tinnitus and trouble swallowing.   Eyes:  Negative for pain, discharge, redness, itching and visual disturbance.  Respiratory:  Negative for cough, chest tightness, shortness of breath and wheezing.   Cardiovascular:  Negative for chest pain, palpitations and leg swelling.  Gastrointestinal:  Negative for abdominal distention, abdominal pain, blood in stool, constipation, diarrhea, nausea and vomiting.  Endocrine: Negative for cold intolerance, heat intolerance, polydipsia, polyphagia and polyuria.  Genitourinary:  Negative for difficulty urinating, dysuria, flank pain, frequency and urgency.  Musculoskeletal:  Negative for arthralgias, back pain, gait problem, joint swelling, myalgias, neck pain and neck stiffness.  Skin:  Negative for color change, pallor, rash and wound.  Neurological:  Positive for  dizziness. Negative for syncope, speech difficulty, weakness, light-headedness, numbness and headaches.  Hematological:  Does not bruise/bleed easily.  Psychiatric/Behavioral:  Negative for agitation, behavioral problems, confusion, hallucinations, self-injury, sleep disturbance and suicidal ideas. The patient is not nervous/anxious.      There is no immunization history on file for this patient. Pertinent  Health Maintenance Due  Topic Date Due   Colonoscopy  Never done   Influenza Vaccine  Never done      06/08/2022    5:28 PM 06/09/2022    5:13 AM 06/09/2022    5:56 PM 06/09/2022   10:15 PM 06/19/2024    3:21 PM  Fall Risk  Falls in the past year?     1  Was there an injury with Fall?     0  Fall Risk Category Calculator     2  (RETIRED) Patient Fall Risk Level Low fall risk  Low fall risk  Low fall risk  Moderate fall risk    Patient at Risk for Falls Due to     History of  fall(s)  Fall risk Follow up     Falls evaluation completed     Data saved with a previous flowsheet row definition   Functional Status Survey:    Vitals:   06/19/24 1515  BP: 138/86  Pulse: 76  Resp: 20  Temp: 97.9 F (36.6 C)  SpO2: 97%  Weight: 292 lb 9.6 oz (132.7 kg)  Height: 6' 4 (1.93 m)   Body mass index is 35.62 kg/m. Physical Exam  VITALS: P- 76, BP- 138/86, SaO2- 97% MEASUREMENTS: Height- 6'4, Weight- 292. GENERAL: Alert, cooperative, well developed, no acute distress. HEENT: Normocephalic, normal oropharynx, moist mucous membranes, left ear canal obstructed with cerumen, no sinus tenderness. CHEST: Clear to auscultation bilaterally, no wheezes, rhonchi, or crackles, no chest wall tenderness. CARDIOVASCULAR: Irregular heart rhythm, S1 and S2 normal without murmurs. ABDOMEN: Soft, non-tender, non-distended, without organomegaly, normal bowel sounds. EXTREMITIES: No cyanosis or edema, extremities with dry skin. NEUROLOGICAL: Cranial nerves grossly intact, moves all extremities  without gross motor or sensory deficit.  SKIN: No rash,no lesion or erythema   PSYCHIATRY/BEHAVIORAL: Mood stable   Labs reviewed: Recent Labs    06/06/24 1100 06/07/24 0156 06/07/24 0613 06/20/24 1010  NA 139 139  --  140  K 4.2 3.7  --  4.1  CL 107 106  --  104  CO2 24 23  --  27  GLUCOSE 96 111*  --  91  BUN 13 13  --  22  CREATININE 0.99 1.09  --  1.11  CALCIUM  8.5* 8.8*  --  9.7  MG  --   --  2.1  --    Recent Labs    04/07/24 0520 06/06/24 1100 06/07/24 0156 06/20/24 1010  AST 32 26 23 20   ALT 27 21 23 17   ALKPHOS 88 76 74  --   BILITOT 1.0 1.0 0.5 0.4  PROT 7.7 7.8 7.0 7.4  ALBUMIN 3.5 3.6 3.2*  --    Recent Labs    07/13/23 1541 11/24/23 1857 02/25/24 0334 04/06/24 2207 04/07/24 0552 04/29/24 0041 06/06/24 0955 06/06/24 1100  WBC 5.8   < > 7.0 7.1  --  7.2  --  6.1  NEUTROABS 2.5  --  3.4  --   --   --   --  3.4  HGB 15.4   < > 15.5 15.3   < > 15.0 17.3* 15.8  HCT 47.3   < > 46.3 46.1   < > 46.0 51.0 48.3  MCV 84.9   < > 83.3 85.2  --  83.8  --  84.6  PLT 213   < > 217 234  --  232  --  224   < > = values in this interval not displayed.   Lab Results  Component Value Date   TSH 0.58 06/20/2024   Lab Results  Component Value Date   HGBA1C 6.0 (H) 03/15/2024   Lab Results  Component Value Date   CHOL 146 06/20/2024   HDL 50 06/20/2024   LDLCALC 80 06/20/2024   TRIG 81 06/20/2024   CHOLHDL 2.9 06/20/2024    Significant Diagnostic Results in last 30 days:  ECHOCARDIOGRAM COMPLETE Result Date: 06/06/2024    ECHOCARDIOGRAM REPORT   Patient Name:   Atif Chapple Date of Exam: 06/06/2024 Medical Rec #:  [id number]      Height:       76.0 in Accession #:    [id number]     Weight:  295.0 lb Date of Birth:  24-Dec-1960       BSA:          2.613 m Patient Age:    63 years       BP:           183/104 mmHg Patient Gender: M              HR:           80 bpm. Exam Location:  Inpatient Procedure: 2D Echo, Cardiac Doppler and Color Doppler (Both  Spectral and Color            Flow Doppler were utilized during procedure). Indications:    Syncope  History:        Patient has prior history of Echocardiogram examinations, most                 recent 06/10/2022. Arrythmias:Atrial Fibrillation and                 Bradycardia, Signs/Symptoms:Syncope and                 Dizziness/Lightheadedness; Risk Factors:Hypertension and Former                 Smoker.  Sonographer:    Juliene Rucks Referring Phys: MARSA KATHEE SCURRY  Sonographer Comments: Suboptimal subcostal window and patient is obese. IMPRESSIONS  1. Left ventricular ejection fraction, by estimation, is 50 to 55%. The left ventricle has low normal function. The left ventricle has no regional wall motion abnormalities. There is moderate concentric left ventricular hypertrophy. Left ventricular diastolic function could not be evaluated.  2. Right ventricular systolic function is normal. The right ventricular size is normal.  3. Left atrial size was severely dilated.  4. The mitral valve is normal in structure. No evidence of mitral valve regurgitation. No evidence of mitral stenosis.  5. The aortic valve is tricuspid. Aortic valve regurgitation is not visualized. No aortic stenosis is present. Comparison(s): The left ventricular function has improved. FINDINGS  Left Ventricle: Left ventricular ejection fraction, by estimation, is 50 to 55%. The left ventricle has low normal function. The left ventricle has no regional wall motion abnormalities. The left ventricular internal cavity size was normal in size. There is moderate concentric left ventricular hypertrophy. Left ventricular diastolic function could not be evaluated due to atrial fibrillation. Left ventricular diastolic function could not be evaluated. Right Ventricle: The right ventricular size is normal. Right vetricular wall thickness was not well visualized. Right ventricular systolic function is normal. Left Atrium: Left atrial size was severely  dilated. Right Atrium: Right atrial size was normal in size. Pericardium: There is no evidence of pericardial effusion. Mitral Valve: The mitral valve is normal in structure. No evidence of mitral valve regurgitation. No evidence of mitral valve stenosis. Tricuspid Valve: The tricuspid valve is normal in structure. Tricuspid valve regurgitation is not demonstrated. Aortic Valve: The aortic valve is tricuspid. Aortic valve regurgitation is not visualized. No aortic stenosis is present. Pulmonic Valve: The pulmonic valve was not well visualized. Pulmonic valve regurgitation is not visualized. No evidence of pulmonic stenosis. Aorta: The aortic root is normal in size and structure and the ascending aorta was not well visualized. Venous: The inferior vena cava was not well visualized. IAS/Shunts: The interatrial septum was not well visualized.  LEFT VENTRICLE PLAX 2D LVIDd:         4.70 cm   Diastology LVIDs:         3.50  cm   LV e' medial:  6.20 cm/s LV PW:         1.40 cm   LV e' lateral: 9.57 cm/s LV IVS:        1.50 cm LVOT diam:     2.00 cm LV SV:         48 LV SV Index:   18 LVOT Area:     3.14 cm  RIGHT VENTRICLE RV Basal diam:  3.70 cm RV Mid diam:    2.80 cm RV S prime:     17.30 cm/s LEFT ATRIUM            Index        RIGHT ATRIUM           Index LA diam:      3.90 cm  1.49 cm/m   RA Area:     14.60 cm LA Vol (A4C): 102.0 ml 39.03 ml/m  RA Volume:   35.20 ml  13.47 ml/m  AORTIC VALVE LVOT Vmax:   99.60 cm/s LVOT Vmean:  60.300 cm/s LVOT VTI:    0.152 m  AORTA Ao Root diam: 3.10 cm  SHUNTS Systemic VTI:  0.15 m Systemic Diam: 2.00 cm Jerel Croitoru MD Electronically signed by Jerel Balding MD Signature Date/Time: 06/06/2024/5:45:00 PM    Final    MR BRAIN WO CONTRAST Result Date: 06/06/2024 EXAM: MRI BRAIN WITHOUT CONTRAST 06/06/2024 04:03:35 PM TECHNIQUE: Multiplanar multisequence MRI of the head/brain was performed without the administration of intravenous contrast. COMPARISON: CT head 06/06/2024  10:52 am. CLINICAL HISTORY: Neuro deficit, acute, stroke suspected. FINDINGS: Exam is mildly degraded by motion artifact. BRAIN AND VENTRICLES: No acute infarct. No intracranial hemorrhage. No mass. No midline shift. No hydrocephalus. Mild generalized parenchymal volume loss with enlargement of the ventricles, sulci/ fissures, and cisternal spaces, slightly advanced for the patient's stated age. There is a small focus of susceptibility in the left parietal lobe which may relate to the sequela of remote microhemorrhage. There is sparse subcortical and periventricular T2 and FLAIR signal hyperintensity likely reflecting sequelae of chronic microvascular ischemia. The sella is unremarkable. Normal flow voids. ORBITS: No acute abnormality. SINUSES AND MASTOIDS: No acute abnormality. BONES AND SOFT TISSUES: Normal marrow signal. No acute soft tissue abnormality. IMPRESSION: 1. No acute intracranial hemorrhage or acute infarction. Electronically signed by: prentice spade 06/06/2024 04:42 PM EST RP Workstation: [id number]   DG Humerus Right Result Date: 06/06/2024 CLINICAL DATA:  Pain after a fall. EXAM: RIGHT HUMERUS - 2+ VIEW COMPARISON:  None Available. FINDINGS: No evidence for an acute fracture in the humerus. Mild degenerative changes are seen in the acromioclavicular joint. No worrisome lytic or sclerotic osseous abnormality. IMPRESSION: 1. No acute bony findings. 2. Mild degenerative changes in the acromioclavicular joint. Electronically Signed   By: Camellia Candle M.D.   On: 06/06/2024 12:13   DG Shoulder Right Portable Result Date: 06/06/2024 CLINICAL DATA:  Shoulder pain after a fall. EXAM: RIGHT SHOULDER - 1 VIEW COMPARISON:  No comparison studies available. FINDINGS: No evidence for an acute fracture. No shoulder separation or dislocation. IMPRESSION: Negative. Electronically Signed   By: Camellia Candle M.D.   On: 06/06/2024 12:13   CT CHEST ABDOMEN PELVIS W CONTRAST Result Date: 06/06/2024 EXAM: CT  CHEST, ABDOMEN AND PELVIS WITH CONTRAST 06/06/2024 11:00:50 AM TECHNIQUE: CT of the chest, abdomen and pelvis was performed with the administration of iohexol  (OMNIPAQUE ) 350 MG/ML injection. Multiplanar reformatted images are provided for review. Automated exposure control, iterative reconstruction,  and/or weight based adjustment of the mA/kV was utilized to reduce the radiation dose to as low as reasonably achievable. COMPARISON: CT chest, abdomen, and pelvis 04/29/2024. CLINICAL HISTORY: 63 year old male status post fall on blood thinners. FINDINGS: CHEST: MEDIASTINUM AND LYMPH NODES: Heart and pericardium are unremarkable. The central airways are clear. No mediastinal, hilar or axillary lymphadenopathy. Mild for age calcified atherosclerosis of the thoracic aorta. LUNGS AND PLEURA: Mild respiratory motion stable somewhat low lung volumes and mild dependent atelectasis. Stable tiny left lower lobe subpleural lung nodule on series 4 image 102 (no follow up imaging recommended). No focal consolidation or pulmonary edema. No pleural effusion or pneumothorax. No acute traumatic injury identified in the imaged chest. ABDOMEN AND PELVIS: LIVER: The liver is unremarkable. GALLBLADDER AND BILE DUCTS: Gallbladder is unremarkable. No biliary ductal dilatation. SPLEEN: No acute abnormality. PANCREAS: No acute abnormality. ADRENAL GLANDS: No acute abnormality. KIDNEYS, URETERS AND BLADDER: Small benign bilateral renal cysts, the largest with simple fluid density (no follow up imaging recommended). No stones in the kidneys or ureters. No hydronephrosis. No perinephric or periureteral stranding. Urinary bladder is unremarkable. GI AND BOWEL: Mostly decompressed stomach. Redundant sigmoid colon. Nondilated bowel loops. Normal gas containing appendix on series 3 image 101. There is no bowel obstruction. REPRODUCTIVE ORGANS: No acute abnormality. PERITONEUM AND RETROPERITONEUM: No ascites. No free air. VASCULATURE: Aorta is  normal in caliber. Stable mild tortuosity of the abdominal aorta and iliac arteries. Major arterial structures in the abdomen and pelvis are patent. On the delayed phase images portal venous system is grossly patent. ABDOMINAL AND PELVIS LYMPH NODES: No lymphadenopathy. BONES AND SOFT TISSUES: Intermittent bulky thoracic spine posterior element degeneration is chronic. Normal thoracic and lumbar segmentation. Stable vertebral height. Degenerative appearing ankylosis of the SI joints. Partially visible chronic left femur ORIF retained metal foreign body near the left greater trochanter. No acute osseous abnormality. No superficial soft tissue injury identified. No focal soft tissue abnormality. No acute traumatic injury identified in the visualized osseous structures and soft tissues. IMPRESSION: 1. No acute traumatic injury identified in the chest, abdomen, and pelvis. Electronically signed by: Helayne Hurst MD 06/06/2024 11:15 AM EST RP Workstation: [id number]   CT Cervical Spine Wo Contrast Result Date: 06/06/2024 EXAM: CT CERVICAL SPINE WITHOUT CONTRAST 06/06/2024 11:00:50 AM TECHNIQUE: CT of the cervical spine was performed without the administration of intravenous contrast. Multiplanar reformatted images are provided for review. Automated exposure control, iterative reconstruction, and/or weight based adjustment of the mA/kV was utilized to reduce the radiation dose to as low as reasonably achievable. COMPARISON: None available. CLINICAL HISTORY: 63 year old male status post fall on blood thinners. FINDINGS: CERVICAL SPINE: BONES AND ALIGNMENT: Relatively normal cervical lordosis. Normal bone mineralization for age. Mild dextroconvex cervical scoliosis. No acute fracture or traumatic malalignment. DEGENERATIVE CHANGES: Advanced chronic cervical facet degeneration on the left at C2-C3. Chronic lower cervical disc and endplate degeneration. No definite spinal stenosis by CT. SOFT TISSUES: No prevertebral soft  tissue swelling. Mild for age calcified cervical carotid artery atherosclerosis. Negative visible non-contrast thoracic inlet. IMPRESSION: 1. No acute traumatic injury identified in the cervical spine. 2. Chronic cervical spine degeneration. Electronically signed by: Helayne Hurst MD 06/06/2024 11:09 AM EST RP Workstation: [id number]   CT Head Wo Contrast Result Date: 06/06/2024 EXAM: CT HEAD WITHOUT CONTRAST 06/06/2024 11:00:50 AM TECHNIQUE: CT of the head was performed without the administration of intravenous contrast. Automated exposure control, iterative reconstruction, and/or weight based adjustment of the mA/kV was utilized to reduce the radiation  dose to as low as reasonably achievable. COMPARISON: None available. CLINICAL HISTORY: 63 year old male status post fall on blood thinners. FINDINGS: BRAIN AND VENTRICLES: No acute hemorrhage. No evidence of acute infarct. No hydrocephalus. No extra-axial collection. No mass effect or midline shift. Brain volume is within normal limits for age. Calcified atherosclerosis at the skull base. Dural calcifications. Gray white differentiation is within normal limits for age. No suspicious intracranial vascular hyperdensity. ORBITS: No acute abnormality. SINUSES: Paranasal sinuses, middle ears and mastoids are well aerated. SOFT TISSUES AND SKULL: No acute soft tissue abnormality. No skull fracture. IMPRESSION: 1. No acute traumatic injury identified. 2. Normal for age non-contrast head CT. Electronically signed by: Helayne Hurst MD 06/06/2024 11:07 AM EST RP Workstation: [id number]   DG Pelvis Portable Result Date: 06/06/2024 EXAM: 1 or 2 VIEW(S) XRAY OF THE PELVIS 06/06/2024 10:01:00 AM COMPARISON: None available. CLINICAL HISTORY: pain pain FINDINGS: BONES AND JOINTS: No acute fracture. No focal osseous lesion. No joint dislocation. SOFT TISSUES: The soft tissues are unremarkable. IMPRESSION: 1. No significant abnormality. Electronically signed by: Lynwood Seip MD  06/06/2024 10:23 AM EST RP Workstation: [id number]   DG Chest Portable 1 View Result Date: 06/06/2024 EXAM: 1 VIEW(S) XRAY OF THE CHEST 06/06/2024 10:01:00 AM COMPARISON: 05/01/2024 CLINICAL HISTORY: pain pain FINDINGS: LUNGS AND PLEURA: No focal pulmonary opacity. No pulmonary edema. No pleural effusion. No pneumothorax. HEART AND MEDIASTINUM: Stable cardiomediastinal silhouette. BONES AND SOFT TISSUES: No acute osseous abnormality. IMPRESSION: 1. No acute cardiopulmonary process. Electronically signed by: Lynwood Seip MD 06/06/2024 10:20 AM EST RP Workstation: [id number]    Assessment/Plan  Essential hypertension Blood pressure recorded at 138/86 mmHg. No recent home monitoring reported. - Resume home blood pressure monitoring - Continue current antihypertensive medications  Permanent atrial fibrillation Irregular heartbeat with palpitations. Recent syncope and vision loss reported. MRI of the brain showed no acute intracranial hemorrhage or stroke. Echocardiogram similar to previous results. Eliquis  was paused but needs to be resumed. - Resume Eliquis  5 mg twice daily - Follow up with cardiologist for atrial fibrillation management  Chronic congestive heart failure Diagnosed since June 16, 2022. Reports shortness of breath and leg swelling, which has since scarred down. Currently on furosemide  for fluid management. - Continue furosemide  40 mg twice daily - Follow up with cardiologist for heart failure management  Dizziness and gait instability Recent onset of dizziness and gait instability, leading to falls. No prior history of similar symptoms. Recent MRI of the brain showed no acute intracranial hemorrhage or stroke. - Follow up with cardiologist to evaluate cause of syncope and dizziness  History of syncope Recent syncope episode on November 11th, 2025, associated with fall and vision loss. No further episodes reported since discharge. - Follow up with cardiologist to evaluate  cause of syncope  Unqualified visual loss, right eye Ongoing visual loss in the right eye, described as inability to see straight but can see peripherally. No recent ophthalmology evaluation reported. - Referred to ophthalmologist for urgent evaluation of right eye vision loss  Pain in right and left shoulder Chronic pain in both shoulders, with recent acute pain in the right shoulder leading to hospital admission. X-ray showed mild degenerative changes in the acromioclavicular joint. - Referred to pain management specialist for shoulder pain evaluation  Osteoarthritis (multiple joints) Osteoarthritis affecting multiple joints, including knees and shoulders. Recent exacerbations reported.  Impacted cerumen, left ear Left ear completely occluded with cerumen, preventing visualization of the eardrum. Recent self-cleaning attempts with Q-tip may have exacerbated impaction. 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 No recent podiatry evaluation reported. - Referred to podiatrist for toenail care  Prediabetes A1c of 6.0% as of August 2025. No prior diagnosis of diabetes. Advised on dietary modifications and exercise to prevent progression to diabetes. - Advised on dietary modifications to reduce carbohydrate intake - Encouraged regular exercise to manage blood sugar levels  Bilateral sensorineural hearing loss - no hering aids   General Health Maintenance Discussed importance of regular dental check-ups and skin care due to prediabetes. - Schedule dental c leaning every 6 months - Use Cetaphil cream or Aquaphor for dry skin management   Family/ staff Communication: Reviewed plan of care with patient verbalized understanding   Labs/tests ordered:  - CBC with Differential/Platelet - CMP with eGFR([facility]) -  TSH  Next Appointment : Return in about 4 months (around 10/17/2024) for medical mangement of chronic issues., Fasting labs in the morning. left ear lavage in 1 week .   Spent 45 minutes of Face to face and non-face to face with patient  >50% time spent counseling; reviewing medical record; tests; labs; documentation and developing future plan of care.   Roxan JAYSON Plough, NP

## 2024-06-27 ENCOUNTER — Encounter: Payer: Self-pay | Admitting: Family

## 2024-06-27 ENCOUNTER — Ambulatory Visit: Admitting: Family

## 2024-06-27 VITALS — BP 136/82 | HR 69 | Temp 97.7°F | Resp 20 | Ht 76.0 in | Wt 301.4 lb

## 2024-06-27 DIAGNOSIS — I10 Essential (primary) hypertension: Secondary | ICD-10-CM

## 2024-06-27 DIAGNOSIS — H6122 Impacted cerumen, left ear: Secondary | ICD-10-CM | POA: Diagnosis not present

## 2024-06-27 NOTE — Progress Notes (Signed)
 Provider: Roxan Plough FNP-C  Artice Holohan, Roxan BROCKS, NP  Patient Care Team: Carloyn Lahue, Roxan BROCKS, NP as PCP - General (Family Medicine) Thukkani, Arun K, MD as PCP - Cardiology (Cardiology)  Extended Emergency Contact Information Primary Emergency Contact: Cheatwood,Ebonee Mobile Phone: [phone] Relation: Daughter Secondary Emergency Contact: STEEL, KERNEY Home Phone: [phone] Relation: None  Code Status:  Full Code  Goals of care: Advanced Directive information    06/19/2024    3:22 PM  Advanced Directives  Does Patient Have a Medical Advance Directive? No  Would patient like information on creating a medical advance directive? No - Patient declined     Chief Complaint  Patient presents with   Cerumen Impaction    Left ear lavage.   Discussed the use of AI scribe software for clinical note transcription with the patient, who gave verbal consent to proceed.  History of Present Illness   Riley Jefferson is a 63 year old male who presents for a follow-up visit for left ear lavage.  One week ago, he was advised to use Debrox drops, five drops twice daily, to soften ear sediment impaction. However, he was unable to obtain the drops and did not use any treatment at home.  During today's visit, he reports no pain in the ear and no symptoms of upper respiratory infection such as coughing.  He attends the [employer] in [place] and [address] social interactions, especially during holidays like Thanksgiving, which he spent with family.   Past Medical History:  Diagnosis Date   Atrial fibrillation (HCC)    CHF (congestive heart failure) (HCC)    Hypertension    Prediabetes    Past Surgical History:  Procedure Laterality Date   Bar in arm     Bar in left thigh     binign tumor right ear     screws in knee      No Known Allergies  Outpatient Encounter Medications as of 06/27/2024  Medication Sig   apixaban  (ELIQUIS ) 5 MG  TABS tablet Take 1 tablet (5 mg total) by mouth 2 (two) times daily.   empagliflozin  (JARDIANCE ) 10 MG TABS tablet Take 1 tablet (10 mg total) by mouth daily.   furosemide  (LASIX ) 40 MG tablet Take 1 tablet (40 mg total) by mouth 2 (two) times daily.   lidocaine  (LIDODERM ) 5 % Place 1 patch onto the skin daily. Remove & Discard patch within 12 hours or as directed by MD   losartan  (COZAAR ) 25 MG tablet Take 1 tablet (25 mg total) by mouth daily.   carbamide peroxide (DEBROX) 6.5 % OTIC solution Place 5 drops into the left ear 2 (two) times daily. (Patient not taking: Reported on 06/27/2024)   latanoprost  (XALATAN ) 0.005 % ophthalmic solution Place 1 drop into both eyes at bedtime. (Patient not taking: Reported on 06/27/2024)   [Paused] metoprolol  succinate (TOPROL -XL) 25 MG 24 hr tablet Take 0.5 tablets (12.5 mg total) by mouth daily. (Patient not taking: Reported on 06/27/2024)   No facility-administered encounter medications on file as of 06/27/2024.    Review of Systems  Constitutional:  Negative for appetite change, chills, fatigue, fever and unexpected weight change.  HENT:  Negative for congestion, dental problem, ear discharge, ear pain, hearing loss, nosebleeds, postnasal drip, rhinorrhea, sinus pressure, sinus pain, sneezing, sore throat and tinnitus.   Eyes:  Negative for pain, discharge, redness, itching and visual disturbance.  Respiratory:  Negative for cough, chest tightness, shortness  of breath and wheezing.   Cardiovascular:  Negative for chest pain, palpitations and leg swelling.  Skin:  Negative for color change, pallor and rash.  Neurological:  Negative for dizziness, weakness, light-headedness and headaches.     There is no immunization history on file for this patient. Pertinent  Health Maintenance Due  Topic Date Due   Colonoscopy  Never done   Influenza Vaccine  Never done      06/08/2022    5:28 PM 06/09/2022    5:13 AM 06/09/2022    5:56 PM 06/09/2022   10:15 PM  06/19/2024    3:21 PM  Fall Risk  Falls in the past year?     1  Was there an injury with Fall?     0   Fall Risk Category Calculator     2  (RETIRED) Patient Fall Risk Level Low fall risk  Low fall risk  Low fall risk  Moderate fall risk    Patient at Risk for Falls Due to     History of fall(s)  Fall risk Follow up     Falls evaluation completed     Data saved with a previous flowsheet row definition   Functional Status Survey:    Vitals:   06/27/24 1434  BP: 136/82  Pulse: 69  Resp: 20  Temp: 97.7 F (36.5 C)  SpO2: 96%  Weight: (!) 301 lb 6.4 oz (136.7 kg)  Height: 6' 4 (1.93 m)   Body mass index is 36.69 kg/m. Physical Exam  GENERAL: Alert, cooperative, well developed, no acute distress. HEENT: Normocephalic, normal oropharynx, moist mucous membranes, eardrum normal, no infection. Left ear lavage with warm water and Hydrogen peroxide moderate amount of cerumen removed using curette CHEST: Clear to auscultation bilaterally, no wheezes, rhonchi, or crackles. CARDIOVASCULAR: Regular rate and rhythm, S1 and S2 normal without murmurs. NEUROLOGICAL: Cranial nerves grossly intact, moves all extremities without gross motor or sensory deficit.   Labs reviewed: Recent Labs    06/06/24 1100 06/07/24 0156 06/07/24 0613 06/20/24 1010  NA 139 139  --  140  K 4.2 3.7  --  4.1  CL 107 106  --  104  CO2 24 23  --  27  GLUCOSE 96 111*  --  91  BUN 13 13  --  22  CREATININE 0.99 1.09  --  1.11  CALCIUM  8.5* 8.8*  --  9.7  MG  --   --  2.1  --    Recent Labs    04/07/24 0520 06/06/24 1100 06/07/24 0156 06/20/24 1010  AST 32 26 23 20   ALT 27 21 23 17   ALKPHOS 88 76 74  --   BILITOT 1.0 1.0 0.5 0.4  PROT 7.7 7.8 7.0 7.4  ALBUMIN 3.5 3.6 3.2*  --    Recent Labs    07/13/23 1541 11/24/23 1857 02/25/24 0334 04/06/24 2207 04/07/24 0552 04/29/24 0041 06/06/24 0955 06/06/24 1100  WBC 5.8   < > 7.0 7.1  --  7.2  --  6.1  NEUTROABS 2.5  --  3.4  --   --   --   --   3.4  HGB 15.4   < > 15.5 15.3   < > 15.0 17.3* 15.8  HCT 47.3   < > 46.3 46.1   < > 46.0 51.0 48.3  MCV 84.9   < > 83.3 85.2  --  83.8  --  84.6  PLT 213   < > 217 234  --  232  --  224   < > = values in this interval not displayed.   Lab Results  Component Value Date   TSH 0.58 06/20/2024   Lab Results  Component Value Date   HGBA1C 6.0 (H) 03/15/2024   Lab Results  Component Value Date   CHOL 146 06/20/2024   HDL 50 06/20/2024   LDLCALC 80 06/20/2024   TRIG 81 06/20/2024   CHOLHDL 2.9 06/20/2024    Significant Diagnostic Results in last 30 days:  ECHOCARDIOGRAM COMPLETE Result Date: 06/06/2024    ECHOCARDIOGRAM REPORT   Patient Name:   Gurnoor Ursua Date of Exam: 06/06/2024 Medical Rec #:  [id number]      Height:       76.0 in Accession #:    [id number]     Weight:       295.0 lb Date of Birth:  11-02-1960       BSA:          2.613 m Patient Age:    63 years       BP:           183/104 mmHg Patient Gender: M              HR:           80 bpm. Exam Location:  Inpatient Procedure: 2D Echo, Cardiac Doppler and Color Doppler (Both Spectral and Color            Flow Doppler were utilized during procedure). Indications:    Syncope  History:        Patient has prior history of Echocardiogram examinations, most                 recent 06/10/2022. Arrythmias:Atrial Fibrillation and                 Bradycardia, Signs/Symptoms:Syncope and                 Dizziness/Lightheadedness; Risk Factors:Hypertension and Former                 Smoker.  Sonographer:    Juliene Rucks Referring Phys: MARSA KATHEE SCURRY  Sonographer Comments: Suboptimal subcostal window and patient is obese. IMPRESSIONS  1. Left ventricular ejection fraction, by estimation, is 50 to 55%. The left ventricle has low normal function. The left ventricle has no regional wall motion abnormalities. There is moderate concentric left ventricular hypertrophy. Left ventricular diastolic function could not be evaluated.  2. Right ventricular  systolic function is normal. The right ventricular size is normal.  3. Left atrial size was severely dilated.  4. The mitral valve is normal in structure. No evidence of mitral valve regurgitation. No evidence of mitral stenosis.  5. The aortic valve is tricuspid. Aortic valve regurgitation is not visualized. No aortic stenosis is present. Comparison(s): The left ventricular function has improved. FINDINGS  Left Ventricle: Left ventricular ejection fraction, by estimation, is 50 to 55%. The left ventricle has low normal function. The left ventricle has no regional wall motion abnormalities. The left ventricular internal cavity size was normal in size. There is moderate concentric left ventricular hypertrophy. Left ventricular diastolic function could not be evaluated due to atrial fibrillation. Left ventricular diastolic function could not be evaluated. Right Ventricle: The right ventricular size is normal. Right vetricular wall thickness was not well visualized. Right ventricular systolic function is normal. Left Atrium: Left atrial size was severely dilated. Right Atrium: Right atrial size was normal in size. Pericardium: There  is no evidence of pericardial effusion. Mitral Valve: The mitral valve is normal in structure. No evidence of mitral valve regurgitation. No evidence of mitral valve stenosis. Tricuspid Valve: The tricuspid valve is normal in structure. Tricuspid valve regurgitation is not demonstrated. Aortic Valve: The aortic valve is tricuspid. Aortic valve regurgitation is not visualized. No aortic stenosis is present. Pulmonic Valve: The pulmonic valve was not well visualized. Pulmonic valve regurgitation is not visualized. No evidence of pulmonic stenosis. Aorta: The aortic root is normal in size and structure and the ascending aorta was not well visualized. Venous: The inferior vena cava was not well visualized. IAS/Shunts: The interatrial septum was not well visualized.  LEFT VENTRICLE PLAX 2D  LVIDd:         4.70 cm   Diastology LVIDs:         3.50 cm   LV e' medial:  6.20 cm/s LV PW:         1.40 cm   LV e' lateral: 9.57 cm/s LV IVS:        1.50 cm LVOT diam:     2.00 cm LV SV:         48 LV SV Index:   18 LVOT Area:     3.14 cm  RIGHT VENTRICLE RV Basal diam:  3.70 cm RV Mid diam:    2.80 cm RV S prime:     17.30 cm/s LEFT ATRIUM            Index        RIGHT ATRIUM           Index LA diam:      3.90 cm  1.49 cm/m   RA Area:     14.60 cm LA Vol (A4C): 102.0 ml 39.03 ml/m  RA Volume:   35.20 ml  13.47 ml/m  AORTIC VALVE LVOT Vmax:   99.60 cm/s LVOT Vmean:  60.300 cm/s LVOT VTI:    0.152 m  AORTA Ao Root diam: 3.10 cm  SHUNTS Systemic VTI:  0.15 m Systemic Diam: 2.00 cm Jerel Croitoru MD Electronically signed by Jerel Balding MD Signature Date/Time: 06/06/2024/5:45:00 PM    Final    MR BRAIN WO CONTRAST Result Date: 06/06/2024 EXAM: MRI BRAIN WITHOUT CONTRAST 06/06/2024 04:03:35 PM TECHNIQUE: Multiplanar multisequence MRI of the head/brain was performed without the administration of intravenous contrast. COMPARISON: CT head 06/06/2024 10:52 am. CLINICAL HISTORY: Neuro deficit, acute, stroke suspected. FINDINGS: Exam is mildly degraded by motion artifact. BRAIN AND VENTRICLES: No acute infarct. No intracranial hemorrhage. No mass. No midline shift. No hydrocephalus. Mild generalized parenchymal volume loss with enlargement of the ventricles, sulci/ fissures, and cisternal spaces, slightly advanced for the patient's stated age. There is a small focus of susceptibility in the left parietal lobe which may relate to the sequela of remote microhemorrhage. There is sparse subcortical and periventricular T2 and FLAIR signal hyperintensity likely reflecting sequelae of chronic microvascular ischemia. The sella is unremarkable. Normal flow voids. ORBITS: No acute abnormality. SINUSES AND MASTOIDS: No acute abnormality. BONES AND SOFT TISSUES: Normal marrow signal. No acute soft tissue abnormality.  IMPRESSION: 1. No acute intracranial hemorrhage or acute infarction. Electronically signed by: prentice spade 06/06/2024 04:42 PM EST RP Workstation: [id number]   DG Humerus Right Result Date: 06/06/2024 CLINICAL DATA:  Pain after a fall. EXAM: RIGHT HUMERUS - 2+ VIEW COMPARISON:  None Available. FINDINGS: No evidence for an acute fracture in the humerus. Mild degenerative changes are seen in the acromioclavicular joint. No worrisome  lytic or sclerotic osseous abnormality. IMPRESSION: 1. No acute bony findings. 2. Mild degenerative changes in the acromioclavicular joint. Electronically Signed   By: Camellia Candle M.D.   On: 06/06/2024 12:13   DG Shoulder Right Portable Result Date: 06/06/2024 CLINICAL DATA:  Shoulder pain after a fall. EXAM: RIGHT SHOULDER - 1 VIEW COMPARISON:  No comparison studies available. FINDINGS: No evidence for an acute fracture. No shoulder separation or dislocation. IMPRESSION: Negative. Electronically Signed   By: Camellia Candle M.D.   On: 06/06/2024 12:13   CT CHEST ABDOMEN PELVIS W CONTRAST Result Date: 06/06/2024 EXAM: CT CHEST, ABDOMEN AND PELVIS WITH CONTRAST 06/06/2024 11:00:50 AM TECHNIQUE: CT of the chest, abdomen and pelvis was performed with the administration of 100mL iohexol  (OMNIPAQUE ) 350 MG/ML injection. Multiplanar reformatted images are provided for review. Automated exposure control, iterative reconstruction, and/or weight based adjustment of the mA/kV was utilized to reduce the radiation dose to as low as reasonably achievable. COMPARISON: CT chest, abdomen, and pelvis 04/29/2024. CLINICAL HISTORY: 63 year old male status post fall on blood thinners. FINDINGS: CHEST: MEDIASTINUM AND LYMPH NODES: Heart and pericardium are unremarkable. The central airways are clear. No mediastinal, hilar or axillary lymphadenopathy. Mild for age calcified atherosclerosis of the thoracic aorta. LUNGS AND PLEURA: Mild respiratory motion stable somewhat low lung volumes and mild  dependent atelectasis. Stable tiny left lower lobe subpleural lung nodule on series 4 image 102 (no follow up imaging recommended). No focal consolidation or pulmonary edema. No pleural effusion or pneumothorax. No acute traumatic injury identified in the imaged chest. ABDOMEN AND PELVIS: LIVER: The liver is unremarkable. GALLBLADDER AND BILE DUCTS: Gallbladder is unremarkable. No biliary ductal dilatation. SPLEEN: No acute abnormality. PANCREAS: No acute abnormality. ADRENAL GLANDS: No acute abnormality. KIDNEYS, URETERS AND BLADDER: Small benign bilateral renal cysts, the largest with simple fluid density (no follow up imaging recommended). No stones in the kidneys or ureters. No hydronephrosis. No perinephric or periureteral stranding. Urinary bladder is unremarkable. GI AND BOWEL: Mostly decompressed stomach. Redundant sigmoid colon. Nondilated bowel loops. Normal gas containing appendix on series 3 image 101. There is no bowel obstruction. REPRODUCTIVE ORGANS: No acute abnormality. PERITONEUM AND RETROPERITONEUM: No ascites. No free air. VASCULATURE: Aorta is normal in caliber. Stable mild tortuosity of the abdominal aorta and iliac arteries. Major arterial structures in the abdomen and pelvis are patent. On the delayed phase images portal venous system is grossly patent. ABDOMINAL AND PELVIS LYMPH NODES: No lymphadenopathy. BONES AND SOFT TISSUES: Intermittent bulky thoracic spine posterior element degeneration is chronic. Normal thoracic and lumbar segmentation. Stable vertebral height. Degenerative appearing ankylosis of the SI joints. Partially visible chronic left femur ORIF retained metal foreign body near the left greater trochanter. No acute osseous abnormality. No superficial soft tissue injury identified. No focal soft tissue abnormality. No acute traumatic injury identified in the visualized osseous structures and soft tissues. IMPRESSION: 1. No acute traumatic injury identified in the chest,  abdomen, and pelvis. Electronically signed by: Helayne Hurst MD 06/06/2024 11:15 AM EST RP Workstation: [id number]   CT Cervical Spine Wo Contrast Result Date: 06/06/2024 EXAM: CT CERVICAL SPINE WITHOUT CONTRAST 06/06/2024 11:00:50 AM TECHNIQUE: CT of the cervical spine was performed without the administration of intravenous contrast. Multiplanar reformatted images are provided for review. Automated exposure control, iterative reconstruction, and/or weight based adjustment of the mA/kV was utilized to reduce the radiation dose to as low as reasonably achievable. COMPARISON: None available. CLINICAL HISTORY: 63 year old male status post fall on blood thinners. FINDINGS: CERVICAL SPINE:  BONES AND ALIGNMENT: Relatively normal cervical lordosis. Normal bone mineralization for age. Mild dextroconvex cervical scoliosis. No acute fracture or traumatic malalignment. DEGENERATIVE CHANGES: Advanced chronic cervical facet degeneration on the left at C2-C3. Chronic lower cervical disc and endplate degeneration. No definite spinal stenosis by CT. SOFT TISSUES: No prevertebral soft tissue swelling. Mild for age calcified cervical carotid artery atherosclerosis. Negative visible non-contrast thoracic inlet. IMPRESSION: 1. No acute traumatic injury identified in the cervical spine. 2. Chronic cervical spine degeneration. Electronically signed by: Helayne Hurst MD 06/06/2024 11:09 AM EST RP Workstation: [id number]   CT Head Wo Contrast Result Date: 06/06/2024 EXAM: CT HEAD WITHOUT CONTRAST 06/06/2024 11:00:50 AM TECHNIQUE: CT of the head was performed without the administration of intravenous contrast. Automated exposure control, iterative reconstruction, and/or weight based adjustment of the mA/kV was utilized to reduce the radiation dose to as low as reasonably achievable. COMPARISON: None available. CLINICAL HISTORY: 63 year old male status post fall on blood thinners. FINDINGS: BRAIN AND VENTRICLES: No acute hemorrhage. No  evidence of acute infarct. No hydrocephalus. No extra-axial collection. No mass effect or midline shift. Brain volume is within normal limits for age. Calcified atherosclerosis at the skull base. Dural calcifications. Gray white differentiation is within normal limits for age. No suspicious intracranial vascular hyperdensity. ORBITS: No acute abnormality. SINUSES: Paranasal sinuses, middle ears and mastoids are well aerated. SOFT TISSUES AND SKULL: No acute soft tissue abnormality. No skull fracture. IMPRESSION: 1. No acute traumatic injury identified. 2. Normal for age non-contrast head CT. Electronically signed by: Helayne Hurst MD 06/06/2024 11:07 AM EST RP Workstation: [id number]   DG Pelvis Portable Result Date: 06/06/2024 EXAM: 1 or 2 VIEW(S) XRAY OF THE PELVIS 06/06/2024 10:01:00 AM COMPARISON: None available. CLINICAL HISTORY: pain pain FINDINGS: BONES AND JOINTS: No acute fracture. No focal osseous lesion. No joint dislocation. SOFT TISSUES: The soft tissues are unremarkable. IMPRESSION: 1. No significant abnormality. Electronically signed by: Lynwood Seip MD 06/06/2024 10:23 AM EST RP Workstation: [id number]   DG Chest Portable 1 View Result Date: 06/06/2024 EXAM: 1 VIEW(S) XRAY OF THE CHEST 06/06/2024 10:01:00 AM COMPARISON: 05/01/2024 CLINICAL HISTORY: pain pain FINDINGS: LUNGS AND PLEURA: No focal pulmonary opacity. No pulmonary edema. No pleural effusion. No pneumothorax. HEART AND MEDIASTINUM: Stable cardiomediastinal silhouette. BONES AND SOFT TISSUES: No acute osseous abnormality. IMPRESSION: 1. No acute cardiopulmonary process. Electronically signed by: Lynwood Seip MD 06/06/2024 10:20 AM EST RP Workstation: [id number]    Assessment/Plan  Impacted cerumen of left ear Impacted cerumen in the left ear was successfully flushed, removing a small piece. No pain or upper respiratory infection symptoms reported. Eardrum appears healthy with no signs of infection. - Advised against using Q-tips  to prevent further impaction. - Encouraged letting water run over the ear in the shower to help clear any remaining cerumen. - No need for ear drops as the cerumen was successfully flushed.  General Health Maintenance Lab work from November 25th shows normal thyroid levels, cholesterol levels within normal range, and good kidney and liver function. Glucose level is slightly elevated at 91, but within acceptable range. - Continue dietary modifications to reduce intake of high-fat foods. - Encouraged regular exercise to help manage glucose levels and improve cardiovascular health. - Ordered lab work prior to the next appointment on February 24th.   Family/ staff Communication: Reviewed plan of care with patient verbalized understanding   Labs/tests ordered:  - CBC with Differential/Platelet - CMP with eGFR([facility]) - TSH - Lipid panel    Next Appointment: Return in  about 3 months (around 09/15/2024) for fasting labs prior to visit.   Total time: 20 minutes. Greater than 50% of total time spent doing patient education regarding left ear cerumen Impaction health maintenance including symptom/medication management.   Roxan JAYSON Plough, NP

## 2024-06-30 ENCOUNTER — Other Ambulatory Visit: Payer: Self-pay | Admitting: Internal Medicine

## 2024-07-06 ENCOUNTER — Telehealth: Payer: Self-pay

## 2024-07-06 NOTE — Telephone Encounter (Signed)
 Another fax was received to complete a prior authorization for the Lidocaine  patches again (see phone note from Novermber 2025). Case created in covermymeds and the following message populated when attempting to do another prior authorization:

## 2024-07-07 NOTE — Progress Notes (Signed)
 ok

## 2024-07-10 ENCOUNTER — Emergency Department (HOSPITAL_COMMUNITY)

## 2024-07-10 ENCOUNTER — Encounter (HOSPITAL_COMMUNITY): Payer: Self-pay | Admitting: Emergency Medicine

## 2024-07-10 ENCOUNTER — Other Ambulatory Visit: Payer: Self-pay

## 2024-07-10 ENCOUNTER — Emergency Department (HOSPITAL_COMMUNITY)
Admission: EM | Admit: 2024-07-10 | Discharge: 2024-07-11 | Disposition: A | Discharge status: Home / Self Care | Attending: Emergency Medicine | Admitting: Emergency Medicine

## 2024-07-10 DIAGNOSIS — R040 Epistaxis: Secondary | ICD-10-CM

## 2024-07-10 DIAGNOSIS — R42 Dizziness and giddiness: Secondary | ICD-10-CM

## 2024-07-10 DIAGNOSIS — I4891 Unspecified atrial fibrillation: Secondary | ICD-10-CM | POA: Diagnosis not present

## 2024-07-10 DIAGNOSIS — Z7901 Long term (current) use of anticoagulants: Secondary | ICD-10-CM | POA: Diagnosis not present

## 2024-07-10 LAB — CBC
HCT: 46 % (ref 39.0–52.0)
Hemoglobin: 15.1 g/dL (ref 13.0–17.0)
MCH: 27.9 pg (ref 26.0–34.0)
MCHC: 32.8 g/dL (ref 30.0–36.0)
MCV: 85 fL (ref 80.0–100.0)
Platelets: 286 K/uL (ref 150–400)
RBC: 5.41 MIL/uL (ref 4.22–5.81)
RDW: 15.1 % (ref 11.5–15.5)
WBC: 7.3 K/uL (ref 4.0–10.5)
nRBC: 0 % (ref 0.0–0.2)

## 2024-07-10 LAB — URINALYSIS, ROUTINE W REFLEX MICROSCOPIC
Bacteria, UA: NONE SEEN
Bilirubin Urine: NEGATIVE
Glucose, UA: >=500 mg/dL — AB
Hgb urine dipstick: NEGATIVE
Ketones, ur: NEGATIVE mg/dL
Leukocytes,Ua: NEGATIVE
Nitrite: NEGATIVE
Protein, ur: NEGATIVE mg/dL
Specific Gravity, Urine: 1.023 (ref 1.005–1.030)
pH: 5 (ref 5.0–8.0)

## 2024-07-10 LAB — COMPREHENSIVE METABOLIC PANEL WITH GFR
ALT: 27 U/L (ref 0–44)
AST: 27 U/L (ref 15–41)
Albumin: 3.2 g/dL — ABNORMAL LOW (ref 3.5–5.0)
Alkaline Phosphatase: 74 U/L (ref 38–126)
Anion gap: 7 (ref 5–15)
BUN: 18 mg/dL (ref 8–23)
CO2: 26 mmol/L (ref 22–32)
Calcium: 8.8 mg/dL — ABNORMAL LOW (ref 8.9–10.3)
Chloride: 104 mmol/L (ref 98–111)
Creatinine, Ser: 1.29 mg/dL — ABNORMAL HIGH (ref 0.61–1.24)
GFR, Estimated: >60 mL/min (ref >60)
Glucose, Bld: 88 mg/dL (ref 70–99)
Potassium: 3.8 mmol/L (ref 3.5–5.1)
Sodium: 137 mmol/L (ref 135–145)
Total Bilirubin: 0.5 mg/dL (ref 0.0–1.2)
Total Protein: 7.2 g/dL (ref 6.5–8.1)

## 2024-07-10 LAB — TROPONIN I (HIGH SENSITIVITY)
Troponin I (High Sensitivity): 9 ng/L (ref <18)
Troponin I (High Sensitivity): 7 ng/L (ref <18)

## 2024-07-10 MED ORDER — MECLIZINE HCL 25 MG PO TABS
25.0000 mg | ORAL_TABLET | Freq: Once | ORAL | Status: AC
  Administered 2024-07-10: 18:00:00 25 mg via ORAL
  Filled 2024-07-10: qty 1

## 2024-07-10 NOTE — ED Provider Triage Note (Signed)
 Emergency Medicine Provider Triage Evaluation Note  Riley Jefferson , a 63 y.o. male  was evaluated in triage.  Pt complains of days of dizziness/lightheadedness which feels like his head is swimming.  Reports it was very bad on Saturday and has had some mild improvement.  This is worse when turning head side-to-side or with walking.  Feels off balance with walking. Also reports that he had a fall about a month ago and has been having bilateral shoulder pain since.  This is worse when moving arms above 90 degrees of flexion..  Review of Systems  Positive: CP, shoulder pain, dizziness. Negative: Fever  Physical Exam  BP (!) 141/85   Pulse 93   Temp (!) 97.4 F (36.3 C)   Resp 18   Ht 6' 4 (1.93 m)   Wt 133.8 kg   SpO2 100%   BMI 35.91 kg/m  Gen:   Awake, no distress   Resp:  Normal effort  MSK:   Moves extremities without difficulty  Other:  No focal deficit, ambulates but small steps  Medical Decision Making  Medically screening exam initiated at 6:13 PM.  Appropriate orders placed.  Dallas Raford Holmes was informed that the remainder of the evaluation will be completed by another provider, this initial triage assessment does not replace that evaluation, and the importance of remaining in the ED until their evaluation is complete.     Shermon Warren SAILOR, PA-C 07/10/24 [id number]

## 2024-07-10 NOTE — ED Triage Notes (Signed)
 PT complains of HTN, nose bleeds and lightheadedness x 3 days. Pt states when nose bleeds start it only last about 10 mins. And has bleed 2 times each day. Also complain of bilateral shoulder pain x 1 month after falling.

## 2024-07-11 ENCOUNTER — Emergency Department (HOSPITAL_COMMUNITY)

## 2024-07-11 DIAGNOSIS — R42 Dizziness and giddiness: Secondary | ICD-10-CM | POA: Diagnosis not present

## 2024-07-11 MED ORDER — OXYMETAZOLINE HCL 0.05 % NA SOLN
1.0000 | Freq: Once | NASAL | Status: AC
  Administered 2024-07-11: 06:00:00 1 via NASAL
  Filled 2024-07-11: qty 30

## 2024-07-11 NOTE — Telephone Encounter (Signed)
 PA was denied

## 2024-07-11 NOTE — ED Provider Notes (Signed)
 [place] EMERGENCY DEPARTMENT AT [facility] Provider Note   CSN: [id number] Arrival date & time: 07/10/24  1725     Patient presents with: Dizziness   Riley Jefferson is a 63 y.o. male.   The history is provided by the patient and medical records.  Dizziness Riley Jefferson is a 63 y.o. male who presents to the Emergency Department complaining of dizzy.  He presents to the emergency department for evaluation of intermittent dizziness since Saturday.  He describes it as a lightheaded sensation that comes and goes, more significant when he stands.  He also complains of global weakness that is symmetric on arms and legs.  He reports bilateral shoulder pain that started after a fall 1 month ago.  No focal neck pain.  No fevers, chest pain.  He does report intermittent nosebleeds from the right nostril that is a slow drip.  No headache.  He feels unwell in general.  He is on Eliquis  for A-fib.     Prior to Admission medications  Medication Sig Start Date End Date Taking? Authorizing Provider  apixaban  (ELIQUIS ) 5 MG TABS tablet Take 1 tablet (5 mg total) by mouth 2 (two) times daily. 06/07/24   Vann, Jessica U, DO  carbamide peroxide (DEBROX) 6.5 % OTIC solution Place 5 drops into the left ear 2 (two) times daily. Patient not taking: Reported on 06/27/2024 06/19/24   Ngetich, Dinah C, NP  empagliflozin  (JARDIANCE ) 10 MG TABS tablet Take 1 tablet (10 mg total) by mouth daily. 06/07/24   Vann, Jessica U, DO  furosemide  (LASIX ) 40 MG tablet Take 1 tablet by mouth twice daily 07/05/24   Thukkani, Arun K, MD  latanoprost  (XALATAN ) 0.005 % ophthalmic solution Place 1 drop into both eyes at bedtime. Patient not taking: Reported on 06/27/2024 06/19/24   Ngetich, Dinah C, NP  lidocaine  (LIDODERM ) 5 % Place 1 patch onto the skin daily. Remove & Discard patch within 12 hours or as directed by MD 06/19/24   Ngetich, Dinah C, NP  losartan  (COZAAR ) 25 MG tablet Take 1 tablet  (25 mg total) by mouth daily. 06/07/24   Vann, Jessica U, DO  [Paused] metoprolol  succinate (TOPROL -XL) 25 MG 24 hr tablet Take 0.5 tablets (12.5 mg total) by mouth daily. Patient not taking: Reported on 06/27/2024 Wait to take this until your doctor or other care provider tells you to start again. 10/04/23   Thukkani, Arun K, MD    Allergies: Patient has no known allergies.    Review of Systems  Neurological:  Positive for dizziness.  All other systems reviewed and are negative.   Updated Vital Signs BP (!) 149/115 (BP Location: Left Arm)   Pulse 63   Temp 97.6 F (36.4 C) (Oral)   Resp 16   Ht 6' 4 (1.93 m)   Wt 133.8 kg   SpO2 96%   BMI 35.91 kg/m   Physical Exam Vitals and nursing note reviewed.  Constitutional:      Appearance: He is well-developed.  HENT:     Head: Normocephalic and atraumatic.     Comments: No blood in nares, posterior OP. TMs obscurred by cerumen bilaterally Cardiovascular:     Rate and Rhythm: Normal rate and regular rhythm.     Heart sounds: No murmur heard. Pulmonary:     Effort: Pulmonary effort is normal. No respiratory distress.     Breath sounds: Normal breath sounds.  Abdominal:     Palpations: Abdomen is soft.  Tenderness: There is no abdominal tenderness. There is no guarding or rebound.  Musculoskeletal:        General: No tenderness.     Cervical back: No tenderness.  Skin:    General: Skin is warm and dry.  Neurological:     Mental Status: He is alert and oriented to person, place, and time.     Comments: Visual fields grossly intact, no asymmetry of facial movements.  5/5 strength in all four extremities with sensation to light touch intact in all four extremities.  Cannot elevate bilateral arms fully secondary to shoulder pain.  Slow gait.    Psychiatric:        Behavior: Behavior normal.     (all labs ordered are listed, but only abnormal results are displayed) Labs Reviewed  COMPREHENSIVE METABOLIC PANEL WITH GFR -  Abnormal; Notable for the following components:      Result Value   Creatinine, Ser 1.29 (*)    Calcium  8.8 (*)    Albumin 3.2 (*)    All other components within normal limits  URINALYSIS, ROUTINE W REFLEX MICROSCOPIC - Abnormal; Notable for the following components:   Glucose, UA >=500 (*)    All other components within normal limits  CBC  TROPONIN I (HIGH SENSITIVITY)  TROPONIN I (HIGH SENSITIVITY)    EKG: EKG Interpretation Date/Time:  Monday July 10 2024 17:44:16 EST Ventricular Rate:  78 PR Interval:    QRS Duration:  122 QT Interval:  374 QTC Calculation: 426 R Axis:   -21  Text Interpretation: Atrial fibrillation Left ventricular hypertrophy with QRS widening ( R in aVL , Cornell product ) Cannot rule out Septal infarct , age undetermined ST & T wave abnormality, consider lateral ischemia Abnormal ECG Confirmed by Griselda Norris [phone]) on 07/11/2024 12:25:19 AM  Radiology: CT Cervical Spine Wo Contrast Result Date: 07/11/2024 EXAM: CT CERVICAL SPINE WITHOUT CONTRAST 07/11/2024 05:28:03 AM TECHNIQUE: CT of the cervical spine was performed without the administration of intravenous contrast. Multiplanar reformatted images are provided for review. Automated exposure control, iterative reconstruction, and/or weight based adjustment of the mA/kV was utilized to reduce the radiation dose to as low as reasonably achievable. COMPARISON: 06/06/2024 CLINICAL HISTORY: Neck pain, acute, no red flags; fall one month ago. progressive generalized weakness for three days FINDINGS: BONES AND ALIGNMENT: No acute fracture or traumatic malalignment. DEGENERATIVE CHANGES: Chronic cervical facet degeneration is noted on the left at C2-C3. Multilevel endplate degenerative changes identified. Disc space narrowing and endplate spurring are noted at C4-C5, C5-C6, and C6-C7. SOFT TISSUES: No prevertebral soft tissue swelling. LUNG APICES: Visualized lung apices are clear. IMPRESSION: 1. No acute  findings. 2. Chronic cervical facet degeneration on the left at C2-3 and multilevel endplate degenerative changes with disc space narrowing and endplate spurring at C4-5, C5-6, and C6-7. Electronically signed by: Waddell Calk MD 07/11/2024 06:33 AM EST RP Workstation: [id number]   DG Shoulder Right Result Date: 07/10/2024 EXAM: 1 VIEW(S) XRAY OF THE SHOULDER 07/10/2024 07:06:00 PM COMPARISON: None available. CLINICAL HISTORY: Fall FINDINGS: BONES AND JOINTS: Glenohumeral joint is normally aligned. No acute fracture. No malalignment. Moderate acromioclavicular joint osteoarthritis. SOFT TISSUES: No abnormal calcifications. Visualized lung is unremarkable. IMPRESSION: 1. No acute osseous abnormality. Electronically signed by: Pinkie Pebbles MD 07/10/2024 07:29 PM EST RP Workstation: [id number]   DG Shoulder Left Result Date: 07/10/2024 EXAM: 1 VIEW(S) XRAY OF THE LEFT SHOULDER 07/10/2024 07:06:00 PM COMPARISON: None available. CLINICAL HISTORY: fall FINDINGS: BONES AND JOINTS: Glenohumeral joint is normally aligned. No  acute fracture. No malalignment. Partially visualized metallic rod in left humerus noted. Moderate osteoarthritis of acromioclavicular joint. SOFT TISSUES: No abnormal calcifications. Visualized lung is unremarkable. IMPRESSION: 1. No acute fracture or dislocation. Electronically signed by: Pinkie Pebbles MD 07/10/2024 07:28 PM EST RP Workstation: [id number]   DG Chest 2 View Result Date: 07/10/2024 EXAM: 2 VIEW(S) XRAY OF THE CHEST 07/10/2024 07:06:00 PM COMPARISON: 06/06/2024 CLINICAL HISTORY: CP FINDINGS: LUNGS AND PLEURA: No focal pulmonary opacity. No pleural effusion. No pneumothorax. HEART AND MEDIASTINUM: No acute abnormality of the cardiac and mediastinal silhouettes. BONES AND SOFT TISSUES: No acute osseous abnormality. IMPRESSION: 1. No acute cardiopulmonary process. Electronically signed by: Pinkie Pebbles MD 07/10/2024 07:20 PM EST RP Workstation: [id number]   CT Head  Wo Contrast Result Date: 07/10/2024 EXAM: CT HEAD WITHOUT 07/10/2024 06:31:00 PM TECHNIQUE: CT of the head was performed without the administration of intravenous contrast. Automated exposure control, iterative reconstruction, and/or weight based adjustment of the mA/kV was utilized to reduce the radiation dose to as low as reasonably achievable. COMPARISON: Head CT and MRI 06/06/2024. CLINICAL HISTORY: Head trauma, moderate-severe. FINDINGS: BRAIN AND VENTRICLES: There is no evidence of an acute infarct, intracranial hemorrhage, mass, midline shift, hydrocephalus, or extra-axial fluid collection. There is mild cerebral atrophy. ORBITS: No acute abnormality. SINUSES AND MASTOIDS: Mild mucosal thickening in the paranasal sinuses. Clear mastoid air cells. SOFT TISSUES AND SKULL: No acute skull fracture. No acute soft tissue abnormality. IMPRESSION: 1. No acute intracranial abnormality. Electronically signed by: Dasie Hamburg MD 07/10/2024 07:04 PM EST RP Workstation: [id number]     Procedures   Medications Ordered in the ED  meclizine  (ANTIVERT ) tablet 25 mg (25 mg Oral Given 07/10/24 1823)  oxymetazoline  (AFRIN) 0.05 % nasal spray 1 spray (1 spray Each Nare Given 07/11/24 9371)                                    Medical Decision Making Amount and/or Complexity of Data Reviewed Labs: ordered. Radiology: ordered.  Risk OTC drugs.   Pt with hx/o afib on anticoagulation here with several days of intermittent dizziness, generalized weakness and intermitent epistaxis for several days.  On exam he is nontoxic with no focal deficits.  He has pain in shoulders, which limits full ROM of bilateral shoulders.  CT head is neg for acute abnormality, CT cervical spine with chronic changes.  Current picture is not c/w CVA/TIA, SAH/SDH, acute cord compression.  Shoulder pain has been ongoing for 1 month following a fall, no acute abnormality on plain film.  Labs are unremarkable.  Current picture is not c/w  ACS.  In terms of epistaxis - hgb is stable, no active bleeding.  Discussed home care for epistaxis.  On record review pt with hx/o dizziness/gait issues in the past.  No evidence of acute infection or acute process at this time.  He is able to ambulate with slow, steady gait in ED.  Plan to discharge with outpatient follow up and return precautions.       Final diagnoses:  Dizziness  Epistaxis    ED Discharge Orders     None          Griselda Norris, MD 07/11/24 [phone]

## 2024-07-11 NOTE — Discharge Instructions (Addendum)
 Please follow-up closely with your family doctor.  You had a CT scan that did show arthritis in your neck. You may use the Afrin, 1 spray in each nostril twice daily for a maximum of 3 days.

## 2024-07-24 ENCOUNTER — Other Ambulatory Visit: Payer: Self-pay | Admitting: Internal Medicine

## 2024-07-25 ENCOUNTER — Ambulatory Visit (INDEPENDENT_AMBULATORY_CARE_PROVIDER_SITE_OTHER): Admitting: Family

## 2024-07-25 ENCOUNTER — Encounter: Payer: Self-pay | Admitting: Family

## 2024-07-25 VITALS — BP 140/82 | HR 99 | Temp 96.3°F | Ht 76.0 in | Wt 291.8 lb

## 2024-07-25 DIAGNOSIS — G8929 Other chronic pain: Secondary | ICD-10-CM | POA: Diagnosis not present

## 2024-07-25 DIAGNOSIS — H5461 Unqualified visual loss, right eye, normal vision left eye: Secondary | ICD-10-CM | POA: Diagnosis not present

## 2024-07-25 DIAGNOSIS — I5022 Chronic systolic (congestive) heart failure: Secondary | ICD-10-CM | POA: Diagnosis not present

## 2024-07-25 DIAGNOSIS — M25511 Pain in right shoulder: Secondary | ICD-10-CM

## 2024-07-25 DIAGNOSIS — R42 Dizziness and giddiness: Secondary | ICD-10-CM

## 2024-07-25 DIAGNOSIS — I10 Essential (primary) hypertension: Secondary | ICD-10-CM | POA: Diagnosis not present

## 2024-07-25 DIAGNOSIS — M25512 Pain in left shoulder: Secondary | ICD-10-CM

## 2024-07-25 DIAGNOSIS — I4821 Permanent atrial fibrillation: Secondary | ICD-10-CM

## 2024-07-25 MED ORDER — LOSARTAN POTASSIUM 50 MG PO TABS: 50.0000 mg | ORAL_TABLET | Freq: Every day | ORAL | 1 refills | Status: AC

## 2024-07-26 ENCOUNTER — Ambulatory Visit: Admitting: Podiatry

## 2024-07-26 ENCOUNTER — Ambulatory Visit: Payer: Self-pay | Admitting: Family

## 2024-07-26 DIAGNOSIS — G8929 Other chronic pain: Secondary | ICD-10-CM | POA: Insufficient documentation

## 2024-07-26 LAB — BASIC METABOLIC PANEL WITH GFR
BUN: 14 mg/dL (ref 7–25)
CO2: 24 mmol/L (ref 20–32)
Calcium: 9.3 mg/dL (ref 8.6–10.3)
Chloride: 106 mmol/L (ref 98–110)
Creat: 0.99 mg/dL (ref 0.70–1.35)
Glucose, Bld: 83 mg/dL (ref 65–99)
Potassium: 3.9 mmol/L (ref 3.5–5.3)
Sodium: 141 mmol/L (ref 135–146)
eGFR: 85 mL/min/1.73m2

## 2024-07-26 NOTE — Progress Notes (Signed)
 "  Provider: Munir Victorian FNP-C   Bethel Gaglio, Roxan BROCKS, NP  Patient Care Team: Wavie Hashimi, Roxan BROCKS, NP as PCP - General (Family Medicine) Thukkani, Arun K, MD as PCP - Cardiology (Cardiology)  Extended Emergency Contact Information Primary Emergency Contact: Cheatwood,Ebonee Mobile Phone: [phone] Relation: Daughter Secondary Emergency Contact: MARKE, GOODWYN Home Phone: [phone] Relation: None  Code Status: Full code Goals of care: Advanced Directive information    07/10/2024    5:44 PM  Advanced Directives  Does Patient Have a Medical Advance Directive? No  Would patient like information on creating a medical advance directive? No - Patient declined     Chief Complaint  Patient presents with   Hospitalization Follow-up    History of Present Illness   Riley Jefferson is a 63 year old male with atrial fibrillation who presents for a hospital follow-up after experiencing dizziness.  He was seen in the emergency room on December 15th due to dizziness, which has since resolved without further episodes. He was prescribed Meclizine  25 mg for dizziness but has not been taking it, continuing with his other medications instead.  He has a history of atrial fibrillation and is on anticoagulation therapy. Prior to the ER visit, he experienced intermittent dizziness, generalized weakness, and intermittent epistaxis for several days. He has not had any more epistaxis since then. His current medications include Jardiance , Eliquis , and Furosemide . He stopped taking Eliquis  about a week ago due to epistaxis.  He reports chronic shoulder pain, with both shoulders remaining sore, affecting his ability to lift his hands. A CT scan of the cervical spine was performed, and shoulder x-rays were also done. He sometimes uses a lidocaine  patch for back pain.  He has chronic congestive heart failure and reports shortness of breath but no leg swelling. He takes Furosemide   40 mg twice daily for this condition. He also uses Latanoprost  eye drops.  He has not been taking Metoprolol  as it was not provided by his pharmacy. He does not monitor his blood pressure at home.  He reports vision issues in his right eye, stating 'I can't see out of this eye that well,' which has been ongoing for a while. No recent chest pain.  Recent lab work showed a creatinine of 1.29 mg/dL, and calcium  and albumin levels were reported. His CBC was normal, and troponin levels were within normal limits.    Past Medical History:  Diagnosis Date   Atrial fibrillation (HCC)    CHF (congestive heart failure) (HCC)    Hypertension    Prediabetes    Past Surgical History:  Procedure Laterality Date   Bar in arm     Bar in left thigh     binign tumor right ear     screws in knee      Allergies[1]  Allergies as of 07/25/2024   No Known Allergies      Medication List        Accurate as of July 25, 2024 11:59 PM. If you have any questions, ask your nurse or doctor.          PAUSE taking these medications    metoprolol  succinate 25 MG 24 hr tablet Wait to take this until your doctor or other care provider tells you to start again. Commonly known as: TOPROL -XL Take 0.5 tablets (12.5 mg total) by mouth daily.       STOP taking these medications    Debrox 6.5 % OTIC  solution Generic drug: carbamide peroxide Stopped by: Roxan Plough, NP       TAKE these medications    Eliquis  5 MG Tabs tablet Generic drug: apixaban  Take 1 tablet (5 mg total) by mouth 2 (two) times daily.   furosemide  40 MG tablet Commonly known as: LASIX  Take 1 tablet by mouth twice daily   Jardiance  10 MG Tabs tablet Generic drug: empagliflozin  Take 1 tablet (10 mg total) by mouth daily.   latanoprost  0.005 % ophthalmic solution Commonly known as: XALATAN  Place 1 drop into both eyes at bedtime.   lidocaine  5 % Commonly known as: Lidoderm  Place 1 patch onto the skin daily.  Remove & Discard patch within 12 hours or as directed by MD What changed:  when to take this reasons to take this   losartan  50 MG tablet Commonly known as: COZAAR  Take 1 tablet (50 mg total) by mouth daily. What changed:  medication strength how much to take Changed by: Roxan Plough, NP        Review of Systems  Constitutional:  Negative for appetite change, chills, fatigue, fever and unexpected weight change.  HENT:  Negative for congestion, dental problem, ear discharge, ear pain, facial swelling, hearing loss, nosebleeds, postnasal drip, rhinorrhea, sinus pressure, sinus pain, sneezing, sore throat, tinnitus and trouble swallowing.   Eyes:  Positive for visual disturbance. Negative for pain, discharge, redness and itching.       Right eye vision changes  can't see  but has slightly improved   Respiratory:  Negative for cough, chest tightness, shortness of breath and wheezing.   Cardiovascular:  Negative for chest pain, palpitations and leg swelling.  Gastrointestinal:  Negative for abdominal distention, abdominal pain, blood in stool, constipation, diarrhea, nausea and vomiting.  Endocrine: Negative for cold intolerance, heat intolerance, polydipsia, polyphagia and polyuria.  Genitourinary:  Negative for difficulty urinating, dysuria, flank pain, frequency and urgency.  Musculoskeletal:  Positive for arthralgias. Negative for back pain, gait problem, joint swelling, myalgias, neck pain and neck stiffness.       Bilateral shoulder pain  Skin:  Negative for color change, pallor, rash and wound.  Neurological:  Negative for dizziness, syncope, speech difficulty, weakness, light-headedness, numbness and headaches.  Hematological:  Does not bruise/bleed easily.  Psychiatric/Behavioral:  Negative for agitation, behavioral problems, confusion, hallucinations, self-injury, sleep disturbance and suicidal ideas. The patient is not nervous/anxious.      There is no immunization  history on file for this patient. Pertinent  Health Maintenance Due  Topic Date Due   Colonoscopy  Never done   Influenza Vaccine  Never done      06/08/2022    5:28 PM 06/09/2022    5:13 AM 06/09/2022    5:56 PM 06/09/2022   10:15 PM 06/19/2024    3:21 PM  Fall Risk  Falls in the past year?     1  Was there an injury with Fall?     0   Fall Risk Category Calculator     2  (RETIRED) Patient Fall Risk Level Low fall risk  Low fall risk  Low fall risk  Moderate fall risk    Patient at Risk for Falls Due to     History of fall(s)  Fall risk Follow up     Falls evaluation completed     Data saved with a previous flowsheet row definition   Functional Status Survey:    Vitals:   07/25/24 1132 07/25/24 1200  BP: (!) 144/80 (!) 140/82  Pulse: 99   Temp: (!) 96.3 F (35.7 C)   SpO2: 98%   Weight: 291 lb 12.8 oz (132.4 kg)   Height: 6' 4 (1.93 m)    Body mass index is 35.52 kg/m. Physical Exam  VITALS: T- 96.3, P- 99, BP- 140/82, SaO2- 98% GENERAL: Alert, cooperative, well developed, no acute distress HEENT: Normocephalic, normal oropharynx, moist mucous membranes CHEST: Clear to auscultation bilaterally, no wheezes, rhonchi, or crackles CARDIOVASCULAR: Normal heart rate and rhythm, S1 and S2 normal without murmurs ABDOMEN: Soft, non-tender, non-distended, without organomegaly, normal bowel sounds EXTREMITIES: No cyanosis or edema NEUROLOGICAL: Cranial nerves grossly intact, moves all extremities without gross motor or sensory deficit SKIN: Normal  SKIN: No rash,no lesion or erythema   PSYCHIATRY/BEHAVIORAL: Mood stable    Labs reviewed: Recent Labs    06/07/24 0613 06/20/24 1010 07/10/24 1816 07/25/24 1221  NA  --  140 137 141  K  --  4.1 3.8 3.9  CL  --  104 104 106  CO2  --  27 26 24   GLUCOSE  --  91 88 83  BUN  --  22 18 14   CREATININE  --  1.11 1.29* 0.99  CALCIUM   --  9.7 8.8* 9.3  MG 2.1  --   --   --    Recent Labs    06/06/24 1100 06/07/24 0156  06/20/24 1010 07/10/24 1816  AST 26 23 20 27   ALT 21 23 17 27   ALKPHOS 76 74  --  74  BILITOT 1.0 0.5 0.4 0.5  PROT 7.8 7.0 7.4 7.2  ALBUMIN 3.6 3.2*  --  3.2*   Recent Labs    02/25/24 0334 04/06/24 2207 04/29/24 0041 06/06/24 0955 06/06/24 1100 07/10/24 1816  WBC 7.0   < > 7.2  --  6.1 7.3  NEUTROABS 3.4  --   --   --  3.4  --   HGB 15.5   < > 15.0 17.3* 15.8 15.1  HCT 46.3   < > 46.0 51.0 48.3 46.0  MCV 83.3   < > 83.8  --  84.6 85.0  PLT 217   < > 232  --  224 286   < > = values in this interval not displayed.   Lab Results  Component Value Date   TSH 0.58 06/20/2024   Lab Results  Component Value Date   HGBA1C 6.0 (H) 03/15/2024   Lab Results  Component Value Date   CHOL 146 06/20/2024   HDL 50 06/20/2024   LDLCALC 80 06/20/2024   TRIG 81 06/20/2024   CHOLHDL 2.9 06/20/2024    Significant Diagnostic Results in last 30 days:  CT Cervical Spine Wo Contrast Result Date: 07/11/2024 EXAM: CT CERVICAL SPINE WITHOUT CONTRAST 07/11/2024 05:28:03 AM TECHNIQUE: CT of the cervical spine was performed without the administration of intravenous contrast. Multiplanar reformatted images are provided for review. Automated exposure control, iterative reconstruction, and/or weight based adjustment of the mA/kV was utilized to reduce the radiation dose to as low as reasonably achievable. COMPARISON: 06/06/2024 CLINICAL HISTORY: Neck pain, acute, no red flags; fall one month ago. progressive generalized weakness for three days FINDINGS: BONES AND ALIGNMENT: No acute fracture or traumatic malalignment. DEGENERATIVE CHANGES: Chronic cervical facet degeneration is noted on the left at C2-C3. Multilevel endplate degenerative changes identified. Disc space narrowing and endplate spurring are noted at C4-C5, C5-C6, and C6-C7. SOFT TISSUES: No prevertebral soft tissue swelling. LUNG APICES: Visualized lung apices are clear. IMPRESSION: 1. No acute  findings. 2. Chronic cervical facet  degeneration on the left at C2-3 and multilevel endplate degenerative changes with disc space narrowing and endplate spurring at C4-5, C5-6, and C6-7. Electronically signed by: Waddell Calk MD 07/11/2024 06:33 AM EST RP Workstation: [id number]   DG Shoulder Right Result Date: 07/10/2024 EXAM: 1 VIEW(S) XRAY OF THE SHOULDER 07/10/2024 07:06:00 PM COMPARISON: None available. CLINICAL HISTORY: Fall FINDINGS: BONES AND JOINTS: Glenohumeral joint is normally aligned. No acute fracture. No malalignment. Moderate acromioclavicular joint osteoarthritis. SOFT TISSUES: No abnormal calcifications. Visualized lung is unremarkable. IMPRESSION: 1. No acute osseous abnormality. Electronically signed by: Pinkie Pebbles MD 07/10/2024 07:29 PM EST RP Workstation: [id number]   DG Shoulder Left Result Date: 07/10/2024 EXAM: 1 VIEW(S) XRAY OF THE LEFT SHOULDER 07/10/2024 07:06:00 PM COMPARISON: None available. CLINICAL HISTORY: fall FINDINGS: BONES AND JOINTS: Glenohumeral joint is normally aligned. No acute fracture. No malalignment. Partially visualized metallic rod in left humerus noted. Moderate osteoarthritis of acromioclavicular joint. SOFT TISSUES: No abnormal calcifications. Visualized lung is unremarkable. IMPRESSION: 1. No acute fracture or dislocation. Electronically signed by: Pinkie Pebbles MD 07/10/2024 07:28 PM EST RP Workstation: [id number]   DG Chest 2 View Result Date: 07/10/2024 EXAM: 2 VIEW(S) XRAY OF THE CHEST 07/10/2024 07:06:00 PM COMPARISON: 06/06/2024 CLINICAL HISTORY: CP FINDINGS: LUNGS AND PLEURA: No focal pulmonary opacity. No pleural effusion. No pneumothorax. HEART AND MEDIASTINUM: No acute abnormality of the cardiac and mediastinal silhouettes. BONES AND SOFT TISSUES: No acute osseous abnormality. IMPRESSION: 1. No acute cardiopulmonary process. Electronically signed by: Pinkie Pebbles MD 07/10/2024 07:20 PM EST RP Workstation: [id number]   CT Head Wo Contrast Result Date:  07/10/2024 EXAM: CT HEAD WITHOUT 07/10/2024 06:31:00 PM TECHNIQUE: CT of the head was performed without the administration of intravenous contrast. Automated exposure control, iterative reconstruction, and/or weight based adjustment of the mA/kV was utilized to reduce the radiation dose to as low as reasonably achievable. COMPARISON: Head CT and MRI 06/06/2024. CLINICAL HISTORY: Head trauma, moderate-severe. FINDINGS: BRAIN AND VENTRICLES: There is no evidence of an acute infarct, intracranial hemorrhage, mass, midline shift, hydrocephalus, or extra-axial fluid collection. There is mild cerebral atrophy. ORBITS: No acute abnormality. SINUSES AND MASTOIDS: Mild mucosal thickening in the paranasal sinuses. Clear mastoid air cells. SOFT TISSUES AND SKULL: No acute skull fracture. No acute soft tissue abnormality. IMPRESSION: 1. No acute intracranial abnormality. Electronically signed by: Dasie Hamburg MD 07/10/2024 07:04 PM EST RP Workstation: [id number]    Assessment/Plan  Hospital follow-up for dizziness and generalized weakness Intermittent dizziness and generalized weakness not resolved since emergency room visit. CT scan of the head and cervical spine showed no acute abnormalities. Lab work showed slightly elevated creatinine, low calcium , and low albumin. Troponin levels were normal, ruling out cardiac causes for dizziness. - Rechecked kidney function to monitor creatinine levels  Permanent atrial fibrillation Atrial fibrillation with abnormal EKG showing AFib. Heart rate elevated at 99 bpm. Not taking metoprolol  as prescribed. Eliquis  was stopped due to epistaxis but is important for preventing thromboembolic events. Discussed risks of not taking Eliquis , including potential for blood clots leading to stroke or pulmonary embolism. - Restart Eliquis  and monitor for epistaxis - Consult cardiologist regarding metoprolol  and Eliquis  dosing - Monitor heart rate and aim for below 90 bpm  Primary  hypertension Blood pressure elevated at 144/80 mmHg. Goal is to maintain blood pressure below 130/80 mmHg. No home blood pressure monitoring currently in place. - Increased losartan  to 50 mg daily - Advised purchase of home blood pressure monitor for regular monitoring  Congestive  heart failure Shortness of breath reported. No peripheral edema. Currently on furosemide  for management. - Continue furosemide  40 mg twice daily  Chronic kidney disease Slightly elevated creatinine at 1.29 mg/dL, possibly due to recent illness. Normal range is 0.6 to 1.24 mg/dL. 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 No acute changes reported.  Chronic pain, bilateral shoulders Chronic shoulder pain with soreness. X-rays showed no acute abnormalities. Pain management includes occasional use of lidocaine  patch. - Continue use of lidocaine  patch as needed for pain management   Family/ staff Communication: Reviewed plan of care with patient verbalized understanding  Labs/tests ordered: BMP  Next Appointment : Return if symptoms worsen or fail to improve.   Spent 30 minutes of Face to face and non-face to face with patient  >50% time spent counseling; reviewing medical record; tests; labs; documentation and developing future plan of care.   Roxan BROCKS Hektor Huston, NP      [1] No Known Allergies  "

## 2024-07-28 ENCOUNTER — Other Ambulatory Visit: Payer: Self-pay | Admitting: Internal Medicine

## 2024-08-30 ENCOUNTER — Encounter: Payer: Self-pay | Admitting: Internal Medicine

## 2024-08-30 ENCOUNTER — Telehealth: Payer: Self-pay | Admitting: Internal Medicine

## 2024-08-30 ENCOUNTER — Other Ambulatory Visit (HOSPITAL_COMMUNITY): Payer: Self-pay

## 2024-08-30 ENCOUNTER — Ambulatory Visit: Admitting: Internal Medicine

## 2024-08-30 VITALS — BP 140/80 | HR 66 | Ht 76.0 in | Wt 289.4 lb

## 2024-08-30 DIAGNOSIS — Z6837 Body mass index (BMI) 37.0-37.9, adult: Secondary | ICD-10-CM | POA: Diagnosis not present

## 2024-08-30 DIAGNOSIS — I480 Paroxysmal atrial fibrillation: Secondary | ICD-10-CM

## 2024-08-30 DIAGNOSIS — I5042 Chronic combined systolic (congestive) and diastolic (congestive) heart failure: Secondary | ICD-10-CM | POA: Diagnosis not present

## 2024-08-30 DIAGNOSIS — R0683 Snoring: Secondary | ICD-10-CM

## 2024-08-30 DIAGNOSIS — D6869 Other thrombophilia: Secondary | ICD-10-CM | POA: Diagnosis not present

## 2024-08-30 MED ORDER — METOPROLOL SUCCINATE ER 25 MG PO TB24
12.5000 mg | ORAL_TABLET | Freq: Every evening | ORAL | 3 refills | Status: AC
  Filled 2024-08-30: qty 45, 90d supply, fill #0

## 2024-08-30 MED ORDER — APIXABAN 5 MG PO TABS
5.0000 mg | ORAL_TABLET | Freq: Two times a day (BID) | ORAL | 3 refills | Status: AC
  Filled 2024-08-30: qty 180, 90d supply, fill #0

## 2024-08-30 MED ORDER — FUROSEMIDE 40 MG PO TABS
40.0000 mg | ORAL_TABLET | Freq: Two times a day (BID) | ORAL | 2 refills | Status: AC
  Filled 2024-08-30: qty 180, 90d supply, fill #0

## 2024-08-30 NOTE — Telephone Encounter (Signed)
 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 He will bring paperwork to overdue f/u appt today. Please advise.

## 2024-08-30 NOTE — Patient Instructions (Addendum)
 Medication Instructions:  Meds ordered this encounter  Medications   furosemide  (LASIX ) 40 MG tablet    Sig: Take 1 tablet (40 mg total) by mouth 2 (two) times daily.    Dispense:  180 tablet    Refill:  2   metoprolol  succinate (TOPROL -XL) 25 MG 24 hr tablet    Sig: Take 0.5 tablets (12.5 mg total) by mouth at bedtime.    Dispense:  45 tablet    Refill:  3   apixaban  (ELIQUIS ) 5 MG TABS tablet    Sig: Take 1 tablet (5 mg total) by mouth 2 (two) times daily.    Dispense:  180 tablet    Refill:  3    *If you need a refill on your cardiac medications before your next appointment, please call your pharmacy*   Testing/Procedures: Sleep study  Your physician has recommended that you have a sleep study. This test records several body functions during sleep, including: brain activity, eye movement, oxygen and carbon dioxide blood levels, heart rate and rhythm, breathing rate and rhythm, the flow of air through your mouth and nose, snoring, body muscle movements, and chest and belly movement.   Follow-Up: At [facility], you and your health needs are our priority.  As part of our continuing mission to provide you with exceptional heart care, our providers are all part of one team.  This team includes your primary Cardiologist (physician) and Advanced Practice Providers or APPs (Physician Assistants and Nurse Practitioners) who all work together to provide you with the care you need, when you need it.  Your next appointment:   4 week(s)  Provider:   Arun K Thukkani, MD    We recommend signing up for the patient portal called MyChart.  Sign up information is provided on this After Visit Summary.  MyChart is used to connect with patients for Virtual Visits (Telemedicine).  Patients are able to view lab/test results, encounter notes, upcoming appointments, etc.  Non-urgent messages can be sent to your provider as well.   To learn more about what you can do with MyChart, go to  [email or web address].   Other Instructions

## 2024-08-30 NOTE — Progress Notes (Signed)
 "  Cardiology Office Note:   Date:  08/30/2024  ID:  Riley Jefferson, DOB [birth year], MRN [id number] PCP:  Leonarda Roxan BROCKS, NP  [facility] Providers Cardiologist:  Wendel Haws, MD Referring MD: Leonarda Roxan BROCKS, NP  Chief Complaint/Reason for Referral:  Cardiology follow-up  ASSESSMENT:    1. Chronic combined systolic and diastolic heart failure (HCC)   2. Paroxysmal atrial fibrillation (HCC)   3. Hypercoagulable state due to paroxysmal atrial fibrillation (HCC)   4. Snoring   5. BMI 37.0-37.9, adult       PLAN:   In order of problems listed above: Systolic and diastolic heart failure: Relatively euvolemic on exam today.  Continue Jardiance  10 mg, losartan  25 mg, Lasix  40 mg twice daily.  Restart Toprol  12.5 mg at bedtime.  Will plan on repeating echocardiogram once patient has been on medications on a consistent basis. Paroxysmal atrial fibrillation: Restart Eliquis  5 mg twice daily, restart Toprol  12.5 mg at bedtime Secondary hypercoagulable state: Restart Eliquis  5 mg twice daily Snoring: STOP-BANG is 7 will obtain sleep study.  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  I explicitly told the patient he may not drive until the sleep study is back.  He understands and agrees with this plan. Elevated BMI: Diet and exercise modification.  Hemoglobin A1c was consistent with prediabetes             Dispo:  Return in about 4 weeks (around 09/27/2024).      Medication Adjustments/Labs and Tests Ordered: Current medicines are reviewed at length with the patient today.  Concerns regarding medicines are outlined above.  The following changes have been made:     Labs/tests ordered: Orders Placed This Encounter  Procedures   EKG 12-Lead   Home sleep test    Medication Changes: Meds ordered this encounter  Medications   furosemide  (LASIX ) 40 MG tablet    Sig: Take 1 tablet (40 mg total) by mouth 2 (two) times daily.    Dispense:  180 tablet    Refill:  2    metoprolol  succinate (TOPROL -XL) 25 MG 24 hr tablet    Sig: Take 1/2 tablets (12.5 mg total) by mouth at bedtime.    Dispense:  45 tablet    Refill:  3   apixaban  (ELIQUIS ) 5 MG TABS tablet    Sig: Take 1 tablet (5 mg total) by mouth 2 (two) times daily.    Dispense:  180 tablet    Refill:  3    Current medicines are reviewed at length with the patient today.  The patient does not have concerns regarding medicines.  I spent 31 minutes reviewing all clinical data during and prior to this visit including all relevant imaging studies, laboratories, clinical information from other health systems, and prior notes from both Cardiology and other specialties, interviewing the patient, and conducting a complete physical examination in order to formulate a comprehensive and personalized evaluation and treatment plan.  History of Present Illness:      FOCUSED PROBLEM LIST:   Paroxysmal atrial fibrillation Nonischemic cardiomyopathy EF 40 to 45% TTE 2023 PET stress 2022 negative [place], [place]) EF 40 to 45% TTE 2020 [facility], [place]) Prediabetes Hemoglobin A1c 6 2025 Daytime somnolence Stop bang score 7 BMI 37   January 2024: The patient is a 64 y.o. male with the indicated medical history here for cardiology follow up.  The patient had been admitted with acute on chronic combined diastolic and systolic heart failure in November.  His  troponins and BNP were normal.  He was started on lasix  20mg  and discharged.  He was seen in November in the outpatient clinic with dyspnea.  He was not able to start Farxiga or Entresto  and LCSW was contacted.  He was seen in December and was noted to be disoriented and reported breathing difficulty when stressed.   Today he tells me that he has been more short of breath of late.  He tells me his weight has been going up over the last several weeks.  He reports dyspnea with exertion and orthopnea as well as some paroxysmal nocturnal dyspnea.  He denies any presyncope or  syncope.  He has some chest heaviness at times but this is not routine.  He fortunately has not required any emergency room visits or hospitalizations.  He has been compliant with his medical therapy.   He does tell me that he snores and endorses daytime somnolence.  Plan: Start Jardiance  10 mg daily and increase Lasix  to 60 mg twice daily for 3 days then down to a new chronic dose of 40 mg twice a day with a BMP in 1 week; refer to pharmacy for elevated BMI; refer for sleep apnea evaluation given snoring.  Follow-up in 1 week.  November 2024:  The patient is seen for routine follow-up.  She was last seen in May of this year.  At that time he reported not starting Jardiance  or Entresto  as he was concerned about kidney dysfunction.  His Jardiance  and Entresto  were resumed and it was noted that his blood pressure was quite elevated.  He never got his sleep study so arrangements were made for this to occur.  He was seen in the emergency department in June due to a nosebleed.  This was thought to be due to high blood pressure and Eliquis .  ENT manage this.  The patient was discharged on his home medications with a plan to restart his Eliquis .  The patient never restarted his Eliquis .  He is having trouble getting his medications such as Jardiance  and Entresto .  He is only taking Lasix  and Toprol -XL.  He tells me that at times he is somewhat short of breath.  This does not happen all the time.  He tells me sometimes when he walks he becomes short of breath and has to stop and take a rest.  His weight has not changed.  He has no problems with orthopnea.  He has not noticed any significant peripheral edema.  Plan: Start losartan  25 mg, increase Lasix  to 40 twice daily, check BMP, check hemoglobin A1c and consider referral to pharmacy for GLP-1 receptor agonist therapy.  August 2025:  Patient consents to use of AI scribe. The patient returns for routine follow-up.  He did not did not have a hemoglobin A1c or BMP  checked after my last visit.  He did not have a sleep study performed.  He was seen in atrial fibrillation clinic in January 2025.  He was doing well referred for sleep study.  This was not performed.  He has discontinued several prescribed medications, including Eliquis , Jardiance , Lasix , losartan , and Toprol , and has been attempting to manage his condition with herbal supplements. He experiences persistent shortness of breath, which has not worsened but remains a concern. He has orthopnea, experiencing difficulty breathing when lying flat, a symptom that has been present for some time without progression. No leg swelling, palpitations, recent hospitalizations, or emergency room visits.  Plan: Restart Jardiance  10 mg, losartan  25 mg, Lasix   40 mg twice daily, restart Eliquis  5 mg twice daily, check hemoglobin A1c, follow-up in 1 month.  February 2026:  Patient consents to use of AI scribe. In the interim his hemoglobin A1c was consistent with prediabetes.  He was seen in the emergency department multiple times for neck pain and dysuria.  In November he was seen in the emergency department for syncope.  Head CT was negative.  He was discharged home.  He was most recently seen in the emergency department in December for dizziness.  His EKG demonstrated atrial fibrillation.  He was ultimately discharged home with meclizine .  He was seen by his PCP in follow-up and his losartan  was increased to 50 mg.  He experiences shortness of breath, particularly when breathing through his mouth, but otherwise breathes well. No issues with breathing while lying flat in bed. He has lost significant weight, from 399 pounds to 280 pounds, due to dietary changes including fasting, reducing beef and bread intake, and consuming more beans, salads, and smoothies.  He is currently taking Jardiance , Cozaar , and furosemide  (Lasix ) twice daily. He is not currently taking metoprolol  (Toprol ) and apixaban  (Eliquis ). He acknowledges a  previous episode of epistaxis.  He is concerned about his ability to work, particularly in driving jobs like [employer], due to the requirement of a sleep study before he can drive again. 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 He recalls a previous discussion about snoring and potential sleep apnea.        Current Medications: Current Meds  Medication Sig   empagliflozin  (JARDIANCE ) 10 MG TABS tablet Take 1 tablet (10 mg total) by mouth daily.   latanoprost  (XALATAN ) 0.005 % ophthalmic solution Place 1 drop into both eyes at bedtime.   losartan  (COZAAR ) 50 MG tablet Take 1 tablet (50 mg total) by mouth daily.   [DISCONTINUED] furosemide  (LASIX ) 40 MG tablet Take 1 tablet by mouth twice daily (Patient taking differently: Take 40 mg by mouth daily.)     Review of Systems:   Please see the history of present illness.    All other systems reviewed and are negative.     EKGs/Labs/Other Test Reviewed:   EKG: 2025 atrial fibrillation, LVH  EKG Interpretation Date/Time:  Wednesday August 30 2024 14:06:08 EST Ventricular Rate:  81 PR Interval:    QRS Duration:  120 QT Interval:  376 QTC Calculation: 436 R Axis:   -21  Text Interpretation: Atrial fibrillation Left ventricular hypertrophy with QRS widening ( R in aVL , Cornell product ) Cannot rule out Septal infarct (cited on or before 10-Jul-2024) When compared with ECG of 10-Jul-2024 17:44, No significant change was found Confirmed by Wendel Haws ([id number]) on 08/30/2024 2:07:56 PM         Risk Assessment/Calculations:    CHA2DS2-VASc Score = 2   This indicates a 2.2% annual risk of stroke. The patient's score is based upon: CHF History: 0 HTN History: 1 Diabetes History: 0 Stroke History: 0 Vascular Disease History: 0 Age Score: 1 Gender Score: 0     STOP-Bang Score:          Physical Exam:   VS:  BP (!) 140/80   Pulse 66   Ht 6' 4 (1.93 m)    Wt 289 lb 6.4 oz (131.3 kg)   SpO2 97%   BMI 35.23 kg/m    HYPERTENSION CONTROL Vitals:   08/30/24 1348 08/30/24 1420  BP: ROLLEN)  147/79 (!) 140/80    The patient's blood pressure is elevated above target today.  In order to address the patient's elevated BP: A new medication was prescribed today.      Wt Readings from Last 3 Encounters:  08/30/24 289 lb 6.4 oz (131.3 kg)  07/25/24 291 lb 12.8 oz (132.4 kg)  07/10/24 295 lb (133.8 kg)      GENERAL:  No apparent distress, AOx3 HEENT:  No carotid bruits, +2 carotid impulses, no scleral icterus CAR: Irregular RR no murmurs, gallops, rubs, or thrills RES:  Clear to auscultation bilaterally ABD:  Soft, nontender, nondistended, positive bowel sounds x 4 VASC:  +2 radial pulses, +2 carotid pulses NEURO:  CN 2-12 grossly intact; motor and sensory grossly intact PSYCH:  No active depression or anxiety EXT:  No edema, ecchymosis, or cyanosis  Signed, Kathee Tumlin K Klover Priestly, MD  08/30/2024 2:31 PM    [facility] [address] [place], [place], [place]  [address] Phone: [phone]; Fax: [phone]   Note:  This document was prepared using Dragon voice recognition software and may include unintentional dictation errors. "

## 2024-09-18 ENCOUNTER — Other Ambulatory Visit

## 2024-09-19 ENCOUNTER — Ambulatory Visit: Admitting: Family

## 2024-10-03 ENCOUNTER — Ambulatory Visit: Admitting: Internal Medicine
